# Patient Record
Sex: Female | Born: 1940 | Race: White | Hispanic: No | Smoking: Never smoker
Health system: Southern US, Community
[De-identification: ages and names within clinical notes are randomized; demographics above are authoritative.]

## PROBLEM LIST (undated history)

## (undated) DIAGNOSIS — G20A1 Parkinson's disease without dyskinesia, without mention of fluctuations: Secondary | ICD-10-CM

## (undated) DIAGNOSIS — K219 Gastro-esophageal reflux disease without esophagitis: Secondary | ICD-10-CM

## (undated) DIAGNOSIS — I10 Essential (primary) hypertension: Secondary | ICD-10-CM

## (undated) DIAGNOSIS — F0280 Dementia in other diseases classified elsewhere without behavioral disturbance: Secondary | ICD-10-CM

## (undated) DIAGNOSIS — E785 Hyperlipidemia, unspecified: Secondary | ICD-10-CM

## (undated) DIAGNOSIS — G20 Parkinson's disease: Secondary | ICD-10-CM

## (undated) DIAGNOSIS — F419 Anxiety disorder, unspecified: Secondary | ICD-10-CM

## (undated) DIAGNOSIS — I2510 Atherosclerotic heart disease of native coronary artery without angina pectoris: Secondary | ICD-10-CM

## (undated) HISTORY — DX: Parkinson's disease without dyskinesia, without mention of fluctuations: G20.A1

## (undated) HISTORY — PX: APPENDECTOMY: SHX54

## (undated) HISTORY — DX: Anxiety disorder, unspecified: F41.9

## (undated) HISTORY — DX: Parkinson's disease: G20

## (undated) HISTORY — DX: Gastro-esophageal reflux disease without esophagitis: K21.9

---

## 1961-12-11 HISTORY — PX: CHOLECYSTECTOMY: SHX55

## 1987-12-12 HISTORY — PX: OTHER SURGICAL HISTORY: SHX169

## 1998-11-19 ENCOUNTER — Ambulatory Visit (HOSPITAL_COMMUNITY): Admission: RE | Admit: 1998-11-19 | Discharge: 1998-11-19 | Payer: Self-pay | Admitting: Obstetrics & Gynecology

## 2000-06-05 ENCOUNTER — Encounter: Payer: Self-pay | Admitting: *Deleted

## 2000-06-05 ENCOUNTER — Encounter: Admission: RE | Admit: 2000-06-05 | Discharge: 2000-06-05 | Payer: Self-pay | Admitting: *Deleted

## 2000-06-29 ENCOUNTER — Encounter: Payer: Self-pay | Admitting: Emergency Medicine

## 2000-06-29 ENCOUNTER — Inpatient Hospital Stay (HOSPITAL_COMMUNITY): Admission: EM | Admit: 2000-06-29 | Discharge: 2000-07-02 | Payer: Self-pay | Admitting: Emergency Medicine

## 2000-07-02 HISTORY — PX: CARDIAC CATHETERIZATION: SHX172

## 2000-07-09 ENCOUNTER — Emergency Department (HOSPITAL_COMMUNITY): Admission: EM | Admit: 2000-07-09 | Discharge: 2000-07-09 | Payer: Self-pay | Admitting: Emergency Medicine

## 2000-07-29 ENCOUNTER — Emergency Department (HOSPITAL_COMMUNITY): Admission: EM | Admit: 2000-07-29 | Discharge: 2000-07-29 | Payer: Self-pay | Admitting: Emergency Medicine

## 2000-10-21 ENCOUNTER — Inpatient Hospital Stay (HOSPITAL_COMMUNITY): Admission: EM | Admit: 2000-10-21 | Discharge: 2000-10-24 | Payer: Self-pay | Admitting: Emergency Medicine

## 2000-10-21 ENCOUNTER — Encounter: Payer: Self-pay | Admitting: Emergency Medicine

## 2000-10-31 HISTORY — PX: CORONARY ANGIOPLASTY WITH STENT PLACEMENT: SHX49

## 2001-02-19 ENCOUNTER — Encounter: Payer: Self-pay | Admitting: Cardiovascular Disease

## 2001-02-19 ENCOUNTER — Inpatient Hospital Stay (HOSPITAL_COMMUNITY): Admission: EM | Admit: 2001-02-19 | Discharge: 2001-02-21 | Payer: Self-pay | Admitting: Emergency Medicine

## 2001-02-20 HISTORY — PX: CARDIAC CATHETERIZATION: SHX172

## 2006-06-15 ENCOUNTER — Observation Stay (HOSPITAL_COMMUNITY): Admission: EM | Admit: 2006-06-15 | Discharge: 2006-06-18 | Payer: Self-pay | Admitting: *Deleted

## 2006-06-18 HISTORY — PX: CARDIAC CATHETERIZATION: SHX172

## 2006-07-04 ENCOUNTER — Ambulatory Visit: Payer: Self-pay | Admitting: Internal Medicine

## 2006-07-05 ENCOUNTER — Ambulatory Visit: Payer: Self-pay | Admitting: Internal Medicine

## 2007-02-01 ENCOUNTER — Encounter: Admission: RE | Admit: 2007-02-01 | Discharge: 2007-02-01 | Payer: Self-pay | Admitting: Family Medicine

## 2008-03-19 ENCOUNTER — Encounter: Admission: RE | Admit: 2008-03-19 | Discharge: 2008-03-19 | Payer: Self-pay | Admitting: Family Medicine

## 2009-06-23 ENCOUNTER — Encounter: Admission: RE | Admit: 2009-06-23 | Discharge: 2009-06-23 | Payer: Self-pay | Admitting: Internal Medicine

## 2010-08-10 ENCOUNTER — Inpatient Hospital Stay (HOSPITAL_COMMUNITY): Admission: EM | Admit: 2010-08-10 | Discharge: 2010-08-10 | Payer: Self-pay | Admitting: Emergency Medicine

## 2010-08-10 HISTORY — PX: CARDIAC CATHETERIZATION: SHX172

## 2010-08-25 ENCOUNTER — Ambulatory Visit (HOSPITAL_COMMUNITY): Admission: RE | Admit: 2010-08-25 | Discharge: 2010-08-25 | Payer: Self-pay | Admitting: Internal Medicine

## 2010-12-11 HISTORY — PX: NM MYOCAR PERF WALL MOTION: HXRAD629

## 2010-12-11 HISTORY — PX: TRANSTHORACIC ECHOCARDIOGRAM: SHX275

## 2011-01-02 ENCOUNTER — Encounter
Admission: RE | Admit: 2011-01-02 | Discharge: 2011-01-02 | Payer: Self-pay | Source: Home / Self Care | Attending: Internal Medicine | Admitting: Internal Medicine

## 2011-02-23 LAB — CK TOTAL AND CKMB (NOT AT ARMC)
CK, MB: 2.9 ng/mL (ref 0.3–4.0)
Relative Index: INVALID (ref 0.0–2.5)
Total CK: 44 U/L (ref 7–177)

## 2011-02-23 LAB — HEPATIC FUNCTION PANEL
Albumin: 3.7 g/dL (ref 3.5–5.2)
Alkaline Phosphatase: 91 U/L (ref 39–117)
Total Bilirubin: 0.5 mg/dL (ref 0.3–1.2)
Total Protein: 7.3 g/dL (ref 6.0–8.3)

## 2011-02-23 LAB — TROPONIN I: Troponin I: 0.01 ng/mL (ref 0.00–0.06)

## 2011-02-23 LAB — URINE CULTURE: Culture  Setup Time: 201108310100

## 2011-02-23 LAB — URINALYSIS, ROUTINE W REFLEX MICROSCOPIC
Bilirubin Urine: NEGATIVE
Ketones, ur: NEGATIVE mg/dL
Nitrite: NEGATIVE
Specific Gravity, Urine: 1.009 (ref 1.005–1.030)
Urobilinogen, UA: 0.2 mg/dL (ref 0.0–1.0)
pH: 5.5 (ref 5.0–8.0)

## 2011-02-23 LAB — APTT: aPTT: 29 seconds (ref 24–37)

## 2011-02-23 LAB — URINE MICROSCOPIC-ADD ON

## 2011-02-23 LAB — MRSA PCR SCREENING: MRSA by PCR: NEGATIVE

## 2011-02-23 LAB — POCT CARDIAC MARKERS: CKMB, poc: 1.3 ng/mL (ref 1.0–8.0)

## 2011-02-23 LAB — BASIC METABOLIC PANEL
BUN: 13 mg/dL (ref 6–23)
CO2: 24 mEq/L (ref 19–32)
Chloride: 107 mEq/L (ref 96–112)
Creatinine, Ser: 0.83 mg/dL (ref 0.4–1.2)
GFR calc Af Amer: >60 mL/min (ref >60)
Glucose, Bld: 247 mg/dL — ABNORMAL HIGH (ref 70–99)
Potassium: 3.9 mEq/L (ref 3.5–5.1)
Sodium: 138 mEq/L (ref 135–145)

## 2011-02-23 LAB — PROTIME-INR
INR: 1 (ref 0.00–1.49)
Prothrombin Time: 13.4 seconds (ref 11.6–15.2)

## 2011-02-23 LAB — CARDIAC PANEL(CRET KIN+CKTOT+MB+TROPI)
CK, MB: 2.5 ng/mL (ref 0.3–4.0)
Relative Index: INVALID (ref 0.0–2.5)
Total CK: 50 U/L (ref 7–177)
Total CK: 54 U/L (ref 7–177)

## 2011-02-23 LAB — GLUCOSE, CAPILLARY: Glucose-Capillary: 151 mg/dL — ABNORMAL HIGH (ref 70–99)

## 2011-02-23 LAB — CBC
HCT: 35.7 % — ABNORMAL LOW (ref 36.0–46.0)
MCH: 28 pg (ref 26.0–34.0)
MCV: 86 fL (ref 78.0–100.0)
WBC: 10.2 10*3/uL (ref 4.0–10.5)

## 2011-02-23 LAB — LIPID PANEL
LDL Cholesterol: 63 mg/dL (ref 0–99)
VLDL: 18 mg/dL (ref 0–40)

## 2011-02-23 LAB — HEMOGLOBIN A1C
Hgb A1c MFr Bld: 7.1 % — ABNORMAL HIGH (ref <5.7)
Mean Plasma Glucose: 157 mg/dL — ABNORMAL HIGH (ref <117)

## 2011-04-25 ENCOUNTER — Ambulatory Visit (HOSPITAL_COMMUNITY): Payer: Medicare Other

## 2011-04-25 ENCOUNTER — Observation Stay (HOSPITAL_COMMUNITY)
Admission: AD | Admit: 2011-04-25 | Discharge: 2011-04-26 | Disposition: A | Discharge status: Home / Self Care | Payer: Medicare Other | Source: Ambulatory Visit | Attending: Orthopaedic Surgery | Admitting: Orthopaedic Surgery

## 2011-04-25 DIAGNOSIS — S61409A Unspecified open wound of unspecified hand, initial encounter: Secondary | ICD-10-CM | POA: Insufficient documentation

## 2011-04-25 DIAGNOSIS — K219 Gastro-esophageal reflux disease without esophagitis: Secondary | ICD-10-CM | POA: Insufficient documentation

## 2011-04-25 DIAGNOSIS — Y92009 Unspecified place in unspecified non-institutional (private) residence as the place of occurrence of the external cause: Secondary | ICD-10-CM | POA: Insufficient documentation

## 2011-04-25 DIAGNOSIS — L02519 Cutaneous abscess of unspecified hand: Principal | ICD-10-CM | POA: Insufficient documentation

## 2011-04-25 DIAGNOSIS — IMO0001 Reserved for inherently not codable concepts without codable children: Secondary | ICD-10-CM | POA: Insufficient documentation

## 2011-04-25 DIAGNOSIS — I10 Essential (primary) hypertension: Secondary | ICD-10-CM | POA: Insufficient documentation

## 2011-04-25 DIAGNOSIS — E669 Obesity, unspecified: Secondary | ICD-10-CM | POA: Insufficient documentation

## 2011-04-25 DIAGNOSIS — Z23 Encounter for immunization: Secondary | ICD-10-CM | POA: Insufficient documentation

## 2011-04-25 DIAGNOSIS — E119 Type 2 diabetes mellitus without complications: Secondary | ICD-10-CM | POA: Insufficient documentation

## 2011-04-25 LAB — GRAM STAIN

## 2011-04-26 LAB — GLUCOSE, CAPILLARY: Glucose-Capillary: 159 mg/dL — ABNORMAL HIGH (ref 70–99)

## 2011-04-26 LAB — BASIC METABOLIC PANEL
BUN: 10 mg/dL (ref 6–23)
Chloride: 103 mEq/L (ref 96–112)
Glucose, Bld: 155 mg/dL — ABNORMAL HIGH (ref 70–99)
Potassium: 3.7 mEq/L (ref 3.5–5.1)

## 2011-04-26 LAB — CBC
HCT: 35.3 % — ABNORMAL LOW (ref 36.0–46.0)
MCV: 85.3 fL (ref 78.0–100.0)
RDW: 14.3 % (ref 11.5–15.5)
WBC: 8.4 10*3/uL (ref 4.0–10.5)

## 2011-04-28 LAB — WOUND CULTURE: Culture: NO GROWTH

## 2011-04-28 NOTE — Cardiovascular Report (Signed)
[name], [name]                  ACCOUNT NO.:  [id number]   MEDICAL RECORD NO.:  [id number]          PATIENT TYPE:  INP   LOCATION:  3706                         FACILITY:  [facility]   PHYSICIAN:  Madaline Savage, M.D.DATE OF BIRTH:  [birth year]   DATE OF PROCEDURE:  06/18/2006  DATE OF DISCHARGE:                              CARDIAC CATHETERIZATION   PROCEDURES PERFORMED:  1.  Selective coronary angiography by Judkins technique (inpatient).  2.  Retrograde left heart catheterization.  3.  Left ventricular angiography.  4.  Successful right percutaneous femoral artery Angio-Seal closure.   ENTRY SITE:  Right femoral.   DYE USED:  Omnipaque.   CATHETERS USED:  6-French catheters.  There were three catheters used on the  right coronary artery.  The first used was a 6-French diagnostic catheter  which caused damping of pressure, then a 4-French right Judkins diagnostic  catheter caused the same event.  Finally, a 6-French guide with side holes  was able to successfully intubate the ostium without causing damping of  pressure.   PATIENT PROFILE:  [name] is a pleasant 70 year old white female patient of  Dr. Dara Hoyer and a cardiology patient of Dr. Nicki Guadalajara who is  diabetic with a history of gastroesophageal reflux disorder, mild obesity  and also has had a history of LAD stenting in 2001.  At that time a 3.0 x 15  mm NIR stent was placed in her mid-LAD just beyond septal perforator branch  #1.   Prior to this admission on June 15, 2006, the patient had had chest pain  symptomatology very suggestive of angina that prompted her to present to the  emergency room where she was admitted.  She had had a stress test in May  2005 showing an ejection fraction greater than 65% without any evidence of  ischemia, and there was trivial aortic regurgitation.  The patient does have  a history of anxiety and depression.   RESULTS:  Left ventricular pressure was 145/7, end-diastolic  pressure 15.  Central aortic pressure 145/47, mean of 95.  No aortic valve gradient by  pullback technique.   ANGIOGRAPHIC RESULTS:  The patient's coronary arterial tree showed no  evidence of coronary valvular or pericardial calcification.   Anatomically, the left main coronary artery was a long, medium-in-size blood  vessel with a smooth lumen throughout.  I would estimate the length of this  vessel to be between 12 mm and 15 mm.  The left circumflex coronary artery  is the dominant vessel of the circulation and it gives rise to a posterior  descending branch.   LAD contains a radio-opaque stent just beyond the takeoff of a septal  perforator branch.  There is no encroachment onto the septal perforator  branch by the patent stent.  The patent stent looked pristine.  There is  TIMI III flow.  There is zero evidence of any stenosis before the stent or  after the stent.  Were it not for the radiopacity of the stent, one would  not be able to tell it was sitting in the mid-LAD.  There is brisk flow into  the distal LAD and into a bifurcating diagonal branch which arises half the  way down the LAD.   Circumflex gives rise to a very large first obtuse marginal branch arising  in the proximal one-third of the circumflex, then a tiny second obtuse  marginal branch arises just before a tiny atrial circumflex branch.  A third  obtuse marginal branch arises just after an atrial circumflex branch, and  then the distal circumflex bifurcates into a posterior descending and  posterolateral branch and no lesions are seen throughout the circumflex  system.   The right coronary artery was a very small, nondominant vessel in the  circulation.  As was previously described, two diagnostic catheters (6-  [place] and 4-French) were unable to cannulate this vessel without causing  damping of pressure.  I was then able to use a right coronary artery guide  catheter, 6-French with side holes, and was  fortunately able to locate a  position that would show separate ostia for a sinus node branch and an RCA.  This RCA was small and nondominant.  There were no lesions throughout this  nondominant vessel.   Left ventricular angiography showed very mild anterolateral wall hypokinesis  with remaining segments looking normal.  I would estimate ejection fraction  at 50%.  No LV thrombus is noted.  No mitral regurgitation done.   I did a right femoral artery angiogram and it showed stable positioning of a  right femoral artery 6-French catheter for which an Angio-Seal closure was  successfully performed.   FINAL DIAGNOSES:  1.  Recent episodes of chest pain very consistent with angina.  2.  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  3.  Widely patent left anterior descending coronary artery stent with no      other new lesions seen and no evidence of a 40% inflow narrowing in this      stent.  4.  Dominant circumflex coronary artery.  5.  Small nondominant right coronary artery without lesions.  6.  Low-normal left ventricular systolic function, ejection fraction 50%.   PLAN:  Continued medical therapy and reassurance the patient.           ______________________________  Madaline Savage, M.D.     WHG/MEDQ  D:  06/18/2006  T:  06/18/2006  Job:  [id number]   cc:   Teena Irani. Arlyce Dice, M.D.  Fax: [phone]   Nicki Guadalajara, M.D.  Fax: [phone]   [facility]

## 2011-04-28 NOTE — Assessment & Plan Note (Signed)
[facility]                               PULMONARY OFFICE NOTE   NAME:[name], [name]                         MRN:          [id number]  DATE:07/04/2006                            DOB:          [birth year]    HISTORY:  A 70 year old white female with gradual onset variable dyspnea  that occurs more with stress than actually with walking, but states that it  is reproducible walking less than a block now.  Previously, she had had  dyspnea and chest discomfort that seemed to resolve after angioplasty in  2001, but with this most recent exacerbation underwent a left heart  catheterization which showed no evidence of any culprit lesions, normal LV  function, and an end-diastolic pressure of only 15 at cardiac  catheterization dated June 18, 2006.  Now is seen at Dr. Landry Dyke request for  evaluation of unexplained dyspnea.   The patient admits that she has gained weight since she used to walk with  her husband.  (Apparently, he became sick and could not walk anymore about  eight months ago.)  She stopped walking for a while, gained about 10-15  pounds, and now when she tries to do the same thing she did before she  gives out easily. She also complains of fatigue, but no orthopnea, PND, or  leg swelling, overt cough, fevers, chills, pleuritic or exertional chest  pain, or overt sinus or reflux symptoms.   PAST MEDICAL HISTORY:  1.  Diabetes.  2.  Ischemic heart disease.  3.  Hyperlipidemia.   ALLERGIES:  ASPIRIN.   MEDICATIONS:  Recorded in detail in the worksheet, dated July 04, 2006,  significant for the absence of pulmonary medicines.   SOCIAL HISTORY:  She has never smoked.  She is self-employed, with no  unusual travel, pet, or hobby exposure.   FAMILY HISTORY:  Recorded in detail, significant for the absence of  respiratory disease.   REVIEW OF SYSTEMS:  Taken in detail, and is significant only for the  dyspnea.  Note, she denies any chest pain  on this occasion.   PHYSICAL EXAMINATION:  GENERAL:  She is a slightly anxious, pleasant  ambulatory white female in no acute distress.  VITAL SIGNS:  Afebrile.  Normal vital signs.  Weight is 211 pounds.  HEENT:  Unremarkable. Oropharynx clear.  NECK:  Supple, without cervical adenopathy or tenderness.  Trachea is  midline.  Otherwise negative.  LUNG FIELDS:  Perfectly clear bilaterally to auscultation and percussion.  HEART:  Regular rate and rhythm, without murmur, gallop, rub.  ABDOMEN:  Soft benign.  EXTREMITIES:  Warm, without calf tenderness, cyanosis, clubbing, or edema.   IMPRESSION:  Dyspnea that has occurred with weight gain, with exertion.  It  is probably all related to deconditioning.  I have seen a number of patients  with just 10-15 pounds of weight gain who however have disproportionate  dyspnea related to reflux that occurs with weight gain.  I therefore gave  her free samples of Nexium to take for 6 weeks to see if this helps her  dyspnea  that occurs at rest with stress.  This brings up the issue of  whether anxiety may also be playing a role in her symptoms, but of course  would not be reproducible with exercise, where sometimes reflux-induced  dyspnea is indeed reproducible with exercise.   I did review with her also an exercise strategy that will have her walking  at a level where she is short of breath but not out of breath for up to 30  minutes daily.   I did recommend a CT scan of the chest to rule out occult pulmonary emboli,  though I think this is very unlikely based on the gradual onset and overall  pattern of her symptoms.                                   Charlaine Dalton. Sherene Sires, MD, [facility]   MBW/MedQ  DD:  07/05/2006  DT:  07/05/2006  Job #:  [id number]   cc:   Nicki Guadalajara, MD

## 2011-04-28 NOTE — Cardiovascular Report (Signed)
[place]  Patient:    [name], [name]                         MRN: [id number] Proc. Date: 07/02/00 Adm. Date:  [id number] Attending:  Berry, Jonathan [place] CC:         Lennette Bihari, M.D.             Orville Govern, At her office.             Cardiac Catheterization Laboratory                        Cardiac Catheterization  INDICATIONS:  [name] is a 70 year old, very pleasant white female, who was admitted to [facility] on Friday evening with 2-hour episode of substernal chest pressure.  She developed her chest pressure after walking on a treadmill.  For the two months previously, she has noticed a decline in exercise tolerance with exertional dyspnea.  She was admitted with a diagnosis of possible unstable angina.  She was treated with nitroglycerin, Plavix, heparin, as well as, beta-blocker therapy.  She does have an ALLERGY TO ASPIRIN.  She is now referred for definitive diagnostic catheterization.  HEMODYNAMIC DATA:  Central aortic pressure 127/56, left ventricular pressure 127/14.  ANGIOGRAPHIC DATA:  Left main coronary artery was a long, normal vessel that bifurcated into a LAD and left circumflex system.  The LAD gave rise to a very proximal first diagonal vessel.  Between the first and second diagonal vessels, there was initial narrowing of approximately 40%. Nitroglycerin was administered.  The vessel dilated significantly, but following nitroglycerin, the stenosis appeared to be approximately 50%.  There was TIMI 3 flow distally.  There were two additional diagonal vessels that were normal.  The circumflex vessel was a dominant vessel that gave rise to a small proximal marginal vessel, two major obtuse marginal vessels and ended in the posterolateral coronary artery.  The right coronary artery was a small vessel that gave rise to a small PDA system.  Biplane cineangiography revealed preserved global LV  function without focal segmental wall motion abnormality.  DISTAL AORTOGRAPHY:  Did not demonstrate any significant aortoiliac disease. There was no evidence for renal artery stenosis.  Following the catheterization, the right groin was closed successfully with the new updated Perclose device without difficulty and without complications. Excellent hemostasis was obtained.  IMPRESSION: 1. Normal LV function. 2. A 40-50% proximal, smooth LAD stenosis between the first and second    diagonal vessels. 3. Normal circumflex and right coronary artery.  RECOMMENDATION:  Medical therapy.  The patient may be a candidate for the PROVE-IT trial. DD:  07/02/00 TD:  07/02/00 Job: [id number]

## 2011-04-28 NOTE — Discharge Summary (Signed)
[place]  Patient:    [name]                          MRN: [id number] Adm. Date:  [id number] Disc. Date: [id number] Attending:  Lorre Nick Dictator:   [facility] [place], [place].A.                           Discharge Summary  ADMISSION DIAGNOSES: 1. Chest pain, unstable angina. 2. History of remote esophagitis approximately 12-15 years ago. 3. Arthritis. 4. History of hysterectomy. 5. History of cholecystectomy. 6. History of appendectomy.  DISCHARGE DIAGNOSES: 1. Chest pain, unstable angina. 2. History of remote esophagitis approximately 12-15 years ago. 3. Arthritis. 4. History of hysterectomy. 5. History of cholecystectomy. 6. History of appendectomy. 7. Status post cardiac catheterization on July 02, 2000, by Lennette Bihari,    M.D., revealing 40-50% stenosis of the mid left anterior descending,    otherwise normal coronary arteries.  HISTORY OF PRESENT ILLNESS:  [name] is a 70 year old white female with no prior cardiac history, no hypertension, no hyperlipidemia, no diabetes mellitus, who presented to the [facility] ER on June 29, 2000, with complaints of chest pain.  That morning, she had exercised for 20 minutes on her treadmill and stationary bike.  She then developed pressure in the middle of her chest and also had shortness of breath.  As well, she had increased fatigue.  She had "tingling all over."  There was no diaphoresis, no vomiting, and no vomiting.  She took no medications.  She did have some increased belching.  She had eaten a meal at approximately 7 a.m. and then began to exercise at 8:30 a.m.  She continued to have shortness of breath and chest pain until after arriving in the ER.  She stated that for the past three to four months, she had had exertional chest tightness and dyspnea on exertion.  She denied having any cardiac testing in the past.  She does have a history of esophagitis  approximately 12-15 years ago.  She states that those symptoms were very different than what she was experiencing at this time.  She states that she exercises pretty regularly and has had progressively worsening symptoms with exercise.  As well, she had had some lower extremity edema for approximately one year, but this did increase over the past two to three months.  On exam at that time, she was hemodynamically stable with a heart rate of 69, a blood pressure of 115/59.  Exam was essentially benign.  EKG revealed normal sinus rhythm, 86 beats per minute, with no ST-T wave changes.  Her initial set of enzymes at that time was negative.  At that time, [name] was planned for admission to telemetry to check serial enzymes to rule out MI.  Though she had no real cardiac risk factors and her EKG was without change and enzymes negative, it was felt that she should probably undergo cardiac catheterization given her symptomology that was consistent with crescendo angina.  Also of note, in the ER, she did have a negative spiral CT to rule out PE.  HOSPITAL COURSE:  On June 30, 2000, [name] had no further chest pain.  She was planned for cardiac catheterization on Monday.  She was continued on IV heparin, IV nitroglycerin, Plavix, and Lopressor.  Of note, she did have an allergy  to aspirin.  She states that aspirin causes her to "swell."  Again on July 01, 2000, [name] was stable with no further chest pain and was awaiting cardiac catheterization the following morning.  On July 02, 2000, [name] underwent cardiac catheterization by Lennette Bihari, M.D.  She was found to have a 40-50% proximal to mid LAD stenosis. Otherwise she had normal coronary arteries.  There was normal LV function and no PVD upon catheterization.  Later in the day on July 02, 2000, [name] was stable for discharge.  Her groin was stable.  Lennette Bihari, M.D., planned to start Zocor 20 mg q.h.s. given her lipid  profile.  As well, he planned for Toprol XL 50 mg a day.  We will give her sublingual nitroglycerin.  We plan a follow-up office visit in several weeks and add Protonix for GI prophylaxis.  CONSULTS:  None.  PROCEDURES:  Cardiac catheterization on July 02, 2000, by Lennette Bihari, M.D., revealing a 40-50% proximal to mid LAD stenosis, otherwise normal coronaries.  LABORATORY DATA:  TSH normal at 4.120.  The lipid profile revealed a total cholesterol of 209, triglycerides 126, HDL 44, and LDL 140.  Cardiac enzymes negative with CKs of 60, 35, and 40, CK-MBs 1.7, 1.5, and 1.6, and troponins less than 0.03 x 3.  The metabolic profile was normal with sodium 140, potassium 3.8, glucose 161, BUN 17, and creatinine 0.7.  Liver function tests were all normal.  The CBC was normal with WBC 8.2, hemoglobin 13.4, hematocrit 37.7, and platelets 339.  The EKG revealed a normal sinus rhythm at 70 beats per minute with no ST-T changes.  DISCHARGE MEDICATIONS: 1. Zocor 20 mg one p.o. q.h.s. 2. Toprol XL 50 mg one p.o. q.d. 3. Protonix 40 mg one p.o. q.d. 4. Nitroglycerin 0.4 mg sublingual as directed. 5. Estraderm patch.  ACTIVITY:  No strenuous activity, lifting greater than 5 pounds, driving, or sexual activity for three days.  DIET:  A low-salt, low-fat diet.  WOUND CARE:  May gently wash with the groin with warm water and soap.  Do not sit in a tub of water or get in a pool for five to seven days.  Call the office at [phone] if any bleeding or increased size or pain in the groin.  FOLLOW-UP:  Follow up in the [place], [place], office by Lennette Bihari, M.D., on September 11, 2000, at 11:30 a.m. DD:  07/16/00 TD:  07/17/00 Job: [id number]

## 2011-04-28 NOTE — Discharge Summary (Signed)
[place]. [facility]  Patient:    Andrea, ANCHONDO                         MRN: [id number] Adm. Date:  10/21/00 Disc. Date: 10/24/00 Attending:  Deneen Harts Dictator:   Halford Decamp. Delanna Ahmadi, R.N., N.P. CC:         Lennette Bihari, M.D.  Dr. Selena Lesser, M.D.   Discharge Summary  HISTORY OF PRESENT ILLNESS:   [name] is a 70 year old married female patient of Dr. Tresa Endo who came in to the ER by EMS secondary to shortness of breath symptoms.  She has had trouble for three hours, then she had burning discomfort in her chest which she never had before.  Her heart felt like it was skipping a beat, then both legs started to burn.  She denied any chest pain, chest tightness, and burning.  She stated that she had a catheterization in July with 40 to 50% blockages.  However, since that time, she has not been able to climb steps or shop at the store because of her shortness of breath symptoms.  She denies any anxiety.  Her symptoms started occurring today after vacuuming.  HOSPITAL COURSE:  She was admitted for further evaluation, possible cardiac catheterization, Protonix.  Plavix was added.  Norvasc was also added, and she was put on Lovenox.  She underwent cardiac catheterization on October 22, 2000, which showed a 65% LAD.  She only had single-vessel disease.  All other vessels were normal.  She underwent cutting balloon procedure with stent placement.  She received IV Aggrastat and heparin.  She had no complications during her procedure.  Postprocedure, she had some nausea with her sheath pull, and she was given Phenergan.  Her blood pressure also dropped.  She was asymptomatic.  Her CK-MBs elevated slightly at 43 and 3.3; troponin I was 0.15.  She got up and walked to cardiac rehabilitation, and she apparently had shortness of breath symptoms again.  Her family, specifically one of her daughters, became very aggravated and frustrated  with her mothers symptoms and demanded a further workup be done.  GI consult was called.  She was seen by Dr. Kinnie Scales.  EGD was performed which was normal.  She tolerated it without any difficulty.  He felt that she probably had symptomatic GE acid reflux without any endoscopic findings.  She does complain about a cough and some reflux symptoms.  He recommended Protonix 40 mg every day, office visit with him in a month, and that she should have antireflux methods.  Dr. Elsie Lincoln saw her on October 24, 2000.  He felt that she should be tried on Imdur every day.  She does not use nitroglycerin sublingual secondary to the fact that it bottoms out her blood pressure.  She was considered stable to be discharged home.  LABORATORY DATA:  On November 11, her WBC was 10.5, hemoglobin 14, hematocrit 41.  On November 13, her hemoglobin was 12.1, hematocrit 33, and WBC 6.9. Sodium 138, potassium 3.6, glucose 112, BUN 12, creatinine 0.7.  CK-MBs were negative x 2.  Postprocedure, she had a CK of 43, MB 3.3, and troponin 0.15, not thought to be significant.  She only had a total cholesterol drawn which was 183.  She had H. pylori done which was 1.42; it was a serum test which showed that it was positive suggestive of previous exposure or active infection.  DISCHARGE MEDICATIONS: 1. Atenolol 12.5 mg once per day. 2. Norvasc 2.5 mg once per day. 3. Protonix 40 mg once per day. 4. Plavix 75 mg once per day. 5. Effexor XR 37.5 mg once per day. 6. Vitamin E 400 international units every day. 7. Vitamin C 500 mg every day. 8. Isosorbide mononitrate 30 mg every day.  ACTIVITY:  She is to do no strenuous activity, no driving or sexual activity x 4 days.  DIET:  Low fat.  DISCHARGE INSTRUCTIONS:  If she has any problems with her groin, she will call.  FOLLOWUP:  She has an appointment on November 23 at 8:30.  She will follow up with Dr. Tresa Endo on November 26 at 3:40.  DISCHARGE DIAGNOSES: 1.  Unstable angina. 2. Atherosclerotic cardiovascular disease status post cutting balloon and    stent placement to a mid left anterior descending artery lesion.  She has    only single-vessel disease. 3. Gastroesophageal reflux disease symptoms. 4. Anxiety. 5. Hyperlipidemia. DD:  11/11/00 TD:  11/12/00 Job: [id number]

## 2011-04-28 NOTE — Cardiovascular Report (Signed)
[place]. [facility]  Patient:    [name], [name]                         MRN: [id number] Adm. Date:  [id number] Attending:  Virgina Evener CC:         Orville Govern, Office  Teena Irani. Arlyce Dice, M.D.   Cardiac Catheterization  INDICATIONS:  Ms. Betsabe Iglesia is a 70 year old white female who, on November 1, underwent PCI with stenting of her LAD.  At that time, the patient had presented with recurrent chest pain.  Prior catheterization had shown 50-60% LAD stenoses.  She developed recurrent chest pain in November leading to repeat catheterization and was found to have about a 60-70% LAD lesion.  Her symptoms improved following stenting which was done by Dr. Elsie Lincoln.  The patient had done well until recently.  For the past month, she has noticed chest burning.  Her symptoms have been somewhat atypical.  Yesterday, she was seen in the office as a walk-in add-on after experiencing tremors in the middle of the night with chest burning.  She had had recurrent episodes of chest burning throughout the day and was hospitalized last night with presumptive diagnosis of somewhat atypical but possible unstable angina.  ECG was nondiagnostic.  She is now referred for catheterization.  HEMODYNAMIC DATA:  Central aortic pressure was 105/57.  Left ventricular pressure was 105/17.  ANGIOGRAPHIC DATA: 1. The left main coronary artery was a normal vessel that bifurcated into an    LAD and dominant left circumflex system. 2. The LAD had a stent present proximally and seemed to be juxtaposed at the    takeoff of the septal perforating artery and proximal to the first diagonal    vessel.  There was a narrowing of approximately 40-50% just proximal to    this stents proximal segment.  There also was 80% narrowing in a small    septal perforating artery.  There was no significant in-stent restenosis in    the stent.  The remainder of the LAD and diagonal was angiographically  normal. 3. The circumflex was a dominant vessel, was angiographically normal and ended    in a PDA and PLA system. 4. The right coronary artery was a nondominant and normal vessel.  Bi-plane left cineangiography revealed normal LV function.  At this point, because of the patients recurrent chest pain yesterday and during the evening, it was decided to perform intravascular ultrasound to further delineate the LAD lesion and to make certain there was no significant in-stent restenosis.  Heparin, 2000 units, was administered after the patients ACT was documented to be 142.  Initially, a 6-French Q-curve was used, but this wiring the LAD was unsuccessful with this guide, and an FL3.5 left 6-French guide was used.  A short Patriot wire was advanced down the LAD An Atlantis Plus intravascular ultrasound catheter was then inserted.  An IVUS run was performed.  This revealed no significant in-stent restenosis in the 3.0- x 15-mm NIR Elite stent.  There was mild luminal intimal hyperplasia in the proximal portion of the stent, not felt to be significant.  Just proximal to the stent, there was somewhat eccentric plaque.  When this was measured in diameter, the lumen proved to be 2.8- x 2.8-mm in size.  The inner diameter of the vessel was proved to be 4.7.  The percent area of stenosis was 66%.  The percent diameter stenosis was 43%.  With  this still somewhat large residual lumen size, it was felt that this should not be flow-limiting.  The decision was made not to perform intervention but to treat the patient medically.  The arterial sheath was removed.  The patient tolerated the procedure well and returned to her room in satisfactory condition.  IMPRESSION: 1. Normal LV function. 2. Approximately 50% stenosis just proximal to the proximal 3.5- x 15-mm stent    without significant in-stent restenosis, and evidence for 80% stenosis in a    diminutive first septal perforating artery. 3. Normal  left circumflex coronary artery. 4. Normal nondominant right coronary artery.  RECOMMENDATIONS:  Medical therapy.  DD:  02/20/01 TD:  02/20/01 Job: [id number]

## 2011-04-28 NOTE — Discharge Summary (Signed)
[place]. [facility]  Patient:    [name], [name]                         MRN: [id number] Adm. Date:  [id number] Disc. Date: [id number] Attending:  Virgina Evener Dictator:   Halford Decamp Delanna Ahmadi, R.N., N.P. CC:         Teena Irani. Arlyce Dice, M.D.   Discharge Summary  Mrs. [name] is a 70 year old married white female patient of Dr.  Nicki Guadalajara with known single vessel coronary artery disease.  She is status post LAD, PCI in November 2001 by Dr. Elsie Lincoln.  She has had recurrent chest pain since then.  She had a Cardiolite on December 10, 2000 which was negative for ischemia.  Her EF was 71%.  Apparently she has been complaining about nocturnal chest tightness and burning waking her up at 2 a.m. lasting for about 30 minutes.  It is usually relieved by Xanax and half an Imdur and belching. Occasional right arm discomfort.  It is not associated with increased activity.  No presyncope.  No nausea, vomiting and no shortness of breath.  Her hands get sweaty.  These are similar symptoms prior to her angioplasty on November 2001.  She was admitted to rule out an MI.  To note, she has had a complete GI workup recently by Dr. Kinnie Scales without any abnormalities found.  She was seen by Dr. Tresa Endo in the emergency room, admitted, started on IV heparin, IV nitroglycerin and her enzymes were checked.  They apparently came back negative.  She underwent cardiac catheterization on January 23, 2001 by Dr. Nicki Guadalajara.  She had IV ultrasound performed.  She had no significant instant restenosis.  She did have some plaque just proximal to her stent anywhere from 40 to 60% stenosis. Medical treatment was recommended by Dr. Tresa Endo.  She was seen by Dr. Tresa Endo on February 21, 2001 and considered stable to be discharged home.  She is not to be on any aspirin for the known aspirin allergy.  DISCHARGE MEDICATIONS:  Norvasc 5 mg once per day.  Imdur 30 mg once per day. Prevacid 30 mg twice per  day.  Effexor 75 mg once per day.  Atenolol 12.5 mg per day.  Plavix 75 mg once per day.  She is to do no strenuous activity.  No lifting.  No driving for four days. She is to be on a low fat diet.  She will follow up with Dr. Tresa Endo on March 19, 2001.  DISCHARGE LABORATORY DATA:  Hemoglobin 11.7, hematocrit 34.2, platelets 272, WBC 5.7.  Sodium 138, potassium 3.6, glucose 115, BUN 10, creatinine 0.8.  CK MB #1 48/1.7, #2 34/1.3, troponin 0.2, #3 33/1.0 with troponin of 0.01.  TSH was 1.65.  DISCHARGE DIAGNOSES: 1. Chest pain ischemic related. 2. Arteriosclerotic cardiovascular disease status post left anterior    descending, PCI November 2001. 3. Recurrent chest pain with follow up Cardiolite December 10, 2001 with    no ischemia this admission, status post cardiac catheterization with    no significant instant restenosis with ultrasound.  She did have    40 to 60% stenosis prior to the stent in her left anterior descending.    Her other vessels have no coronary artery disease. 4. Normal ejection fraction. 5. Anxiety. 6. Depression. 7. Gastroesophageal reflux disease. 8. Normal left ventricular function. 9. ASPIRIN allergy.DD:  03/14/01 TD:  03/15/01 Job: [id number]

## 2011-04-28 NOTE — Cardiovascular Report (Signed)
[place]. [facility]  Patient:    [name], [name]                         MRN: [id number] Proc. Date: 10/22/00 Adm. Date:  [id number] Attending:  Virgina Evener CC:         Lennette Bihari, M.D.  Cath Lab   Cardiac Catheterization  PROCEDURE: 1. Intracoronary artery stent deployment into the mid left anterior descending    coronary artery. 2. Predilatation of lesion in mid-LAD with cutting balloon angioplasty. 3. Intravascular ultrasound of the left anterior descending coronary artery    before and after percutaneous intervention. 4. Selective coronary angiography by Judkins technique. 5. Retrograde left heart catheterization. 6. Left ventricular angiography.  COMPLICATIONS:  None.  ENTRY SITE:  Right femoral dye used, Omnipaque for diagnostic cath, and Hexabrix for interventional procedure.  RESULTS:  PRESSURES:  The left ventricular pressure 125/17, central aortic pressure 125/60, mean of 90, no aortic valve gradient by pullback technique.  DESCRIPTION OF PROCEDURE:  This procedure was performed by a right percutaneous femoral approach.  Cardiac catheterization was performed with a 6 French introducer sheath and the cutting balloon angioplasty portion of the procedure was also done with a 6 French sheath in place with a 6 French left Judkins guide.  The stent portion of the procedure was performed with a 7 [place] introducer sheath, a 6 French left Q curved catheter and a Patriot wire.  The procedure consisted of first performing selective coronary angiography and finding an eccentric type of plaque just beyond the septal perforator branch seen best in a 26 degree RAO projection with 13 degrees of cranial angulation.  After completion of diagnostic cardiac catheterization, intravascular ultrasound was performed.  The distal LAD appeared to be a 3.0 vessel.  The stenotic area in the mid-LAD was 2.2 to 2.6 and contained eccentric plaque at 8  oclock which was fairly dense.  The proximal LAD near the left main was a 4.0 vessel.  The IVUS tape was reviewed with Dr. Jenne Campus and he agreed that although this lesion was fairly borderline in terms of an area stenosis of 66% that based on the patients presentation and the lack of any known stress test information that we should go ahead and proceed with the intervention which was done.  The patient was given Aggrastat and weight-adjusted heparin and an ACT was confirmed at approximately 266. Although, one would predict that the LAD would be easily to steer a wire into, it turned out to be a very much a right angle turn off of the left main that required putting an exaggerated J-configuration in the tip of the guide wire. I completed a cutting balloon angioplasty with a 3.0 x 15 mm device and got a fairly good result, but when I tried to exchange that device for a stent, the entire system came dislodged.  I decided to start over with a 7 [place] system which provided much better backup support and it proved to be better at advancing the stent across the lesion.  I deployed the stent twice to 12 atmospheres corresponding to an estimated transluminal diameter of the stent of 3.3.  When I measured it with IVUS it turned out to be 3.1.  The stent was well deployed by IVUS.  The result was excellent.  There was no residual stenosis by angiography and the [place] class flow was 3 distally.  The intervention was considered a success.  Angiographically, the patient had this 65% stenosis in the mid-LAD as her only lesion.  The circumflex was dominant and no lesions were seen in the circumflex, obtuse marginal branches, or right coronary artery.  LV function was normal.  FINAL DIAGNOSES: 1. Single vessel coronary artery disease of mild degree in the mid-LAD,    60% stenotic by both angiography and by ultrasound. 2. Successful predilatation of eccentric plaque in the mid-LAD with cutting    balloon  angioplasty. 3. Successful stenting of the mid-LAD with a 3.0 x 15 mm Nir Elite stent. 4. Postprocedure IVUS showed complete and circumferential deployment of the    stent with compressed plaque at 8 oclock. DD:  10/22/00 TD:  10/22/00 Job: [id number]

## 2011-04-28 NOTE — Discharge Summary (Signed)
[name], [name] NO.:  [id number]   MEDICAL RECORD NO.:  [id number]          PATIENT TYPE:  OBV   LOCATION:  3706                         FACILITY:  [facility]   PHYSICIAN:  Nicki Guadalajara, M.D.     DATE OF BIRTH:  [birth year]   DATE OF ADMISSION:  06/15/2006  DATE OF DISCHARGE:  06/18/2006                                 DISCHARGE SUMMARY   DISCHARGE DIAGNOSES:  1.  Chest pain, consistent with angina, no restenosis of previously placed      left anterior descending stent by catheterization this admission.  2.  Non-insulin-dependent diabetes.  3.  Treated dyslipidemia.  4.  Treated hypertension.  5.  History of anxiety and depression.   HOSPITAL COURSE:  The patient is a 70 year old female presented to the  emergency room with upper chest, back and shoulder pain and chest tightness  and shortness of breath and dyspnea on exertion.  She says her symptoms were  similar to her symptoms prior to catheterization and LAD angioplasty in  2001.  She was admitted to the emergency room via EMS.  She was seen by Dr.  Jenne Campus on admission.  She was started on IV nitrates and heparin and ruled  out for an MI.  She was set up for diagnostic catheterization which was done  June 18, 2006 by Dr. Elsie Lincoln.  This revealed a normal left main patent LAD  stent that was placed in 2001, patent diagonal, patent circumflex, patent OM-  1, OM-2 and OM-3 and normal RCA nondominant with an EF of 50%.  Dr. Elsie Lincoln  did use a vascular closure device.  We feel she can be discharged later on  the 9th.  We have added Plavix and will continue this.  She is not taking  aspirin because she has a history of swelling from aspirin in the past.   DISCHARGE MEDICATIONS:  Discharge medications are atenolol 25 mg a day,  Zocor 40 mg a day, Cardizem CD 120 daily, Effexor 75 mg a day, Imdur 30 mg a  day, metformin 500 mg twice a day which she will start on July 12 in the  a.m. and fish oil before bed.   LABORATORIES:  Sodium 135, potassium 4.1, BUN 18, creatinine 0.8, white  count 10.7, hemoglobin 13.2, hematocrit 39.0, platelets 311.  D-dimer is  less than 0.22.  Troponins are negative.  TSH is 1.26.  EKG shows sinus  rhythm without acute changes.  Chest x-ray results shows a low volume with  atelectasis but no active disease.   DISPOSITION:  The patient will be discharged late on the 9th.  She will  follow up with Dr. Tresa Endo in a couple weeks as an outpatient.      Abelino Derrick, P.A.    ______________________________  Nicki Guadalajara, M.D.    Lenard Lance  D:  06/18/2006  T:  06/18/2006  Job:  [id number]

## 2011-04-29 NOTE — Op Note (Signed)
  NAMEJOYE, [name]                  ACCOUNT NO.:  [id number]  MEDICAL RECORD NO.:  [id number]           PATIENT TYPE:  O  LOCATION:  5021                         FACILITY:  [facility]  PHYSICIAN:  Vanita Panda. Magnus Ivan, M.D.DATE OF BIRTH:  10-31-1941  DATE OF PROCEDURE:  04/25/2011 DATE OF DISCHARGE:                              OPERATIVE REPORT   PREOPERATIVE DIAGNOSIS:  Left hand cellulitis and infection with abscess, status post cat bite.  POSTOPERATIVE DIAGNOSIS:  Left hand cellulitis and infection with abscess, status post cat bite.  PROCEDURE:  Irrigation and debridement of left hand superficial and deep tissues.  SURGEON:  Vanita Panda. Magnus Ivan, MD  ANESTHESIA:  General.  BLOOD LOSS:  Minimal.  ANTIBIOTICS:  600 mg IV clindamycin after cultures were obtained.  FINDINGS:  Significant edema and left hand web space.  COMPLICATIONS:  None.  INDICATIONS:  [name] is a 70 year old female who was bitten by her cat yesterday.  Over the last 4 hours, she has developed worsening redness and pain with swelling in her hand.  She had significant severe pain. Recommended she undergo irrigation and debridement of the left hand, given nature of the infection and worsening of this.  The risks and benefits of the surgery explained to her in detail and she understood the reason behind the procedure and surgery.  PROCEDURE DESCRIPTION:  After informed consent was obtained, the appropriate left hand was marked.  She was brought to the operating room and placed supine on the operating table with the left hand on an arm table.  General anesthesia was then obtained.  Her left hand was prepped and draped with DuraPrep and sterile drapes.  A time-out was called and she was identified as the correct patient and correct left hand.  I then made an incision along the thumb crease in the palm and encountered abundant edema and the serial cultures were obtained.  I am going to give her 600 mg  of IV clindamycin.  I then made an incision only dorsum of the hand as well and found abundant edema near the thumb.  Both incisions are irrigated with copious amounts of bacitracin solution followed by normal saline solution as well.  I did not open the flexor tendon sheath of the thumb, due to the fact that she had good flexion and extension.  After significant amount of fluid irrigating to all wounds superficial and deep loosely approximated the wounds within up to 3-0 nylon suture.  Xeroform followed by well-padded sterile dressing was applied.  The patient was awakened and extubated, and taken to the recovery room in stable condition.  Postoperatively, she should be admitted for IV antibiotics after the cultures were obtained as well as elevation.     Vanita Panda. Magnus Ivan, M.D.     CYB/MEDQ  D:  04/25/2011  T:  04/26/2011  Job:  [id number]  Electronically Signed by Doneen Poisson M.D. on 04/29/2011 07:18:38 PM

## 2011-04-29 NOTE — H&P (Signed)
  [name], [name]                  ACCOUNT NO.:  [id number]  MEDICAL RECORD NO.:  [id number]           PATIENT TYPE:  O  LOCATION:  5021                         FACILITY:  [facility]  PHYSICIAN:  Vanita Panda. Magnus Ivan, M.D.DATE OF BIRTH:  1941-06-09  DATE OF ADMISSION:  04/25/2011 DATE OF DISCHARGE:                             HISTORY & PHYSICAL   CHIEF COMPLAINT:  Severe left hand pain with known cat bite.  HISTORY OF PRESENT ILLNESS:  Ms. [name] is a 70 year old female who was bitten by her cat on her left hand yesterday.  This is in the palm near the thumb.  Over the last 24 hours, she has gotten worsening cellulitis and obvious infection.  We need to proceed urgently to the operating room for irrigation and debridement.  She understands the risks and benefits of this and does wish to proceed with surgery.  PAST MEDICAL HISTORY: 1. Status post stent placement in 2000. 2. Diabetes.  MEDICATIONS:  See medical reconciliation orders.  ALLERGIES:  ASPIRIN.  SOCIAL HISTORY:  She lives with her husband who is elderly and with significant medical problems.  She does not smoke, and does not drink, and her daughter is at the bedside.  REVIEW OF SYSTEMS:  Negative for chest pain, shortness of breath, fevers, chills, nausea, and vomiting.  Positive for severe right hand pain.  PHYSICAL EXAMINATION:  VITAL SIGNS:  She is afebrile.  Stable vital signs. GENERAL:  She is alert and oriented x3, in no acute distress, but obvious discomfort. HEENT:  Normocephalic and atraumatic.  Pupils are equal, round, and reactive to light.  Extraocular muscles are intact. NECK:  Supple. LUNGS:  Clear to auscultation bilaterally. HEART:  Regular rate and rhythm. ABDOMEN:  Benign. EXTREMITIES:  Left hand shows severe swelling and redness in the thumb with redness tracking into the wrist.  It obviously worsened over the last few hours since I saw her in our office.  She has severe pain at the base of  her thumb and the palm and obvious likely abscess.  ASSESSMENT:  This is a 70 year old female status post cat bite with worsening left hand infection.  PLAN:  We are going to take urgently to the operating room for irrigation and debridement, and then admitted for starting IV antibiotics.  She understands the risks and benefits of this and does understand the necessity with proceeding with surgery.     Vanita Panda. Magnus Ivan, M.D.     CYB/MEDQ  D:  04/25/2011  T:  04/26/2011  Job:  [id number]  Electronically Signed by Doneen Poisson M.D. on 04/29/2011 07:18:34 PM

## 2011-04-30 LAB — ANAEROBIC CULTURE

## 2011-05-06 NOTE — Discharge Summary (Signed)
  NAMEAIRANNA, PARTIN                  ACCOUNT NO.:  [id number]  MEDICAL RECORD NO.:  [id number]           PATIENT TYPE:  O  LOCATION:  5021                         FACILITY:  [facility]  PHYSICIAN:  Vanita Panda. Magnus Ivan, M.D.DATE OF BIRTH:  Apr 14, 1941  DATE OF ADMISSION:  04/25/2011 DATE OF DISCHARGE:  04/26/2011                              DISCHARGE SUMMARY   ADMITTING DIAGNOSES:  Cat bite to left hand with infection, cellulitis, and abscess.  DISCHARGE DIAGNOSES:  Cat bite to left hand with infection, cellulitis, and abscess.  PROCEDURES:  Irrigation and debridement of left hand cat bite.  HOSPITAL COURSE:  Ms. Tuff was taken to the operating room on the day of admission where she underwent irrigation and debridement of a left-hand infection secondary to cat bite.  For detailed description of the operation, please refer to the dictated operative notes and the patient's medical record.  She was then admitted for 24 hours of IV antibiotics.  By the next day, her symptoms was improved greatly.  Her swelling was decreased, she was tolerating old diet and it was felt we could transition her to oral antibiotics.  DISPOSITION:  Discharged to home.  DISPOSITION:  Doxycycline 100 mg p.o. b.i.d. for the next 10 days.  DISCHARGE INSTRUCTIONS:  While she is at home, she can get her incision wet.  She will work on range of motion of her fingers.  We will see her back in the office in a week.     Vanita Panda. Magnus Ivan, M.D.     CYB/MEDQ  D:  04/29/2011  T:  04/30/2011  Job:  [id number]  Electronically Signed by Doneen Poisson M.D. on 05/06/2011 11:10:16 AM

## 2011-11-01 ENCOUNTER — Emergency Department (HOSPITAL_COMMUNITY): Payer: Medicare Other

## 2011-11-01 ENCOUNTER — Other Ambulatory Visit: Payer: Self-pay

## 2011-11-01 ENCOUNTER — Observation Stay (HOSPITAL_COMMUNITY)
Admission: EM | Admit: 2011-11-01 | Discharge: 2011-11-01 | Disposition: A | Discharge status: Home / Self Care | Payer: Medicare Other | Attending: Emergency Medicine | Admitting: Emergency Medicine

## 2011-11-01 ENCOUNTER — Encounter: Payer: Self-pay | Admitting: Emergency Medicine

## 2011-11-01 DIAGNOSIS — R4701 Aphasia: Secondary | ICD-10-CM | POA: Insufficient documentation

## 2011-11-01 DIAGNOSIS — G459 Transient cerebral ischemic attack, unspecified: Secondary | ICD-10-CM

## 2011-11-01 DIAGNOSIS — Z9889 Other specified postprocedural states: Secondary | ICD-10-CM | POA: Insufficient documentation

## 2011-11-01 DIAGNOSIS — E119 Type 2 diabetes mellitus without complications: Secondary | ICD-10-CM | POA: Insufficient documentation

## 2011-11-01 DIAGNOSIS — R079 Chest pain, unspecified: Secondary | ICD-10-CM | POA: Insufficient documentation

## 2011-11-01 DIAGNOSIS — I2510 Atherosclerotic heart disease of native coronary artery without angina pectoris: Secondary | ICD-10-CM | POA: Insufficient documentation

## 2011-11-01 DIAGNOSIS — R4789 Other speech disturbances: Secondary | ICD-10-CM | POA: Insufficient documentation

## 2011-11-01 DIAGNOSIS — R259 Unspecified abnormal involuntary movements: Secondary | ICD-10-CM | POA: Insufficient documentation

## 2011-11-01 DIAGNOSIS — R0602 Shortness of breath: Secondary | ICD-10-CM | POA: Insufficient documentation

## 2011-11-01 DIAGNOSIS — Z79899 Other long term (current) drug therapy: Secondary | ICD-10-CM | POA: Insufficient documentation

## 2011-11-01 HISTORY — DX: Atherosclerotic heart disease of native coronary artery without angina pectoris: I25.10

## 2011-11-01 HISTORY — DX: Essential (primary) hypertension: I10

## 2011-11-01 HISTORY — DX: Hyperlipidemia, unspecified: E78.5

## 2011-11-01 LAB — DIFFERENTIAL
Basophils Absolute: 0 10*3/uL (ref 0.0–0.1)
Basophils Relative: 0 % (ref 0–1)
Eosinophils Absolute: 0.1 10*3/uL (ref 0.0–0.7)
Eosinophils Relative: 1 % (ref 0–5)
Monocytes Absolute: 0.9 10*3/uL (ref 0.1–1.0)
Monocytes Relative: 10 % (ref 3–12)
Neutro Abs: 6 10*3/uL (ref 1.7–7.7)

## 2011-11-01 LAB — CBC
HCT: 38.3 % (ref 36.0–46.0)
MCH: 29.4 pg (ref 26.0–34.0)
MCHC: 33.7 g/dL (ref 30.0–36.0)
RDW: 13.2 % (ref 11.5–15.5)

## 2011-11-01 LAB — LIPID PANEL
Cholesterol: 208 mg/dL — ABNORMAL HIGH (ref 0–200)
HDL: 54 mg/dL
LDL Cholesterol: 122 mg/dL — ABNORMAL HIGH (ref 0–99)
Total CHOL/HDL Ratio: 3.9 ratio
Triglycerides: 158 mg/dL — ABNORMAL HIGH
VLDL: 32 mg/dL (ref 0–40)

## 2011-11-01 LAB — POCT I-STAT TROPONIN I: Troponin i, poc: 0 ng/mL (ref 0.00–0.08)

## 2011-11-01 LAB — BASIC METABOLIC PANEL
BUN: 16 mg/dL (ref 6–23)
Calcium: 9.7 mg/dL (ref 8.4–10.5)
Chloride: 102 mEq/L (ref 96–112)
Creatinine, Ser: 0.89 mg/dL (ref 0.50–1.10)
GFR calc Af Amer: 73 mL/min — ABNORMAL LOW (ref >90)
GFR calc non Af Amer: 63 mL/min — ABNORMAL LOW (ref >90)

## 2011-11-01 LAB — GLUCOSE, CAPILLARY: Glucose-Capillary: 129 mg/dL — ABNORMAL HIGH (ref 70–99)

## 2011-11-01 LAB — URINALYSIS, ROUTINE W REFLEX MICROSCOPIC
Bilirubin Urine: NEGATIVE
Glucose, UA: NEGATIVE mg/dL
Hgb urine dipstick: NEGATIVE
Ketones, ur: NEGATIVE mg/dL
Nitrite: NEGATIVE
Protein, ur: NEGATIVE mg/dL
Specific Gravity, Urine: 1.01 (ref 1.005–1.030)
Urobilinogen, UA: 0.2 mg/dL (ref 0.0–1.0)
pH: 5.5 (ref 5.0–8.0)

## 2011-11-01 LAB — URINE MICROSCOPIC-ADD ON

## 2011-11-01 LAB — TROPONIN I: Troponin I: <0.3 ng/mL (ref <0.30)

## 2011-11-01 LAB — CARDIAC PANEL(CRET KIN+CKTOT+MB+TROPI)
CK, MB: 3.3 ng/mL (ref 0.3–4.0)
Relative Index: INVALID (ref 0.0–2.5)
Total CK: 51 U/L (ref 7–177)
Troponin I: <0.3 ng/mL

## 2011-11-01 LAB — HEMOGLOBIN A1C
Hgb A1c MFr Bld: 6.8 % — ABNORMAL HIGH
Mean Plasma Glucose: 148 mg/dL — ABNORMAL HIGH

## 2011-11-01 LAB — PROTIME-INR: Prothrombin Time: 13.5 seconds (ref 11.6–15.2)

## 2011-11-01 MED ORDER — ENOXAPARIN SODIUM 30 MG/0.3ML ~~LOC~~ SOLN: 30.0000 mg | Freq: Two times a day (BID) | SUBCUTANEOUS | Status: DC

## 2011-11-01 MED ORDER — LORAZEPAM 2 MG/ML IJ SOLN
INTRAMUSCULAR | Status: AC
  Administered 2011-11-01: 1 mg via INTRAVENOUS
  Filled 2011-11-01: qty 1

## 2011-11-01 MED ORDER — SODIUM CHLORIDE 0.9 % IV SOLN
INTRAVENOUS | Status: DC
  Administered 2011-11-01: 05:00:00 via INTRAVENOUS

## 2011-11-01 MED ORDER — STROKE: EARLY STAGES OF RECOVERY BOOK
Freq: Once | Status: AC
  Administered 2011-11-01: 10:00:00
  Filled 2011-11-01: qty 1

## 2011-11-01 MED ORDER — LORAZEPAM 2 MG/ML IJ SOLN
1.0000 mg | Freq: Once | INTRAMUSCULAR | Status: AC
  Administered 2011-11-01: 1 mg via INTRAVENOUS

## 2011-11-01 MED ORDER — DIPHENHYDRAMINE HCL 25 MG PO CAPS
25.0000 mg | ORAL_CAPSULE | Freq: Once | ORAL | Status: AC
  Administered 2011-11-01: 25 mg via ORAL
  Filled 2011-11-01: qty 1

## 2011-11-01 MED ORDER — ACETAMINOPHEN 325 MG PO TABS: 650.0000 mg | ORAL_TABLET | ORAL | Status: DC | PRN

## 2011-11-01 NOTE — ED Notes (Signed)
Meal ordered for pt 

## 2011-11-01 NOTE — Progress Notes (Signed)
  Echocardiogram 2D Echocardiogram has been performed.  Mercy Moore 11/01/2011, 9:14 AM

## 2011-11-01 NOTE — ED Provider Notes (Signed)
Pt signed out to me at the change of shift. Pt ws on TIA protocol, had an episode of chest pain. Pt is currently symptom free. Work up for TIA negative. Pt awaiting cardiology consult. Call has been made.  Cardiology seen and cleared pt to go home with appointment given to her for follow up. Pt is discharged.  Lottie Mussel, [place] 11/01/11 [id number]

## 2011-11-01 NOTE — ED Notes (Signed)
Pt returned from MRI °

## 2011-11-01 NOTE — ED Notes (Signed)
Pt and family updated on plan of care. Currently we are awaiting cardiology to call back

## 2011-11-01 NOTE — ED Notes (Signed)
Pt to ED for eval of burning in chest; pt reports that in the past week, she has been having tremors badly on L side; Dr Clelia Croft prescribed meds to help settle tremors, pt reports that also she has been "out of it" for a week, has not been able to think straight; pt reports yeseterday morning she started slurring words, and was unable to make words that she was trying to say; pt reports that when she went to sleep, she would wake up short of breath; pt denies n/v/diaphoresis, pt describes pain as burning in chest, midsternal

## 2011-11-01 NOTE — ED Provider Notes (Signed)
The patient in the CDU on TIA protocol after a period of expressive aphasia yesterday morning lasting several hours to resolve spontaneously and has not recurred since that time. Her MRI of the brain is negative for any acute findings. Her carotid Doppler study and 2-D echocardiogram are unremarkable for any findings that could preclude a TIA.  I also reviewed the note written by the cardiology nurse practitioner and will re-consult them to discuss study findings and ensure appropriate followup. The patient will be started on a statin for LDL >100. She will also be started on 325mg  ASA daily. Regarding her diabetes and Hgb A1C of 6.8, I have recommended primary physician follow-up.    3:07 PM- I have consulted [facility] and the cardiologist will see the patient in the emergency department.   Elwyn Reach [place], [place] 11/01/11 1509  As an addendum to the prior note- the patient is allergic to aspirin and has a reaction of swelling. She'll not be started on aspirin. In addition the patient reports to me that she has just started taking Zetia, 10mg . she was initially on this dyslipidemia medication but stopped taking it because her medications were too expensive. She has picked up her refill as of yesterday and has assured me she will take it as directed.  Elwyn Reach [place], [place] 11/01/11 1521

## 2011-11-01 NOTE — ED Notes (Signed)
Initiated pt education via brain attack booklet and pt's risk factors; notified pt that she will received more education as more tests are done

## 2011-11-01 NOTE — ED Notes (Signed)
pts daughter going home Albertina [phone]

## 2011-11-01 NOTE — ED Notes (Signed)
PT. REPORTS SLURRED SPEECH YESTERDAY MORNING ,  ALERT AND ORIENTED , SPEECH CLEAR ,  NO FACIAL SYMMETRY ,  EQUAL STRONG GRIPS , AMBULATORY,  ALSO REPORTS CHEST "BURNING" ONSET LAST WEEK ,  NO SOB , NO NAUSEA , NO CHEST PAIN . NO MEDS PRIOR TO ARRIVAL.

## 2011-11-01 NOTE — ED Notes (Signed)
Cards at bedside

## 2011-11-01 NOTE — ED Notes (Signed)
Pt transported for Echo. Pt alert and oriented. NAD Vitals stable. Pt ambulated from bed to wheelchair without any difficulty.

## 2011-11-01 NOTE — ED Provider Notes (Addendum)
History     CSN: [id number] Arrival date & time: 11/01/2011 12:44 AM   First MD Initiated Contact with Patient 11/01/11 0101      Chief Complaint  Patient presents with  . Aphasia    (Consider location/radiation/quality/duration/timing/severity/associated sxs/prior treatment) HPI Comments: She presents with her daughter with a complaint of shortness of breath, arm tremor bilaterally and slurred speech with expressive aphasia that occurred yesterday morning and is completely resolved. Patient states that over the last 3-4 weeks she has had bilateral arm tremor for which her family doctor has prescribed primidone. She states that this helps intermittently. Yesterday morning she developed an expressive aphasia stating that she could think in her mind what she wanted to say but the words would not come out right. Her daughter and endorses that she did have slurred speech. This resolved over the course of 3 or 4 hours and has not recurred. She denies focal weakness, numbness, focal visual changes, ataxia or headaches.   She does take Xanax for anxiety and notes that she has some improvement in her shortness of breath and chest pain with the Xanax. Her chest pain is a burning sensation that goes across her chest and occurs mostly at night. Her shortness of breath also occurs at night and wakes her frequently from sleep. She has been diagnosed with coronary disease and has a stent in one of her coronary arteries. She is treated by [facility] heart and vascular by Dr. Tresa Endo. She denies chest pain or shortness of breath at this time  The history is provided by the patient and a relative.    Past Medical History  Diagnosis Date  . Coronary artery disease   . Diabetes mellitus     Past Surgical History  Procedure Date  . Cholecystectomy   . Appendectomy   . Hysterectomy   . Coronary stent placement     No family history on file.  History  Substance Use Topics  . Smoking status: Never  Smoker   . Smokeless tobacco: Not on file  . Alcohol Use: No    OB History    Grav Para Term Preterm Abortions TAB SAB Ect Mult Living                  Review of Systems  All other systems reviewed and are negative.    Allergies  Aspirin  Home Medications   Current Outpatient Rx  Name Route Sig Dispense Refill  . METFORMIN HCL 1000 MG PO TABS Oral Take 1,000 mg by mouth 2 (two) times daily with a meal.      . PANTOPRAZOLE SODIUM 20 MG PO TBEC Oral Take 20 mg by mouth daily.      Marland Kitchen PRIMIDONE 50 MG PO TABS Oral Take 50 mg by mouth at bedtime.      . VENLAFAXINE HCL 75 MG PO CP24 Oral Take 75 mg by mouth daily.        BP 159/63  Pulse 88  Temp(Src) 98.1 F (36.7 C) (Oral)  SpO2 100%  Physical Exam  Nursing note and vitals reviewed. Constitutional: She appears well-developed and well-nourished. No distress.  HENT:  Head: Normocephalic and atraumatic.  Mouth/Throat: Oropharynx is clear and moist. No oropharyngeal exudate.  Eyes: Conjunctivae and EOM are normal. Pupils are equal, round, and reactive to light. Right eye exhibits no discharge. Left eye exhibits no discharge. No scleral icterus.  Neck: Normal range of motion. Neck supple. No JVD present. No thyromegaly present.  Cardiovascular: Normal  rate, regular rhythm, normal heart sounds and intact distal pulses.  Exam reveals no gallop and no friction rub.   No murmur heard. Pulmonary/Chest: Effort normal and breath sounds normal. No respiratory distress. She has no wheezes. She has no rales.  Abdominal: Soft. Bowel sounds are normal. She exhibits no distension and no mass. There is no tenderness.  Musculoskeletal: Normal range of motion. She exhibits no edema and no tenderness.  Lymphadenopathy:    She has no cervical adenopathy.  Neurological: She is alert. Coordination normal.       Speech clear, cranial nerves III through XII intact, no peripheral tremor, no focal weakness or numbness of the extremities. Patient is  alert and oriented x3  Skin: Skin is warm and dry. No rash noted. No erythema.  Psychiatric: She has a normal mood and affect. Her behavior is normal.    ED Course  Procedures (including critical care time)   Labs Reviewed  I-STAT TROPONIN I  CBC  DIFFERENTIAL  BASIC METABOLIC PANEL  PROTIME-INR  APTT  CK TOTAL AND CKMB  TROPONIN I   No results found.   No diagnosis found.    MDM  Cardiac exam, pulmonary exam normal, neurologic exam without focal deficits, EKG without ischemia.  History consistent with transient ischemic attack which has resolved. We'll sent for CT scan, labs, EKG.  ED ECG REPORT   Date: 11/01/2011   Rate: 88  Rhythm: normal sinus rhythm  QRS Axis: normal  Intervals: normal  ST/T Wave abnormalities: normal  Conduction Disutrbances:none  Narrative Interpretation:   Old EKG Reviewed: unchanged and Compared with EKG from 08/10/2010   With a blood pressure of 132/42, isolated aphasia, symptoms cleared after several hours she is ABCD2 #5.  She qualifies for CDU protocol TIA. I have discussed her care with the nurse practitioner for [facility] heart and vascular, who will evaluate from a chest pain standpoint though review of the medical records suggest that she had a normal cardiac catheterization in August of last year. This includes a patent stent to the left anterior descending.     Vida Roller, MD 11/01/11 0410  Change of shift - care signed out to Dr. Manus Gunning and [facility]  Vida Roller, MD 11/01/11 [phone]

## 2011-11-01 NOTE — ED Notes (Signed)
Pt has ambulated to the restroom to void

## 2011-11-01 NOTE — ED Notes (Signed)
Pt is back from MRI and placed on monitor in NSR

## 2011-11-01 NOTE — ED Notes (Signed)
Pt returned from echo

## 2011-11-01 NOTE — ED Notes (Signed)
CBG 129 

## 2011-11-01 NOTE — ED Notes (Signed)
Received phone call from MRI, that pt is anxious and requesting something to calm her nerves and claustrophobia; notified EDP and meds ordered

## 2011-11-01 NOTE — Progress Notes (Signed)
*  PRELIMINARY RESULTS*  Carotid Dopplers has been performed.  No significant ICA stenosis bilaterally. Vertebral arteries are patent with antegrade flow.  Mila Homer 11/01/2011, 9:44 AM

## 2011-11-01 NOTE — Consult Note (Signed)
Reason for Consult:CP SOB at night Referring Physician: Eber Hong, MD  [name] is an 70 y.o. female.  HPI: [name] is a pleasant 70 year old white female with history of CAD s/p stent to LAD in 2001 with DM type II, dyslipidemia and htn who presented to the ED with c/o aphasia which occurred yesterday morning and lasted for several hours before slowly going away. She has also experienced episodes of chest discomfort which usually occur at night when she lays down. She describes this as a burning sensation across her chest. Xanax does help with this discomfort. She also states that at times she awakens with SOB and the Xanax helps with this as well. Currently she is CP free and denies SOB. EKG reveals NSR without ischemic changes and initial troponin is negative.    Past Medical History  Diagnosis Date  . Coronary artery disease     Mid LAD Stent 2001 Patent By cath on 08/10/2010  . Diabetes mellitus     Type II  . Dyslipidemia   . HTN (hypertension)     Past Surgical History  Procedure Date  . Cholecystectomy   . Appendectomy   . Hysterectomy   . Coronary stent placement     No family history on file.  Social History:  reports that she has never smoked. She does not have any smokeless tobacco history on file. She reports that she does not drink alcohol or use illicit drugs.  Allergies:  Allergies  Allergen Reactions  . Aspirin     swelling    Medications: I have reviewed the patient's current medications.  Results for orders placed during the hospital encounter of 11/01/11 (from the past 48 hour(s))  CBC     Status: Normal   Collection Time   11/01/11 12:55 AM      Component Value Range Comment   WBC 9.2  4.0 - 10.5 (K/uL)    RBC 4.39  3.87 - 5.11 (MIL/uL)    Hemoglobin 12.9  12.0 - 15.0 (g/dL)    HCT 16.1  09.6 - 04.5 (%)    MCV 87.2  78.0 - 100.0 (fL)    MCH 29.4  26.0 - 34.0 (pg)    MCHC 33.7  30.0 - 36.0 (g/dL)    RDW 40.9  81.1 - 91.4 (%)    Platelets 312   150 - 400 (K/uL)   DIFFERENTIAL     Status: Normal   Collection Time   11/01/11 12:55 AM      Component Value Range Comment   Neutrophils Relative 66  43 - 77 (%)    Neutro Abs 6.0  1.7 - 7.7 (K/uL)    Lymphocytes Relative 23  12 - 46 (%)    Lymphs Abs 2.1  0.7 - 4.0 (K/uL)    Monocytes Relative 10  3 - 12 (%)    Monocytes Absolute 0.9  0.1 - 1.0 (K/uL)    Eosinophils Relative 1  0 - 5 (%)    Eosinophils Absolute 0.1  0.0 - 0.7 (K/uL)    Basophils Relative 0  0 - 1 (%)    Basophils Absolute 0.0  0.0 - 0.1 (K/uL)   BASIC METABOLIC PANEL     Status: Abnormal   Collection Time   11/01/11 12:55 AM      Component Value Range Comment   Sodium 138  135 - 145 (mEq/L)    Potassium 4.1  3.5 - 5.1 (mEq/L)    Chloride 102  96 - 112 (mEq/L)    CO2 25  19 - 32 (mEq/L)    Glucose, Bld 218 (*) 70 - 99 (mg/dL)    BUN 16  6 - 23 (mg/dL)    Creatinine, Ser 1.61  0.50 - 1.10 (mg/dL)    Calcium 9.7  8.4 - 10.5 (mg/dL)    GFR calc non Af Amer 63 (*) >90 (mL/min)    GFR calc Af Amer 73 (*) >90 (mL/min)   PROTIME-INR     Status: Normal   Collection Time   11/01/11 12:55 AM      Component Value Range Comment   Prothrombin Time 13.5  11.6 - 15.2 (seconds)    INR 1.01  0.00 - 1.49    APTT     Status: Normal   Collection Time   11/01/11 12:55 AM      Component Value Range Comment   aPTT 30  24 - 37 (seconds)   CK TOTAL AND CKMB     Status: Normal   Collection Time   11/01/11 12:55 AM      Component Value Range Comment   Total CK 63  7 - 177 (U/L)    CK, MB 3.4  0.3 - 4.0 (ng/mL)    Relative Index RELATIVE INDEX IS INVALID  0.0 - 2.5    TROPONIN I     Status: Normal   Collection Time   11/01/11 12:55 AM      Component Value Range Comment   Troponin I <0.30  <0.30 (ng/mL)   POCT I-STAT TROPONIN I     Status: Normal   Collection Time   11/01/11  1:11 AM      Component Value Range Comment   Troponin i, poc 0.00  0.00 - 0.08 (ng/mL)    Comment 3            URINALYSIS, ROUTINE W REFLEX  MICROSCOPIC     Status: Abnormal   Collection Time   11/01/11  3:58 AM      Component Value Range Comment   Color, Urine YELLOW  YELLOW     Appearance CLEAR  CLEAR     Specific Gravity, Urine 1.010  1.005 - 1.030     pH 5.5  5.0 - 8.0     Glucose, UA NEGATIVE  NEGATIVE (mg/dL)    Hgb urine dipstick NEGATIVE  NEGATIVE     Bilirubin Urine NEGATIVE  NEGATIVE     Ketones, ur NEGATIVE  NEGATIVE (mg/dL)    Protein, ur NEGATIVE  NEGATIVE (mg/dL)    Urobilinogen, UA 0.2  0.0 - 1.0 (mg/dL)    Nitrite NEGATIVE  NEGATIVE     Leukocytes, UA MODERATE (*) NEGATIVE    URINE MICROSCOPIC-ADD ON     Status: Abnormal   Collection Time   11/01/11  3:58 AM      Component Value Range Comment   Squamous Epithelial / LPF FEW (*) RARE     WBC, UA 3-6  <3 (WBC/hpf)    RBC / HPF 0-2  <3 (RBC/hpf)    Bacteria, UA RARE  RARE    LIPID PANEL     Status: Abnormal   Collection Time   11/01/11  4:13 AM      Component Value Range Comment   Cholesterol 208 (*) 0 - 200 (mg/dL)    Triglycerides 096 (*) <150 (mg/dL)    HDL 54  >04 (mg/dL)    Total CHOL/HDL Ratio 3.9      VLDL 32  0 - 40 (mg/dL)    LDL Cholesterol 161 (*) 0 - 99 (mg/dL)   CARDIAC PANEL(CRET KIN+CKTOT+MB+TROPI)     Status: Normal   Collection Time   11/01/11  4:13 AM      Component Value Range Comment   Total CK 51  7 - 177 (U/L)    CK, MB 3.3  0.3 - 4.0 (ng/mL)    Troponin I <0.30  <0.30 (ng/mL)    Relative Index RELATIVE INDEX IS INVALID  0.0 - 2.5      Dg Chest 2 View  11/01/2011  *RADIOLOGY REPORT*  Clinical Data: Chest pain.  CHEST - 2 VIEW 11/01/2011:  Comparison: Two-view chest x-ray 04/25/2011, CTA chest 08/25/2010, and portable chest x-rays 08/10/2010 and 08/09/2010 [facility].  Findings: Cardiac silhouette normal in size.  Thoracic aorta mildly atherosclerotic, unchanged.  Hilar and mediastinal contours otherwise.  Lungs clear.  Bronchovascular markings normal.  No pleural effusions.  Degenerative changes involving the  thoracic spine.  No significant interval change.  IMPRESSION: No acute cardiopulmonary disease.  Stable examination.  Original Report Authenticated By: Arnell Sieving, M.D.   Ct Head Wo Contrast  11/01/2011  *RADIOLOGY REPORT*  Clinical Data: Expressive a fascia.  CT HEAD WITHOUT CONTRAST  Technique:  Contiguous axial images were obtained from the base of the skull through the vertex without contrast.  Comparison: None.  Findings: Periventricular and subcortical white matter hypodensities are most in keeping with chronic microangiopathic change. There is no evidence for acute hemorrhage, hydrocephalus, mass lesion, or abnormal extra-axial fluid collection.  No definite CT evidence for acute infarction.  The visualized paranasal sinuses and mastoid air cells are predominately clear.  IMPRESSION: Mild white matter hypodensities, a nonspecific finding often secondary to chronic microangiopathic change.  No acute intracranial abnormality identified by CT.  Original Report Authenticated By: Waneta Martins, M.D.    Review of Systems  Constitutional: Positive for malaise/fatigue. Negative for fever and chills.  HENT: Negative for hearing loss, ear pain, nosebleeds, congestion, tinnitus and ear discharge.   Eyes: Negative for blurred vision and double vision.  Respiratory: Positive for shortness of breath. Negative for cough, hemoptysis and wheezing.   Cardiovascular: Positive for chest pain and palpitations.  Gastrointestinal: Negative for heartburn, nausea, vomiting, abdominal pain and diarrhea.  Genitourinary: Negative for dysuria, hematuria and flank pain.  Musculoskeletal: Positive for myalgias.  Skin: Negative for itching and rash.  Neurological: Positive for tremors and headaches.  Psychiatric/Behavioral: Positive for memory loss. Negative for depression, suicidal ideas, hallucinations and substance abuse. The patient is nervous/anxious.    Blood pressure 126/47, pulse 73, temperature 97.8  F (36.6 C), temperature source Oral, resp. rate 17, height 5\' 6"  (1.676 m), weight 88.905 kg (196 lb), SpO2 98.00%. Physical Exam  Constitutional: She is oriented to person, place, and time. She appears well-developed and well-nourished.  HENT:  Head: Normocephalic and atraumatic.  Eyes: Conjunctivae and EOM are normal. Pupils are equal, round, and reactive to light.  Neck: Normal range of motion. Neck supple.  Cardiovascular: Normal rate, regular rhythm and normal heart sounds.  Exam reveals no friction rub.   No murmur heard. Respiratory: Effort normal and breath sounds normal. No respiratory distress. She has no wheezes. She has no rales. She exhibits no tenderness.  GI: Soft. Bowel sounds are normal. She exhibits no distension and no mass. There is no tenderness. There is no rebound and no guarding.  Musculoskeletal: Normal range of motion. She exhibits no edema and no tenderness.  Neurological: She is alert and oriented to person, place, and time. No cranial nerve deficit.  Skin: Skin is warm and dry. No rash noted.  Psychiatric: She has a normal mood and affect.    Assessment/Plan: 1)? TIA with expressive aphasia, now resolved 2)CP 3)DM II 4)HTN 5)Dyslipidemia 6)Known CAD with patent LAD stent by cath 07/2010 7)Anxiety  Continue to cycle cardiac enzymes, review echo for wm abn. Follow up MRI, TIA workup. Further recs pending outcomes this am.  STONE,KATHERINE L 11/01/2011, 6:17 AM   Examined by me. Agree with note. Interim developments include: absence of chest "burning" recurrence, normal cardiac enzymes repeatedly, normal echo (except mild left atrial dilation), unremarkable CT and MRI. Taking low risk coronary angio last August 2011, likelihood of serious adverse CV event is low. Further w/u as outpt. (vasodilator myocardial perfusion scan scheduled for 11/09/11 and f/u with Dr. Tresa Endo on December 3). Aailyah Dunbar  4:48 PM

## 2011-11-01 NOTE — ED Provider Notes (Signed)
Medical screening examination/treatment/procedure(s) were performed by non-physician practitioner and as supervising physician I was immediately available for consultation/collaboration.  Zael Shuman K Sandrea Boer-Rasch, MD 11/01/11 2339 

## 2011-11-02 NOTE — ED Provider Notes (Signed)
Medical screening examination/treatment/procedure(s) were performed by non-physician practitioner and as supervising physician I was immediately available for consultation/collaboration.   Vida Roller, MD 11/02/11 [phone]

## 2012-01-10 DIAGNOSIS — R0609 Other forms of dyspnea: Secondary | ICD-10-CM | POA: Diagnosis not present

## 2012-01-10 DIAGNOSIS — G20 Parkinson's disease: Secondary | ICD-10-CM | POA: Diagnosis not present

## 2012-01-10 DIAGNOSIS — R0989 Other specified symptoms and signs involving the circulatory and respiratory systems: Secondary | ICD-10-CM | POA: Diagnosis not present

## 2012-01-19 DIAGNOSIS — G4733 Obstructive sleep apnea (adult) (pediatric): Secondary | ICD-10-CM | POA: Diagnosis not present

## 2012-01-26 DIAGNOSIS — I10 Essential (primary) hypertension: Secondary | ICD-10-CM | POA: Diagnosis not present

## 2012-01-26 DIAGNOSIS — E785 Hyperlipidemia, unspecified: Secondary | ICD-10-CM | POA: Diagnosis not present

## 2012-01-26 DIAGNOSIS — E1149 Type 2 diabetes mellitus with other diabetic neurological complication: Secondary | ICD-10-CM | POA: Diagnosis not present

## 2012-01-26 DIAGNOSIS — G20 Parkinson's disease: Secondary | ICD-10-CM | POA: Diagnosis not present

## 2012-02-09 DIAGNOSIS — G4733 Obstructive sleep apnea (adult) (pediatric): Secondary | ICD-10-CM | POA: Diagnosis not present

## 2012-02-11 DIAGNOSIS — IMO0002 Reserved for concepts with insufficient information to code with codable children: Secondary | ICD-10-CM | POA: Diagnosis not present

## 2012-02-23 DIAGNOSIS — E1139 Type 2 diabetes mellitus with other diabetic ophthalmic complication: Secondary | ICD-10-CM | POA: Diagnosis not present

## 2012-04-05 DIAGNOSIS — G20 Parkinson's disease: Secondary | ICD-10-CM | POA: Diagnosis not present

## 2012-04-05 DIAGNOSIS — G4714 Hypersomnia due to medical condition: Secondary | ICD-10-CM | POA: Diagnosis not present

## 2012-04-05 DIAGNOSIS — G4733 Obstructive sleep apnea (adult) (pediatric): Secondary | ICD-10-CM | POA: Diagnosis not present

## 2012-05-02 DIAGNOSIS — E785 Hyperlipidemia, unspecified: Secondary | ICD-10-CM | POA: Diagnosis not present

## 2012-05-02 DIAGNOSIS — I10 Essential (primary) hypertension: Secondary | ICD-10-CM | POA: Diagnosis not present

## 2012-05-02 DIAGNOSIS — E1149 Type 2 diabetes mellitus with other diabetic neurological complication: Secondary | ICD-10-CM | POA: Diagnosis not present

## 2012-05-09 DIAGNOSIS — E559 Vitamin D deficiency, unspecified: Secondary | ICD-10-CM | POA: Diagnosis not present

## 2012-05-09 DIAGNOSIS — E1149 Type 2 diabetes mellitus with other diabetic neurological complication: Secondary | ICD-10-CM | POA: Diagnosis not present

## 2012-05-09 DIAGNOSIS — Z0000 Encounter for general adult medical examination without abnormal findings: Secondary | ICD-10-CM | POA: Diagnosis not present

## 2012-05-09 DIAGNOSIS — I10 Essential (primary) hypertension: Secondary | ICD-10-CM | POA: Diagnosis not present

## 2012-05-09 DIAGNOSIS — R82998 Other abnormal findings in urine: Secondary | ICD-10-CM | POA: Diagnosis not present

## 2012-05-09 DIAGNOSIS — E785 Hyperlipidemia, unspecified: Secondary | ICD-10-CM | POA: Diagnosis not present

## 2012-05-13 DIAGNOSIS — G20 Parkinson's disease: Secondary | ICD-10-CM | POA: Diagnosis not present

## 2012-05-13 DIAGNOSIS — R413 Other amnesia: Secondary | ICD-10-CM | POA: Diagnosis not present

## 2012-05-13 DIAGNOSIS — Z1212 Encounter for screening for malignant neoplasm of rectum: Secondary | ICD-10-CM | POA: Diagnosis not present

## 2012-06-24 ENCOUNTER — Ambulatory Visit
Admission: RE | Admit: 2012-06-24 | Discharge: 2012-06-24 | Disposition: A | Discharge status: Home / Self Care | Payer: Medicare Other | Source: Ambulatory Visit | Attending: Diagnostic Neuroimaging | Admitting: Diagnostic Neuroimaging

## 2012-06-24 ENCOUNTER — Other Ambulatory Visit: Payer: Self-pay | Admitting: Diagnostic Neuroimaging

## 2012-06-24 DIAGNOSIS — R609 Edema, unspecified: Secondary | ICD-10-CM | POA: Diagnosis not present

## 2012-06-24 DIAGNOSIS — G20 Parkinson's disease: Secondary | ICD-10-CM | POA: Diagnosis not present

## 2012-06-24 DIAGNOSIS — M79609 Pain in unspecified limb: Secondary | ICD-10-CM | POA: Diagnosis not present

## 2012-07-24 DIAGNOSIS — M25569 Pain in unspecified knee: Secondary | ICD-10-CM | POA: Diagnosis not present

## 2012-08-01 DIAGNOSIS — M25569 Pain in unspecified knee: Secondary | ICD-10-CM | POA: Diagnosis not present

## 2012-08-05 DIAGNOSIS — E119 Type 2 diabetes mellitus without complications: Secondary | ICD-10-CM | POA: Diagnosis not present

## 2012-08-05 DIAGNOSIS — I119 Hypertensive heart disease without heart failure: Secondary | ICD-10-CM | POA: Diagnosis not present

## 2012-08-05 DIAGNOSIS — I2510 Atherosclerotic heart disease of native coronary artery without angina pectoris: Secondary | ICD-10-CM | POA: Diagnosis not present

## 2012-08-05 DIAGNOSIS — E782 Mixed hyperlipidemia: Secondary | ICD-10-CM | POA: Diagnosis not present

## 2012-09-16 DIAGNOSIS — I259 Chronic ischemic heart disease, unspecified: Secondary | ICD-10-CM | POA: Diagnosis not present

## 2012-09-16 DIAGNOSIS — E785 Hyperlipidemia, unspecified: Secondary | ICD-10-CM | POA: Diagnosis not present

## 2012-09-16 DIAGNOSIS — F0280 Dementia in other diseases classified elsewhere without behavioral disturbance: Secondary | ICD-10-CM | POA: Diagnosis not present

## 2012-09-16 DIAGNOSIS — E1149 Type 2 diabetes mellitus with other diabetic neurological complication: Secondary | ICD-10-CM | POA: Diagnosis not present

## 2012-09-16 DIAGNOSIS — Z23 Encounter for immunization: Secondary | ICD-10-CM | POA: Diagnosis not present

## 2012-09-16 DIAGNOSIS — I10 Essential (primary) hypertension: Secondary | ICD-10-CM | POA: Diagnosis not present

## 2012-09-16 DIAGNOSIS — G3183 Dementia with Lewy bodies: Secondary | ICD-10-CM | POA: Diagnosis not present

## 2012-10-07 DIAGNOSIS — R82998 Other abnormal findings in urine: Secondary | ICD-10-CM | POA: Diagnosis not present

## 2012-10-07 DIAGNOSIS — I10 Essential (primary) hypertension: Secondary | ICD-10-CM | POA: Diagnosis not present

## 2012-10-07 DIAGNOSIS — N390 Urinary tract infection, site not specified: Secondary | ICD-10-CM | POA: Diagnosis not present

## 2012-10-07 DIAGNOSIS — R05 Cough: Secondary | ICD-10-CM | POA: Diagnosis not present

## 2013-01-17 DIAGNOSIS — I259 Chronic ischemic heart disease, unspecified: Secondary | ICD-10-CM | POA: Diagnosis not present

## 2013-01-17 DIAGNOSIS — I10 Essential (primary) hypertension: Secondary | ICD-10-CM | POA: Diagnosis not present

## 2013-01-17 DIAGNOSIS — E785 Hyperlipidemia, unspecified: Secondary | ICD-10-CM | POA: Diagnosis not present

## 2013-01-17 DIAGNOSIS — E1149 Type 2 diabetes mellitus with other diabetic neurological complication: Secondary | ICD-10-CM | POA: Diagnosis not present

## 2013-03-25 ENCOUNTER — Encounter: Payer: Self-pay | Admitting: Diagnostic Neuroimaging

## 2013-03-25 ENCOUNTER — Ambulatory Visit (INDEPENDENT_AMBULATORY_CARE_PROVIDER_SITE_OTHER): Payer: BLUE CROSS/BLUE SHIELD | Admitting: Diagnostic Neuroimaging

## 2013-03-25 VITALS — BP 110/63 | HR 72 | Temp 98.0°F | Ht 66.0 in | Wt 198.0 lb

## 2013-03-25 DIAGNOSIS — I2510 Atherosclerotic heart disease of native coronary artery without angina pectoris: Secondary | ICD-10-CM | POA: Insufficient documentation

## 2013-03-25 DIAGNOSIS — F411 Generalized anxiety disorder: Secondary | ICD-10-CM | POA: Diagnosis not present

## 2013-03-25 DIAGNOSIS — E119 Type 2 diabetes mellitus without complications: Secondary | ICD-10-CM | POA: Insufficient documentation

## 2013-03-25 DIAGNOSIS — G20 Parkinson's disease: Secondary | ICD-10-CM | POA: Diagnosis not present

## 2013-03-25 DIAGNOSIS — G4733 Obstructive sleep apnea (adult) (pediatric): Secondary | ICD-10-CM | POA: Insufficient documentation

## 2013-03-25 MED ORDER — CARBIDOPA-LEVODOPA 25-100 MG PO TABS: 1.0000 | ORAL_TABLET | Freq: Every day | ORAL | Status: DC

## 2013-03-25 NOTE — Patient Instructions (Addendum)
Increase carb/levo up to 6 tabs per day (1.5, 1.5, 1, 1, 1)  Take stool softener (colace)  I will refer to psychiatry and physical therapy.  Use a walker.

## 2013-03-25 NOTE — Progress Notes (Signed)
[facility]  PATIENT: [name] DOB: 1941/07/20  REFERRING CLINICIAN:  HISTORY FROM: patient, daughter, husband REASON FOR VISIT: new consult   HISTORICAL  CHIEF COMPLAINT:  Chief Complaint  Patient presents with  . Tremors    follow up    HISTORY OF PRESENT ILLNESS:   Update 03/25/13: since last visit patient's tremor, balance and gait have worsened. Her memory is slightly worse. She's also becoming very obsessive compulsive. She has difficulty with going to the bathroom and may sit on the toilet for 1 or 2 hours at a time. She's become obsessed with the timing and scheduling going to the bathroom.  UPDATE 06/24/12: Now complaining of right leg pain (behind knee) severe pain since Sunday. Also intermittent since past 4-6 weeks. Started before starting exelon patch. Some cramps in her thighs. No back pain. Increased carb/levo is helping. Not tried clonazepam yet. Exelon patch seems to help her cognitive symptoms.   UPDATE 05/13/12: Cognitive problems, navigation abilities, perseveration and short term memory loss now more problematic.Seems to be progressing at rapid pace per family. Poor sleep at night. More anxiety at night. Carb/levo working, but tends to wear off before next dose. Now takes 4 tabs per day (with meals + 1 qhs). Yesterday had visual hallucination (saw a small boy outside near her husband).   UPDATE 01/10/12: Doing much better on carb/levo; tremor, gait, and mental abilities better.  No wearing off during day.  Sometimes wakes up in middle of night (2-3am) and has shaking, tremor, cannot fall asleep again.  Has snoring and daytime sleepiness.    PRIOR HPI: 72 year old right-handed female with history of diabetes, coronary artery disease, anxiety, care for hospital followup after possible transient ischemic attack.  10/31/11 - patient woke up, feeling confused, unable to organize and express her thoughts. Her daughter came to see her and was able to  understand her, but the patient seemed frustrated and confused and unable to express what she was trying to say. At some point the patient developed some slurred speech and increased tremor. She was taken to the emergency room on 11/01/11 at 12:40am, and enrolled in the TIA protocol.  CT, MRI, carotid u/s and TTE done.  Labs done.  Patient discharged.  For past 10 years, patient has had progressive postural tremor. Also has had intermittent episodes of internal sensation of restlessness and anxiety.  She has been treated with primidone since August 2012, without relief of symptoms. She is also on Effexor and Xanax for anxiety and depression.  Patient's daughter has noted progressive memory loss, confusional episodes over the last several years.  Balance and walking deteriorating.   REVIEW OF SYSTEMS: Full 14 system review of systems performed and notable only for fatigue swelling hearing loss ringing smears trouble swallowing, snoring incontinence urination problems joint pain joint swelling flushing feeling had memory loss confusion weakness difficulty swallowing insomnia and sleepiness anxiety.  ALLERGIES: Allergies  Allergen Reactions  . Aspirin     swelling    HOME MEDICATIONS: Outpatient Prescriptions Prior to Visit  Medication Sig Dispense Refill  . ALPRAZolam (XANAX) 0.5 MG tablet Take 0.5 mg by mouth at bedtime as needed.        . ezetimibe (ZETIA) 10 MG tablet Take 10 mg by mouth daily.        . metFORMIN (GLUCOPHAGE) 1000 MG tablet Take 1,000 mg by mouth 2 (two) times daily with a meal.        . pantoprazole (PROTONIX) 20 MG tablet  Take 20 mg by mouth daily.        Marland Kitchen venlafaxine (EFFEXOR-XR) 75 MG 24 hr capsule Take 75 mg by mouth daily.        . primidone (MYSOLINE) 50 MG tablet Take 50 mg by mouth at bedtime.         No facility-administered medications prior to visit.    PAST MEDICAL HISTORY: Past Medical History  Diagnosis Date  . Coronary artery disease     Mid LAD Stent  2001 Patent By cath on 08/10/2010  . Diabetes mellitus     Type II  . Dyslipidemia   . HTN (hypertension)   . Parkinson disease   . Anxiety     PAST SURGICAL HISTORY: Past Surgical History  Procedure Laterality Date  . Cholecystectomy    . Appendectomy    . Hysterectomy    . Coronary stent placement      FAMILY HISTORY: Family History  Problem Relation Age of Onset  . Heart failure Father     SOCIAL HISTORY:  History   Social History  . Marital Status: Married    Spouse Name: N/A    Number of Children: 3  . Years of Education: HS   Occupational History  . Retired    Social History Main Topics  . Smoking status: Never Smoker   . Smokeless tobacco: Not on file  . Alcohol Use: No  . Drug Use: No  . Sexually Active: Not on file   Other Topics Concern  . Not on file   Social History Narrative   PT lives at home with her spouse.   Caffeine Use- 3 to 4 cups daily     PHYSICAL EXAM  Filed Vitals:   03/25/13 1406  BP: 110/63  Pulse: 72  Temp: 98 F (36.7 C)  TempSrc: Oral  Height: 5\' 6"  (1.676 m)  Weight: 198 lb (89.812 kg)   Body mass index is 31.97 kg/(m^2).  EXAM: General: Patient is awake, alert and in no acute distress.  Well developed and groomed.   NEGATIVE MYERSON'S.  NEGATIVE SNOUT.  POSITIVE PALMOMENTAL ON RIGHT SIDE.   Cardiovascular: No carotid artery bruits.  Heart is regular rate and rhythm with no murmurs.   Trunk: Obese.   Neurologic Exam  Mental Status: Awake, alert. Language is fluent and comprehension intact. MMSE 29/30. MOCA 26/30. Cranial Nerves: Masked facial expression.   Pupils are equal and reactive to light.  Visual fields are full to confrontation.  Conjugate eye movements are full, saccadic .  Ptosis on the left.  Facial sensation and strength are symmetric.  Hearing is intact to finger rub.  Palate elevated symmetrically and uvula is midline.  Shoulder shrug is symmetric.  Tongue is midline. Tremor at the soft pallate.    Motor: INTERMITTENT LUE > RUE REST TREMOR. MINIMAL POSTURAL TREMOR OF BUE.  MILD HEAD TREMOR.  MILD COGWHEELING WITH REINFORCEMENT.  MOD BRADYKINESIA IN LUE > RUE; RLE > LLE. Normal bulk. Full strength in the upper and lower extremities.  No pronator drift. Sensory: Intact and symmetric to light touch. Coordination: No ataxia or dysmetria on finger-nose or rapid alternating movement testing. Gait and Station: DIFF STANDING UP FROM CHAIR. SLOW, SHORT STEPS. TREMOR WITH GAIT. INTERMITTENT FREEZING. ALMOST FALLS BACKWARD  IN HALLWAY. EN BLOC TURNING.    DIAGNOSTIC DATA (LABS, IMAGING, TESTING) - I reviewed patient records, labs, notes, testing and imaging myself where available.  Lab Results  Component Value Date   WBC 9.2 11/01/2011  HGB 12.9 11/01/2011   HCT 38.3 11/01/2011   MCV 87.2 11/01/2011   PLT 312 11/01/2011      Component Value Date/Time   NA 138 11/01/2011 0055   K 4.1 11/01/2011 0055   CL 102 11/01/2011 0055   CO2 25 11/01/2011 0055   GLUCOSE 218* 11/01/2011 0055   BUN 16 11/01/2011 0055   CREATININE 0.89 11/01/2011 0055   CALCIUM 9.7 11/01/2011 0055   PROT 7.3 08/10/2010 0149   ALBUMIN 3.7 08/10/2010 0149   AST 23 08/10/2010 0149   ALT 14 08/10/2010 0149   ALKPHOS 91 08/10/2010 0149   BILITOT 0.5 08/10/2010 0149   GFRNONAA 63* 11/01/2011 0055   GFRAA 73* 11/01/2011 0055   Lab Results  Component Value Date   CHOL 208* 11/01/2011   HDL 54 11/01/2011   LDLCALC 122* 11/01/2011   TRIG 158* 11/01/2011   CHOLHDL 3.9 11/01/2011   Lab Results  Component Value Date   HGBA1C 6.8* 11/01/2011   No results found for this basename: [id number]   Lab Results  Component Value Date   TSH 3.446 08/10/2010    ASSESSMENT AND PLAN  72 y.o. year old female  has a past medical history of Coronary artery disease; Diabetes mellitus; Dyslipidemia; HTN (hypertension); Parkinson disease; and Anxiety. here with Parkinson's disease. Patient's gait is worsening. I will set up physical  therapy. Her mood and OCD symptoms are worsening and I would like her to see psychiatry. Will increase carbidopa levodopa up to 6 tablets per day.   Orders Placed This Encounter  Procedures  . Ambulatory referral to Physical Therapy  . Ambulatory referral to Psychiatry      Suanne Marker, MD 03/25/2013, 2:46 PM Certified in Neurology, Neurophysiology and Neuroimaging  [facility] [address] [place], [place] [address] [phone]

## 2013-03-26 ENCOUNTER — Encounter: Payer: Self-pay | Admitting: Diagnostic Neuroimaging

## 2013-03-31 ENCOUNTER — Ambulatory Visit: Payer: Self-pay | Admitting: Diagnostic Neuroimaging

## 2013-04-08 ENCOUNTER — Ambulatory Visit: Payer: Medicare Other | Attending: Diagnostic Neuroimaging | Admitting: Physical Therapy

## 2013-04-08 DIAGNOSIS — R269 Unspecified abnormalities of gait and mobility: Secondary | ICD-10-CM | POA: Insufficient documentation

## 2013-04-08 DIAGNOSIS — IMO0001 Reserved for inherently not codable concepts without codable children: Secondary | ICD-10-CM | POA: Diagnosis not present

## 2013-04-09 ENCOUNTER — Ambulatory Visit: Payer: Medicare Other | Admitting: Physical Therapy

## 2013-04-09 DIAGNOSIS — IMO0001 Reserved for inherently not codable concepts without codable children: Secondary | ICD-10-CM | POA: Diagnosis not present

## 2013-04-09 DIAGNOSIS — R269 Unspecified abnormalities of gait and mobility: Secondary | ICD-10-CM | POA: Diagnosis not present

## 2013-04-10 DIAGNOSIS — I10 Essential (primary) hypertension: Secondary | ICD-10-CM | POA: Diagnosis not present

## 2013-04-10 DIAGNOSIS — F0280 Dementia in other diseases classified elsewhere without behavioral disturbance: Secondary | ICD-10-CM | POA: Diagnosis not present

## 2013-04-10 DIAGNOSIS — F411 Generalized anxiety disorder: Secondary | ICD-10-CM | POA: Diagnosis not present

## 2013-04-10 DIAGNOSIS — E785 Hyperlipidemia, unspecified: Secondary | ICD-10-CM | POA: Diagnosis not present

## 2013-04-10 DIAGNOSIS — G3183 Dementia with Lewy bodies: Secondary | ICD-10-CM | POA: Diagnosis not present

## 2013-04-16 ENCOUNTER — Ambulatory Visit: Payer: Medicare Other | Attending: Diagnostic Neuroimaging | Admitting: Physical Therapy

## 2013-04-16 DIAGNOSIS — IMO0001 Reserved for inherently not codable concepts without codable children: Secondary | ICD-10-CM | POA: Diagnosis not present

## 2013-04-16 DIAGNOSIS — R269 Unspecified abnormalities of gait and mobility: Secondary | ICD-10-CM | POA: Insufficient documentation

## 2013-04-17 ENCOUNTER — Ambulatory Visit: Payer: Medicare Other | Admitting: Physical Therapy

## 2013-04-23 ENCOUNTER — Ambulatory Visit: Payer: Medicare Other | Admitting: Physical Therapy

## 2013-04-25 ENCOUNTER — Ambulatory Visit: Payer: Medicare Other | Admitting: Physical Therapy

## 2013-04-30 ENCOUNTER — Ambulatory Visit: Payer: Medicare Other | Admitting: Physical Therapy

## 2013-05-01 ENCOUNTER — Ambulatory Visit: Payer: Medicare Other | Admitting: Physical Therapy

## 2013-05-02 ENCOUNTER — Ambulatory Visit: Payer: Medicare Other | Admitting: Physical Therapy

## 2013-05-07 ENCOUNTER — Ambulatory Visit: Payer: Medicare Other | Admitting: Physical Therapy

## 2013-05-08 ENCOUNTER — Ambulatory Visit: Payer: Medicare Other | Admitting: Physical Therapy

## 2013-05-09 ENCOUNTER — Encounter (HOSPITAL_COMMUNITY): Payer: Self-pay | Admitting: Psychiatry

## 2013-05-09 ENCOUNTER — Ambulatory Visit (INDEPENDENT_AMBULATORY_CARE_PROVIDER_SITE_OTHER): Payer: Medicare Other | Admitting: Psychiatry

## 2013-05-09 VITALS — BP 140/72 | HR 68 | Resp 16 | Ht 66.0 in | Wt 192.0 lb

## 2013-05-09 DIAGNOSIS — F4322 Adjustment disorder with anxiety: Secondary | ICD-10-CM

## 2013-05-09 DIAGNOSIS — F4323 Adjustment disorder with mixed anxiety and depressed mood: Secondary | ICD-10-CM

## 2013-05-09 MED ORDER — VENLAFAXINE HCL ER 75 MG PO CP24: 150.0000 mg | ORAL_CAPSULE | Freq: Every morning | ORAL | Status: DC

## 2013-05-09 MED ORDER — LORAZEPAM 0.5 MG PO TABS: 0.5000 mg | ORAL_TABLET | Freq: Three times a day (TID) | ORAL | Status: DC

## 2013-05-09 NOTE — Progress Notes (Signed)
Psychiatric Assessment Adult  Patient Identification:  [name] Date of Evaluation:  05/09/2013 Chief Complaint: my neurologist sent me History of Chief Complaint:  No chief complaint on file. this patient is a 72 year old white married mother whose been diagnosed over the last year or 2 with Parkinson's. The patient remains very active. She says she's here because she is trying to cope with some problems.her problems are that she has to urinate and gets very very anxious. Being out of the community without having a bathroom around scares her and makes her more anxious. According to her and her family she is chronically been anxious all her life. This is having Parkinson's she describes internal sensation of something moving all the time and what she thinks of as worsening anxiety. Importantly consistent with Parkinson's she's been having a problem sleeping for the last year. He does describe his interrupted sleep which he takes naps. The patient denies daily depression. She is eating well. She is good energy and a good sense of worth. Her concentration is good. She denies a feeling of being worthless. She's never been suicidal now or ever. The patient denies the use of alcohol. She denies any psychotic symptomatology. The patient denies ever having an episode of major depression nor of mania. The patient denies having any pain or any recent falls. She denies generalized anxiety disorder, panic disorder or OCD.  The patient denies any other significant stresses. She's happily married for 55 years has 3 children 6 grandchildren all of whom are doing well. Initially she is doing well. She denies any deaths. 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 She's also physical therapy for balance. The patient is making plans to attend a Parkinson's support group in the next few weeks. The patient denies the use of cigarettes. She denies any focal neurological symptomatology.  HPI Review of Systems Physical  Exam  Depressive Symptoms: anxiety,  (Hypo) Manic Symptoms:   Elevated Mood:  No Irritable Mood:  No Grandiosity:  No Distractibility:  No Labiality of Mood:  No Delusions:  No Hallucinations:  No Impulsivity:  No Sexually Inappropriate Behavior:  No Financial Extravagance:  No Flight of Ideas:  No  Anxiety Symptoms: Excessive Worry:  Yes Panic Symptoms:  No Agoraphobia:  No Obsessive Compulsive: No  Symptoms: None, Specific Phobias:  No Social Anxiety:  No  Psychotic Symptoms:  Hallucinations: No None Delusions:  No Paranoia:  No   Ideas of Reference:  No  PTSD Symptoms: Ever had a traumatic exposure:  No Had a traumatic exposure in the last month:  No Re-experiencing: No None Hypervigilance:  No Hyperarousal: No None Avoidance: No None  Traumatic Brain Injury: No   Past Psychiatric History:  Past Medical History:   Past Medical History  Diagnosis Date  . Coronary artery disease     Mid LAD Stent 2001 Patent By cath on 08/10/2010  . Diabetes mellitus     Type II  . Dyslipidemia   . HTN (hypertension)   . Parkinson disease   . Anxiety    History of Loss of Consciousness:  No Seizure History:  No Cardiac History:  No Allergies:   Allergies  Allergen Reactions  . Aspirin     swelling   Current Medications:  Current Outpatient Prescriptions  Medication Sig Dispense Refill  . ALPRAZolam (XANAX) 0.5 MG tablet Take 0.5 mg by mouth at bedtime as needed.        . carbidopa-levodopa (SINEMET IR) 25-100 MG per tablet Take 1 tablet  by mouth 6 (six) times daily.  180 tablet  12  . ezetimibe (ZETIA) 10 MG tablet Take 10 mg by mouth daily.        . fenofibrate 160 MG tablet Take 160 mg by mouth daily.      . furosemide (LASIX) 40 MG tablet Take 40 mg by mouth daily.      Marland Kitchen LORazepam (ATIVAN) 0.5 MG tablet Take 1 tablet (0.5 mg total) by mouth every 8 (eight) hours. 1 bid for 1 week then 1 qam  2  qpm  90 tablet  3  . metFORMIN (GLUCOPHAGE) 1000 MG tablet  Take 1,000 mg by mouth 2 (two) times daily with a meal.        . nebivolol (BYSTOLIC) 5 MG tablet Take 5 mg by mouth daily.      . pantoprazole (PROTONIX) 20 MG tablet Take 20 mg by mouth daily.        . rivastigmine (EXELON) 4.6 mg/24hr Place 1 patch onto the skin daily.      Marland Kitchen venlafaxine XR (EFFEXOR-XR) 75 MG 24 hr capsule Take 2 capsules (150 mg total) by mouth every morning.  30 capsule  5   No current facility-administered medications for this visit.    Previous Psychotropic Medications:  Medication Dose   Effexor 75 mg XR                   Medical Consequences of Substance Abuse:   Legal Consequences of Substance Abuse:   Family Consequences of Substance Abuse:   Blackouts:   DT's:   Withdrawal Symptoms:   None  Social History: Current Place of Residence: [place] of Birth:  Family Members:  Marital Status:  Married Children:   Sons:   Daughters:  Relationships:  Education:  Corporate treasurer Problems/Performance:  Religious Beliefs/Practices:  History of Abuse:  Teacher, music History:   Legal History:  Hobbies/Interests:   Family History:   Family History  Problem Relation Age of Onset  . Heart failure Father     Mental Status Examination/Evaluation: Objective:  Appearance: Casual  Eye Contact::  Good  Speech:  Clear and Coherent  Volume:  Normal  Mood:  anxiety  Affect:  Congruent  Thought Process:  Coherent  Orientation:  Full (Time, Place, and Person)  Thought Content:  WDL  Suicidal Thoughts:  No  Homicidal Thoughts:  No  Judgement:  Good  Insight:  Good  Psychomotor Activity:  Normal  Akathisia:  No  Handed:  Right  AIMS (if indicated):    Assets:  Desire for Improvement    Laboratory/X-Ray Psychological Evaluation(s)        Assessment:  Axis I: Adjustment Disorder with Anxiety  AXIS I Adjustment Disorder with Anxiety  AXIS II Deferred  AXIS III Past Medical History  Diagnosis Date  .  Coronary artery disease     Mid LAD Stent 2001 Patent By cath on 08/10/2010  . Diabetes mellitus     Type II  . Dyslipidemia   . HTN (hypertension)   . Parkinson disease   . Anxiety      AXIS IV problems with access to health care services  AXIS V 61-70 mild symptoms   Treatment Plan/Recommendations:  Plan of Care: at this time we shall increase her Effexor to 150 mg XR in the morning and will begin her on Ativan 0.5 mg one twice a day for one week and then one in the morning and 2 at night thereafter. We  strongly encouraged her to go to a support group for Parkinson's and to continue remaining as active as she is.  Laboratory:    Psychotherapy:   Medications:   Routine PRN Medications:    Consultations:   Safety Concerns:    Other:      Lucas Mallow, MD 5/30/20149:20 AM

## 2013-05-14 ENCOUNTER — Ambulatory Visit: Payer: Medicare Other | Attending: Diagnostic Neuroimaging | Admitting: Physical Therapy

## 2013-05-14 DIAGNOSIS — IMO0001 Reserved for inherently not codable concepts without codable children: Secondary | ICD-10-CM | POA: Diagnosis not present

## 2013-05-14 DIAGNOSIS — R269 Unspecified abnormalities of gait and mobility: Secondary | ICD-10-CM | POA: Insufficient documentation

## 2013-05-15 ENCOUNTER — Ambulatory Visit: Payer: Medicare Other | Admitting: Physical Therapy

## 2013-05-15 DIAGNOSIS — E559 Vitamin D deficiency, unspecified: Secondary | ICD-10-CM | POA: Diagnosis not present

## 2013-05-15 DIAGNOSIS — E1149 Type 2 diabetes mellitus with other diabetic neurological complication: Secondary | ICD-10-CM | POA: Diagnosis not present

## 2013-05-15 DIAGNOSIS — R82998 Other abnormal findings in urine: Secondary | ICD-10-CM | POA: Diagnosis not present

## 2013-05-15 DIAGNOSIS — I10 Essential (primary) hypertension: Secondary | ICD-10-CM | POA: Diagnosis not present

## 2013-05-15 DIAGNOSIS — E785 Hyperlipidemia, unspecified: Secondary | ICD-10-CM | POA: Diagnosis not present

## 2013-05-16 ENCOUNTER — Ambulatory Visit: Payer: Medicare Other | Admitting: Physical Therapy

## 2013-05-21 ENCOUNTER — Ambulatory Visit: Payer: Medicare Other | Admitting: Physical Therapy

## 2013-05-22 ENCOUNTER — Ambulatory Visit: Payer: Medicare Other | Admitting: Physical Therapy

## 2013-05-28 ENCOUNTER — Ambulatory Visit: Payer: Medicare Other | Admitting: Physical Therapy

## 2013-05-29 ENCOUNTER — Other Ambulatory Visit: Payer: Self-pay

## 2013-05-29 ENCOUNTER — Ambulatory Visit: Payer: Medicare Other | Admitting: Physical Therapy

## 2013-05-29 DIAGNOSIS — G20 Parkinson's disease: Secondary | ICD-10-CM | POA: Diagnosis not present

## 2013-05-29 DIAGNOSIS — F0280 Dementia in other diseases classified elsewhere without behavioral disturbance: Secondary | ICD-10-CM | POA: Diagnosis not present

## 2013-05-29 DIAGNOSIS — E1149 Type 2 diabetes mellitus with other diabetic neurological complication: Secondary | ICD-10-CM | POA: Diagnosis not present

## 2013-05-29 DIAGNOSIS — Z1212 Encounter for screening for malignant neoplasm of rectum: Secondary | ICD-10-CM | POA: Diagnosis not present

## 2013-05-29 DIAGNOSIS — Z0000 Encounter for general adult medical examination without abnormal findings: Secondary | ICD-10-CM | POA: Diagnosis not present

## 2013-05-29 DIAGNOSIS — I10 Essential (primary) hypertension: Secondary | ICD-10-CM | POA: Diagnosis not present

## 2013-05-29 DIAGNOSIS — G3183 Dementia with Lewy bodies: Secondary | ICD-10-CM | POA: Diagnosis not present

## 2013-05-29 DIAGNOSIS — I259 Chronic ischemic heart disease, unspecified: Secondary | ICD-10-CM | POA: Diagnosis not present

## 2013-05-29 DIAGNOSIS — E559 Vitamin D deficiency, unspecified: Secondary | ICD-10-CM | POA: Diagnosis not present

## 2013-05-29 DIAGNOSIS — K5909 Other constipation: Secondary | ICD-10-CM | POA: Diagnosis not present

## 2013-05-29 MED ORDER — CARBIDOPA-LEVODOPA 25-100 MG PO TABS: 1.0000 | ORAL_TABLET | Freq: Every day | ORAL | Status: DC

## 2013-05-30 ENCOUNTER — Ambulatory Visit: Payer: Medicare Other | Admitting: Physical Therapy

## 2013-06-04 ENCOUNTER — Ambulatory Visit: Payer: Medicare Other | Admitting: Physical Therapy

## 2013-06-05 ENCOUNTER — Ambulatory Visit: Payer: Medicare Other | Admitting: Physical Therapy

## 2013-06-16 ENCOUNTER — Other Ambulatory Visit: Payer: Self-pay | Admitting: Diagnostic Neuroimaging

## 2013-07-09 ENCOUNTER — Ambulatory Visit (HOSPITAL_COMMUNITY): Payer: Self-pay | Admitting: Psychiatry

## 2013-07-21 ENCOUNTER — Ambulatory Visit: Payer: Self-pay | Admitting: Cardiovascular Disease

## 2013-07-28 ENCOUNTER — Telehealth: Payer: Self-pay | Admitting: Diagnostic Neuroimaging

## 2013-07-28 MED ORDER — RIVASTIGMINE 4.6 MG/24HR TD PT24: 1.0000 | MEDICATED_PATCH | Freq: Every day | TRANSDERMAL | Status: DC

## 2013-07-28 NOTE — Telephone Encounter (Signed)
Rx has been sent to [facility]

## 2013-08-14 ENCOUNTER — Ambulatory Visit (INDEPENDENT_AMBULATORY_CARE_PROVIDER_SITE_OTHER): Payer: Medicare Other | Admitting: Cardiovascular Disease

## 2013-08-14 ENCOUNTER — Encounter: Payer: Self-pay | Admitting: Cardiovascular Disease

## 2013-08-14 VITALS — BP 130/66 | HR 60 | Ht 66.0 in | Wt 197.0 lb

## 2013-08-14 DIAGNOSIS — I2510 Atherosclerotic heart disease of native coronary artery without angina pectoris: Secondary | ICD-10-CM

## 2013-08-14 NOTE — Patient Instructions (Addendum)
Your physician recommends that you schedule a follow-up appointment in: 1 YEAR. No changes were made today. 

## 2013-08-15 ENCOUNTER — Encounter: Payer: Self-pay | Admitting: Cardiovascular Disease

## 2013-08-15 NOTE — Progress Notes (Signed)
Patient ID: SKI POLICH, female   DOB: 15-May-1941, 72 y.o.   MRN: [id number]     HPI: [name], is a 72 y.o. female presents to the office for a one-year cardiology evaluation.  Ms. Mells has established coronary artery disease and in November 2001 underwent initial stenting of her proximal LAD. Socially she is undergoing additional catheterization in 2002 which showed a patent LAD stent with 40% narrowing proximal to the stent. Her last catheterization was in 2011 which showed a patent stent without significant other coronary artery disease. She had normal LV function. Her last echo Doppler study was in 2012 which showed an ejection fraction of 60-65%. She had mild LA dilatation. There is mild aortic insufficiency. She did have a nuclear perfusion study in November 2012 which showed normal perfusion.  Additional problems include reservoir this is disease. She also has a history of hyperlipidemia. Since I last saw her, apparently she no longer has been taking her Zetia. She has had recent increased anxiety with her husband dying on 07/20/2013. She denies recent episodes of chest pain. She denies shortness of breath. She does have some mild residual tremor despite her Parkinson medication.  Past Medical History  Diagnosis Date  . Coronary artery disease     Mid LAD Stent 2001 Patent By cath on 08/10/2010  . Diabetes mellitus     Type II  . Dyslipidemia   . HTN (hypertension)   . Parkinson disease   . Anxiety     Past Surgical History  Procedure Laterality Date  . Cholecystectomy    . Appendectomy    . Hysterectomy    . Coronary stent placement      Allergies  Allergen Reactions  . Aspirin     swelling    Current Outpatient Prescriptions  Medication Sig Dispense Refill  . ALPRAZolam (XANAX) 0.5 MG tablet Take 0.5 mg by mouth at bedtime as needed.        . carbidopa-levodopa (SINEMET IR) 25-100 MG per tablet Take 1 tablet by mouth 5 (five) times daily.      . furosemide (LASIX)  40 MG tablet Take 40 mg by mouth daily. Takes PRN      . LORazepam (ATIVAN) 0.5 MG tablet Take 0.5 mg by mouth every 8 (eight) hours. 1 bid      . metFORMIN (GLUCOPHAGE) 1000 MG tablet Take 1,000 mg by mouth 2 (two) times daily with a meal.        . nebivolol (BYSTOLIC) 5 MG tablet Take 5 mg by mouth daily.      . pantoprazole (PROTONIX) 20 MG tablet Take 20 mg by mouth daily. Takes PRN      . rivastigmine (EXELON) 4.6 mg/24hr Place 1 patch (4.6 mg total) onto the skin daily.  90 patch  1  . venlafaxine XR (EFFEXOR-XR) 75 MG 24 hr capsule Take 2 capsules (150 mg total) by mouth every morning.  30 capsule  5   No current facility-administered medications for this visit.    History   Social History  . Marital Status: Married    Spouse Name: N/A    Number of Children: 3  . Years of Education: HS   Occupational History  . Retired    Social History Main Topics  . Smoking status: Never Smoker   . Smokeless tobacco: Never Used  . Alcohol Use: No  . Drug Use: No  . Sexual Activity: Not on file   Other Topics Concern  . Not  on file   Social History Narrative   PT lives at home with her spouse.   Caffeine Use- 3 to 4 cups daily    Family History  Problem Relation Age of Onset  . Heart failure Father    Socially she lives alone. However her daughter is very close and checks on her daily. Her husband passed away with within the past month. She has 3 children 8 grandchildren.  ROS is negative for fevers, chills or night sweats. She does have some mild memory issues. She sees Dr. Kirt Boys for her Parkinson's disease. She does admit to still a residual tremor. She denies palpitations. She denies chest pain. She denies angina. She denies change of bowel or bowel or bladder habits. She denies edema. There is no claudication.   Other system review is negative.  PE BP 130/66  Pulse 60  Ht 5\' 6"  (1.676 m)  Wt 197 lb (89.359 kg)  BMI 31.81 kg/m2  General: Alert, oriented, no distress.    Skin: normal turgor, no rashes HEENT: Normocephalic, atraumatic. Pupils round and reactive; sclera anicteric;no lid lag.  Nose without nasal septal hypertrophy Mouth/Parynx benign; Mallinpatti scale 3 Neck: No JVD, no carotid briuts Lungs: clear to ausculatation and percussion; no wheezing or rales Heart: RRR, s1 s2 normal 1/6 systolic murmur at the Abdomen: Old cholecystomy scar. Suggestion of mild incisional hernia; soft, nontender; no hepatosplenomehaly, BS+; abdominal aorta nontender and not dilated by palpation. Pulses 2+ Extremities: no clubbing cyanosis or edema, Homan's sign negative  Neurologic: grossly nonfocal  ECG: Sinus rhythm at 60 beats per minute. No ectopy  LABS:  BMET    Component Value Date/Time   NA 138 11/01/2011 0055   K 4.1 11/01/2011 0055   CL 102 11/01/2011 0055   CO2 25 11/01/2011 0055   GLUCOSE 218* 11/01/2011 0055   BUN 16 11/01/2011 0055   CREATININE 0.89 11/01/2011 0055   CALCIUM 9.7 11/01/2011 0055   GFRNONAA 63* 11/01/2011 0055   GFRAA 73* 11/01/2011 0055     Hepatic Function Panel     Component Value Date/Time   PROT 7.3 08/10/2010 0149   ALBUMIN 3.7 08/10/2010 0149   AST 23 08/10/2010 0149   ALT 14 08/10/2010 0149   ALKPHOS 91 08/10/2010 0149   BILITOT 0.5 08/10/2010 0149   BILIDIR 0.2 08/10/2010 0149   IBILI 0.3 08/10/2010 0149     CBC    Component Value Date/Time   WBC 9.2 11/01/2011 0055   RBC 4.39 11/01/2011 0055   HGB 12.9 11/01/2011 0055   HCT 38.3 11/01/2011 0055   PLT 312 11/01/2011 0055   MCV 87.2 11/01/2011 0055   MCH 29.4 11/01/2011 0055   MCHC 33.7 11/01/2011 0055   RDW 13.2 11/01/2011 0055   LYMPHSABS 2.1 11/01/2011 0055   MONOABS 0.9 11/01/2011 0055   EOSABS 0.1 11/01/2011 0055   BASOSABS 0.0 11/01/2011 0055     BNP    Component Value Date/Time   PROBNP 50.0 08/09/2010 2359    Lipid Panel     Component Value Date/Time   CHOL 208* 11/01/2011 0413   TRIG 158* 11/01/2011 0413   HDL 54 11/01/2011 0413    CHOLHDL 3.9 11/01/2011 0413   VLDL 32 11/01/2011 0413   LDLCALC 122* 11/01/2011 0413     RADIOLOGY: No results found.    ASSESSMENT AND PLAN: From a cardiac standpoint, Ms. Istre continues to do fairly well. She is status post stenting of her LAD in November 2001 and this has  remained patent. She has not had significant development of recurrent coronary obstructive disease. Her last nuclear perfusion study in November 2012 showed entirely normal perfusion. She does have issues now predominantly with her Parkinson's. Her blood pressure is well-controlled the the past she had been on lipid lowering therapy but apparently no longer is taking Zetia. I will try to obtain results of her most recent laboratory.  As long as she remains cardiovascularly  stable, I will see her in one year for followup evaluation.     Lennette Bihari, MD, [facility]  08/15/2013 8:20 AM

## 2013-09-11 DIAGNOSIS — G20 Parkinson's disease: Secondary | ICD-10-CM | POA: Diagnosis not present

## 2013-09-11 DIAGNOSIS — E785 Hyperlipidemia, unspecified: Secondary | ICD-10-CM | POA: Diagnosis not present

## 2013-09-11 DIAGNOSIS — E1149 Type 2 diabetes mellitus with other diabetic neurological complication: Secondary | ICD-10-CM | POA: Diagnosis not present

## 2013-09-11 DIAGNOSIS — Z1331 Encounter for screening for depression: Secondary | ICD-10-CM | POA: Diagnosis not present

## 2013-09-11 DIAGNOSIS — I10 Essential (primary) hypertension: Secondary | ICD-10-CM | POA: Diagnosis not present

## 2013-09-11 DIAGNOSIS — F0280 Dementia in other diseases classified elsewhere without behavioral disturbance: Secondary | ICD-10-CM | POA: Diagnosis not present

## 2013-09-11 DIAGNOSIS — Z6832 Body mass index (BMI) 32.0-32.9, adult: Secondary | ICD-10-CM | POA: Diagnosis not present

## 2013-09-17 DIAGNOSIS — H0019 Chalazion unspecified eye, unspecified eyelid: Secondary | ICD-10-CM | POA: Diagnosis not present

## 2013-09-23 ENCOUNTER — Telehealth: Payer: Self-pay | Admitting: Diagnostic Neuroimaging

## 2013-09-24 DIAGNOSIS — H01119 Allergic dermatitis of unspecified eye, unspecified eyelid: Secondary | ICD-10-CM | POA: Diagnosis not present

## 2013-09-25 NOTE — Telephone Encounter (Signed)
Daughter is concerned patient is getting worse. Requesting to be seen before 10/08/13 for med management. Advised patient has been placed on wait list. Advised would consult w/ Dr. Marjory Lies when he returns on 09/29/13. Daughter agreed.

## 2013-10-02 DIAGNOSIS — H0019 Chalazion unspecified eye, unspecified eyelid: Secondary | ICD-10-CM | POA: Diagnosis not present

## 2013-10-08 ENCOUNTER — Encounter (INDEPENDENT_AMBULATORY_CARE_PROVIDER_SITE_OTHER): Payer: Self-pay

## 2013-10-08 ENCOUNTER — Ambulatory Visit (INDEPENDENT_AMBULATORY_CARE_PROVIDER_SITE_OTHER): Payer: Medicare Other | Admitting: Diagnostic Neuroimaging

## 2013-10-08 ENCOUNTER — Encounter: Payer: Self-pay | Admitting: Diagnostic Neuroimaging

## 2013-10-08 VITALS — BP 137/71 | HR 67 | Temp 98.0°F | Ht 66.0 in | Wt 194.0 lb

## 2013-10-08 DIAGNOSIS — G20 Parkinson's disease: Secondary | ICD-10-CM | POA: Diagnosis not present

## 2013-10-08 MED ORDER — AMANTADINE HCL 100 MG PO CAPS
100.0000 mg | ORAL_CAPSULE | Freq: Two times a day (BID) | ORAL | Status: DC
Start: 2013-10-08 — End: 2014-01-20

## 2013-10-08 NOTE — Patient Instructions (Signed)
Try amantadine 100mg  daily x 2-4 weeks, then increase to twice a day.

## 2013-10-08 NOTE — Progress Notes (Signed)
[facility]  PATIENT: [name] DOB: 05-27-1941  REFERRING CLINICIAN:  HISTORY FROM: patient, daughter REASON FOR VISIT: follow up   HISTORICAL  CHIEF COMPLAINT:  Chief Complaint  Patient presents with  . Follow-up    PD    HISTORY OF PRESENT ILLNESS:   UPDATE 10/08/13: Since last visit, patient's husband passed away, and patient struggled with significant grieving and stress. She's finally come to terms and is doing better with mood and cognitive ability. Patient continues on carbidopa/levodopa, one and half tablets in the morning, one half tablets at noon, half tablet at 3 PM and 2 tablets in the evening. Patient is very attuned to her on off fluctuations and wearing off and is able to adjust her medications on daily basis to manage this. Her mood and anxiety have improved. She's taking Effexor 75 mg at bedtime and lorazepam 0.5 mg tabs, 2 tablets at bedtime. This controls her symptoms quite well. Patient's daughter has noted that tremor is slightly increasing over time. Patient also feels spasms in her abdominal region.  UPDATE 03/25/13: since last visit patient's tremor, balance and gait have worsened. Her memory is slightly worse. She's also becoming very obsessive compulsive. She has difficulty with going to the bathroom and may sit on the toilet for 1 or 2 hours at a time. She's become obsessed with the timing and scheduling going to the bathroom.  UPDATE 06/24/12: Now complaining of right leg pain (behind knee) severe pain since Sunday. Also intermittent since past 4-6 weeks. Started before starting exelon patch. Some cramps in her thighs. No back pain. Increased carb/levo is helping. Not tried clonazepam yet. Exelon patch seems to help her cognitive symptoms.   UPDATE 05/13/12: Cognitive problems, navigation abilities, perseveration and short term memory loss now more problematic.Seems to be progressing at rapid pace per family. Poor sleep at night. More  anxiety at night. Carb/levo working, but tends to wear off before next dose. Now takes 4 tabs per day (with meals + 1 qhs). Yesterday had visual hallucination (saw a small boy outside near her husband).   UPDATE 01/10/12: Doing much better on carb/levo; tremor, gait, and mental abilities better.  No wearing off during day.  Sometimes wakes up in middle of night (2-3am) and has shaking, tremor, cannot fall asleep again.  Has snoring and daytime sleepiness.   PRIOR HPI: 72 year old right-handed female with history of diabetes, coronary artery disease, anxiety, care for hospital followup after possible transient ischemic attack.  10/31/11 - patient woke up, feeling confused, unable to organize and express her thoughts. Her daughter came to see her and was able to understand her, but the patient seemed frustrated and confused and unable to express what she was trying to say. At some point the patient developed some slurred speech and increased tremor. She was taken to the emergency room on 11/01/11 at 12:40am, and enrolled in the TIA protocol.  CT, MRI, carotid u/s and TTE done.  Labs done.  Patient discharged.  For past 10 years, patient has had progressive postural tremor. Also has had intermittent episodes of internal sensation of restlessness and anxiety.  She has been treated with primidone since August 2012, without relief of symptoms. She is also on Effexor and Xanax for anxiety and depression.  Patient's daughter has noted progressive memory loss, confusional episodes over the last several years.  Balance and walking deteriorating.   REVIEW OF SYSTEMS: Full 14 system review of systems performed and notable only for fatigue swelling hearing  loss ringing smears trouble swallowing, snoring incontinence urination problems joint pain joint swelling flushing feeling had memory loss confusion weakness difficulty swallowing insomnia and sleepiness anxiety.  ALLERGIES: Allergies  Allergen Reactions  .  Aspirin     swelling    HOME MEDICATIONS: Outpatient Prescriptions Prior to Visit  Medication Sig Dispense Refill  . carbidopa-levodopa (SINEMET IR) 25-100 MG per tablet Take 1 tablet by mouth 5 (five) times daily.      Marland Kitchen LORazepam (ATIVAN) 0.5 MG tablet Take 0.5 mg by mouth every 8 (eight) hours. 1 bid      . metFORMIN (GLUCOPHAGE) 1000 MG tablet Take 1,000 mg by mouth 2 (two) times daily with a meal.        . nebivolol (BYSTOLIC) 5 MG tablet Take 5 mg by mouth daily.      . rivastigmine (EXELON) 4.6 mg/24hr Place 1 patch (4.6 mg total) onto the skin daily.  90 patch  1  . venlafaxine XR (EFFEXOR-XR) 75 MG 24 hr capsule Take 2 capsules (150 mg total) by mouth every morning.  30 capsule  5  . ALPRAZolam (XANAX) 0.5 MG tablet Take 0.5 mg by mouth at bedtime as needed.        . furosemide (LASIX) 40 MG tablet Take 40 mg by mouth daily. Takes PRN      . pantoprazole (PROTONIX) 20 MG tablet Take 20 mg by mouth daily. Takes PRN       No facility-administered medications prior to visit.    PAST MEDICAL HISTORY: Past Medical History  Diagnosis Date  . Coronary artery disease     Mid LAD Stent 2001 Patent By cath on 08/10/2010  . Diabetes mellitus     Type II  . Dyslipidemia   . HTN (hypertension)   . Parkinson disease   . Anxiety     PAST SURGICAL HISTORY: Past Surgical History  Procedure Laterality Date  . Cholecystectomy    . Appendectomy    . Hysterectomy    . Coronary stent placement      FAMILY HISTORY: Family History  Problem Relation Age of Onset  . Heart failure Father     SOCIAL HISTORY:  History   Social History  . Marital Status: Married    Spouse Name: N/A    Number of Children: 3  . Years of Education: HS   Occupational History  . Retired    Social History Main Topics  . Smoking status: Never Smoker   . Smokeless tobacco: Never Used  . Alcohol Use: No  . Drug Use: No  . Sexual Activity: Not on file   Other Topics Concern  . Not on file    Social History Narrative   PT lives at home with her spouse.   Caffeine Use- 3 to 4 cups daily     PHYSICAL EXAM  Filed Vitals:   10/08/13 1124  BP: 137/71  Pulse: 67  Temp: 98 F (36.7 C)  TempSrc: Oral  Height: 5\' 6"  (1.676 m)  Weight: 194 lb (87.998 kg)   Body mass index is 31.33 kg/(m^2).  EXAM: General: Patient is awake, alert and in no acute distress.  Well developed and groomed. Cardiovascular: No carotid artery bruits.  Heart is regular rate and rhythm with no murmurs.  Neurologic Exam  Mental Status: Awake, alert. Language is fluent and comprehension intact. MOCA 27/30. Cranial Nerves: Masked facial expression. Pupils are equal and reactive to light.  Visual fields are full to confrontation.  Conjugate eye movements  are full, saccadic .  Ptosis on the left.  Facial sensation and strength are symmetric.  Hearing is intact to finger rub.  Palate elevated symmetrically and uvula is midline.  Shoulder shrug is symmetric.  Tongue is midline. Tremor at the soft pallate.  Motor: INTERMITTENT LUE > RUE REST TREMOR. MINIMAL POSTURAL TREMOR OF BUE.  MILD HEAD TREMOR.  MILD COGWHEELING WITH REINFORCEMENT.  MOD BRADYKINESIA IN LUE > RUE; RLE > LLE. Normal bulk. Full strength in the upper and lower extremities.  No pronator drift. Sensory: Intact and symmetric to light touch. Coordination: No ataxia or dysmetria on finger-nose or rapid alternating movement testing. Gait and Station: DIFF STANDING UP FROM CHAIR. SLOW, SHORT STEPS. TREMOR WITH GAIT. TIMED UP AND GO TEST (10FT) = 19 SECONDS.   DIAGNOSTIC DATA (LABS, IMAGING, TESTING) - I reviewed patient records, labs, notes, testing and imaging myself where available.  Lab Results  Component Value Date   WBC 9.2 11/01/2011   HGB 12.9 11/01/2011   HCT 38.3 11/01/2011   MCV 87.2 11/01/2011   PLT 312 11/01/2011      Component Value Date/Time   NA 138 11/01/2011 0055   K 4.1 11/01/2011 0055   CL 102 11/01/2011 0055   CO2 25  11/01/2011 0055   GLUCOSE 218* 11/01/2011 0055   BUN 16 11/01/2011 0055   CREATININE 0.89 11/01/2011 0055   CALCIUM 9.7 11/01/2011 0055   PROT 7.3 08/10/2010 0149   ALBUMIN 3.7 08/10/2010 0149   AST 23 08/10/2010 0149   ALT 14 08/10/2010 0149   ALKPHOS 91 08/10/2010 0149   BILITOT 0.5 08/10/2010 0149   GFRNONAA 63* 11/01/2011 0055   GFRAA 73* 11/01/2011 0055   Lab Results  Component Value Date   CHOL 208* 11/01/2011   HDL 54 11/01/2011   LDLCALC 122* 11/01/2011   TRIG 158* 11/01/2011   CHOLHDL 3.9 11/01/2011   Lab Results  Component Value Date   HGBA1C 6.8* 11/01/2011   No results found for this basename: [id number]   Lab Results  Component Value Date   TSH 3.446 08/10/2010    ASSESSMENT AND PLAN  72 y.o. year old female  has a past medical history of Coronary artery disease; Diabetes mellitus; Dyslipidemia; HTN (hypertension); Parkinson disease; and Anxiety. here with Parkinson's disease.   PLAN: - continue carbidopa/levodopa up to 6 tablets per day - trial of amantadine - in future, may consider azilect, but would have to transition effexor to another SSRI (e.g. Sertraline, citalopram, paroxetine).  Return in about 3 months (around 01/08/2014).    Suanne Marker, MD 10/08/2013, 12:22 PM Certified in Neurology, Neurophysiology and Neuroimaging  [facility] [address] [place], [place] [address] [phone]

## 2013-10-20 ENCOUNTER — Ambulatory Visit: Payer: BLUE CROSS/BLUE SHIELD | Admitting: Diagnostic Neuroimaging

## 2013-10-31 DIAGNOSIS — H0019 Chalazion unspecified eye, unspecified eyelid: Secondary | ICD-10-CM | POA: Diagnosis not present

## 2013-11-04 DIAGNOSIS — K5900 Constipation, unspecified: Secondary | ICD-10-CM | POA: Diagnosis not present

## 2013-11-13 ENCOUNTER — Ambulatory Visit: Payer: Medicare Other | Attending: Diagnostic Neuroimaging | Admitting: Physical Therapy

## 2013-11-13 DIAGNOSIS — H0019 Chalazion unspecified eye, unspecified eyelid: Secondary | ICD-10-CM | POA: Diagnosis not present

## 2013-11-13 DIAGNOSIS — R269 Unspecified abnormalities of gait and mobility: Secondary | ICD-10-CM | POA: Insufficient documentation

## 2013-11-13 DIAGNOSIS — IMO0001 Reserved for inherently not codable concepts without codable children: Secondary | ICD-10-CM | POA: Insufficient documentation

## 2013-11-24 ENCOUNTER — Ambulatory Visit: Payer: Medicare Other | Admitting: Physical Therapy

## 2013-12-15 DIAGNOSIS — K5730 Diverticulosis of large intestine without perforation or abscess without bleeding: Secondary | ICD-10-CM | POA: Diagnosis not present

## 2013-12-15 DIAGNOSIS — D126 Benign neoplasm of colon, unspecified: Secondary | ICD-10-CM | POA: Diagnosis not present

## 2013-12-15 DIAGNOSIS — Z86010 Personal history of colonic polyps: Secondary | ICD-10-CM | POA: Diagnosis not present

## 2013-12-15 DIAGNOSIS — K648 Other hemorrhoids: Secondary | ICD-10-CM | POA: Diagnosis not present

## 2013-12-19 ENCOUNTER — Other Ambulatory Visit: Payer: Self-pay

## 2013-12-19 DIAGNOSIS — Z1231 Encounter for screening mammogram for malignant neoplasm of breast: Secondary | ICD-10-CM

## 2014-01-02 ENCOUNTER — Ambulatory Visit: Payer: Medicare Other | Attending: Diagnostic Neuroimaging | Admitting: Physical Therapy

## 2014-01-02 DIAGNOSIS — IMO0001 Reserved for inherently not codable concepts without codable children: Secondary | ICD-10-CM | POA: Diagnosis not present

## 2014-01-02 DIAGNOSIS — R269 Unspecified abnormalities of gait and mobility: Secondary | ICD-10-CM | POA: Insufficient documentation

## 2014-01-07 ENCOUNTER — Ambulatory Visit: Payer: Medicare Other | Admitting: Physical Therapy

## 2014-01-07 DIAGNOSIS — IMO0001 Reserved for inherently not codable concepts without codable children: Secondary | ICD-10-CM | POA: Diagnosis not present

## 2014-01-07 DIAGNOSIS — R269 Unspecified abnormalities of gait and mobility: Secondary | ICD-10-CM | POA: Diagnosis not present

## 2014-01-09 ENCOUNTER — Ambulatory Visit: Payer: Medicare Other | Admitting: Physical Therapy

## 2014-01-09 DIAGNOSIS — R269 Unspecified abnormalities of gait and mobility: Secondary | ICD-10-CM | POA: Diagnosis not present

## 2014-01-09 DIAGNOSIS — IMO0001 Reserved for inherently not codable concepts without codable children: Secondary | ICD-10-CM | POA: Diagnosis not present

## 2014-01-13 DIAGNOSIS — I259 Chronic ischemic heart disease, unspecified: Secondary | ICD-10-CM | POA: Diagnosis not present

## 2014-01-13 DIAGNOSIS — Z23 Encounter for immunization: Secondary | ICD-10-CM | POA: Diagnosis not present

## 2014-01-13 DIAGNOSIS — F0280 Dementia in other diseases classified elsewhere without behavioral disturbance: Secondary | ICD-10-CM | POA: Diagnosis not present

## 2014-01-13 DIAGNOSIS — Z6832 Body mass index (BMI) 32.0-32.9, adult: Secondary | ICD-10-CM | POA: Diagnosis not present

## 2014-01-13 DIAGNOSIS — G20 Parkinson's disease: Secondary | ICD-10-CM | POA: Diagnosis not present

## 2014-01-13 DIAGNOSIS — E785 Hyperlipidemia, unspecified: Secondary | ICD-10-CM | POA: Diagnosis not present

## 2014-01-13 DIAGNOSIS — I10 Essential (primary) hypertension: Secondary | ICD-10-CM | POA: Diagnosis not present

## 2014-01-13 DIAGNOSIS — E1149 Type 2 diabetes mellitus with other diabetic neurological complication: Secondary | ICD-10-CM | POA: Diagnosis not present

## 2014-01-14 ENCOUNTER — Ambulatory Visit: Payer: Medicare Other | Attending: Diagnostic Neuroimaging | Admitting: Physical Therapy

## 2014-01-14 DIAGNOSIS — R269 Unspecified abnormalities of gait and mobility: Secondary | ICD-10-CM | POA: Diagnosis not present

## 2014-01-14 DIAGNOSIS — IMO0001 Reserved for inherently not codable concepts without codable children: Secondary | ICD-10-CM | POA: Insufficient documentation

## 2014-01-15 ENCOUNTER — Ambulatory Visit: Payer: Self-pay

## 2014-01-16 ENCOUNTER — Ambulatory Visit: Payer: Medicare Other | Admitting: Physical Therapy

## 2014-01-20 ENCOUNTER — Ambulatory Visit (INDEPENDENT_AMBULATORY_CARE_PROVIDER_SITE_OTHER): Payer: Medicare Other | Admitting: Diagnostic Neuroimaging

## 2014-01-20 ENCOUNTER — Encounter: Payer: Self-pay | Admitting: Diagnostic Neuroimaging

## 2014-01-20 VITALS — BP 125/62 | HR 66 | Temp 97.5°F | Ht 66.0 in | Wt 194.0 lb

## 2014-01-20 DIAGNOSIS — R441 Visual hallucinations: Secondary | ICD-10-CM

## 2014-01-20 DIAGNOSIS — G20A1 Parkinson's disease without dyskinesia, without mention of fluctuations: Secondary | ICD-10-CM

## 2014-01-20 DIAGNOSIS — H5316 Psychophysical visual disturbances: Secondary | ICD-10-CM | POA: Diagnosis not present

## 2014-01-20 DIAGNOSIS — G20 Parkinson's disease: Secondary | ICD-10-CM | POA: Diagnosis not present

## 2014-01-20 MED ORDER — CARBIDOPA-LEVODOPA 25-100 MG PO TABS: 1.5000 | ORAL_TABLET | Freq: Four times a day (QID) | ORAL | Status: DC

## 2014-01-20 NOTE — Progress Notes (Signed)
[facility]  PATIENT: [name] DOB: Apr 10, 1941  REFERRING CLINICIAN:  HISTORY FROM: patient, daughter x 2 REASON FOR VISIT: follow up   HISTORICAL  CHIEF COMPLAINT:  Chief Complaint  Patient presents with  . Follow-up    PD    HISTORY OF PRESENT ILLNESS:   UPDATE 01/20/14: Since last visit, tried amantadine, but it gave her flu-like symptoms, confusion, bad side effects. She took x 1 week then stopped. Few months a ago, also began to have intermittent visual hallucinations ("amish people, dogs, animals, children"), mainly at night. These are non-threatening, and she usually goes to sleep after seeing them. She usually can tell that they are not real. Anxiety is stable. Depression is still present. More balance difficulties. More wearing off of carb/levo (usually after 4 hours). Still driving. Not using cane/walker. Is going to PT for exercises.  UPDATE 10/08/13: Since last visit, patient's husband passed away, and patient struggled with significant grieving and stress. She's finally come to terms and is doing better with mood and cognitive ability. Patient continues on carbidopa/levodopa, one and half tablets in the morning, one half tablets at noon, half tablet at 3 PM and 2 tablets in the evening. Patient is very attuned to her on off fluctuations and wearing off and is able to adjust her medications on daily basis to manage this. Her mood and anxiety have improved. She's taking Effexor 75 mg at bedtime and lorazepam 0.5 mg tabs, 2 tablets at bedtime. This controls her symptoms quite well. Patient's daughter has noted that tremor is slightly increasing over time. Patient also feels spasms in her abdominal region.  UPDATE 03/25/13: since last visit patient's tremor, balance and gait have worsened. Her memory is slightly worse. She's also becoming very obsessive compulsive. She has difficulty with going to the bathroom and may sit on the toilet for 1 or 2 hours at a  time. She's become obsessed with the timing and scheduling going to the bathroom.  UPDATE 06/24/12: Now complaining of right leg pain (behind knee) severe pain since Sunday. Also intermittent since past 4-6 weeks. Started before starting exelon patch. Some cramps in her thighs. No back pain. Increased carb/levo is helping. Not tried clonazepam yet. Exelon patch seems to help her cognitive symptoms.   UPDATE 05/13/12: Cognitive problems, navigation abilities, perseveration and short term memory loss now more problematic.Seems to be progressing at rapid pace per family. Poor sleep at night. More anxiety at night. Carb/levo working, but tends to wear off before next dose. Now takes 4 tabs per day (with meals + 1 qhs). Yesterday had visual hallucination (saw a small boy outside near her husband).   UPDATE 01/10/12: Doing much better on carb/levo; tremor, gait, and mental abilities better.  No wearing off during day.  Sometimes wakes up in middle of night (2-3am) and has shaking, tremor, cannot fall asleep again.  Has snoring and daytime sleepiness.   PRIOR HPI: 73 year old right-handed female with history of diabetes, coronary artery disease, anxiety, care for hospital followup after possible transient ischemic attack.  10/31/11 - patient woke up, feeling confused, unable to organize and express her thoughts. Her daughter came to see her and was able to understand her, but the patient seemed frustrated and confused and unable to express what she was trying to say. At some point the patient developed some slurred speech and increased tremor. She was taken to the emergency room on 11/01/11 at 12:40am, and enrolled in the TIA protocol.  CT, MRI,  carotid u/s and TTE done.  Labs done.  Patient discharged.  For past 10 years, patient has had progressive postural tremor. Also has had intermittent episodes of internal sensation of restlessness and anxiety.  She has been treated with primidone since August 2012, without  relief of symptoms. She is also on Effexor and Xanax for anxiety and depression.  Patient's daughter has noted progressive memory loss, confusional episodes over the last several years.  Balance and walking deteriorating.   REVIEW OF SYSTEMS: Full 14 system review of systems performed and notable only for hearing loss ringing in ears incontinence of bowels urgency incontinence or bladder memory loss weakness tremor depression anxiety hallucination walking difficulty joint pain coordination problem.   ALLERGIES: Allergies  Allergen Reactions  . Aspirin     swelling    HOME MEDICATIONS: Outpatient Prescriptions Prior to Visit  Medication Sig Dispense Refill  . LORazepam (ATIVAN) 0.5 MG tablet Take 0.5 mg by mouth every 8 (eight) hours. 1 bid      . metFORMIN (GLUCOPHAGE) 1000 MG tablet Take 1,000 mg by mouth 2 (two) times daily with a meal.        . nebivolol (BYSTOLIC) 5 MG tablet Take 5 mg by mouth daily.      . rivastigmine (EXELON) 4.6 mg/24hr Place 1 patch (4.6 mg total) onto the skin daily.  90 patch  1  . venlafaxine XR (EFFEXOR-XR) 75 MG 24 hr capsule Take 75 mg by mouth at bedtime.      . carbidopa-levodopa (SINEMET IR) 25-100 MG per tablet Take 1 tablet by mouth 5 (five) times daily.      Marland Kitchen amantadine (SYMMETREL) 100 MG capsule Take 1 capsule (100 mg total) by mouth 2 (two) times daily.  60 capsule  6   No facility-administered medications prior to visit.    PAST MEDICAL HISTORY: Past Medical History  Diagnosis Date  . Coronary artery disease     Mid LAD Stent 2001 Patent By cath on 08/10/2010  . Diabetes mellitus     Type II  . Dyslipidemia   . HTN (hypertension)   . Parkinson disease   . Anxiety     PAST SURGICAL HISTORY: Past Surgical History  Procedure Laterality Date  . Cholecystectomy    . Appendectomy    . Hysterectomy    . Coronary stent placement      FAMILY HISTORY: Family History  Problem Relation Age of Onset  . Heart failure Father     SOCIAL  HISTORY:  History   Social History  . Marital Status: Widowed    Spouse Name: N/A    Number of Children: 3  . Years of Education: HS   Occupational History  . Retired    Social History Main Topics  . Smoking status: Never Smoker   . Smokeless tobacco: Never Used  . Alcohol Use: No  . Drug Use: No  . Sexual Activity: Not on file   Other Topics Concern  . Not on file   Social History Narrative   PT lives at home with her spouse.   Caffeine Use- 3 to 4 cups daily     PHYSICAL EXAM  Filed Vitals:   01/20/14 1122  BP: 125/62  Pulse: 66  Temp: 97.5 F (36.4 C)  TempSrc: Oral  Height: 5\' 6"  (1.676 m)  Weight: 194 lb (87.998 kg)   Body mass index is 31.33 kg/(m^2).  EXAM: General: Patient is awake, alert and in no acute distress.  Well developed and  groomed. Cardiovascular: No carotid artery bruits.  Heart is regular rate and rhythm with no murmurs.  Neurologic Exam  Mental Status: Awake, alert. Language is fluent and comprehension intact. Cranial Nerves: Masked facial expression. Pupils are equal and reactive to light.  Visual fields are full to confrontation.  Conjugate eye movements are full, saccadic .  Ptosis on the left.  Facial sensation and strength are symmetric.  Hearing is intact to finger rub.  Palate elevated symmetrically and uvula is midline.  Shoulder shrug is symmetric.  Tongue is midline. Tremor at the soft pallate.  Motor: INTERMITTENT LUE > RUE REST TREMOR. MILD POSTURAL TREMOR OF BUE.  MILD HEAD TREMOR.  MILD COGWHEELING WITH REINFORCEMENT.  MOD BRADYKINESIA IN LUE > RUE; RLE > LLE. Normal bulk. Full strength in the upper and lower extremities.  No pronator drift. Sensory: Intact and symmetric to light touch. Coordination: No ataxia or dysmetria on finger-nose or rapid alternating movement testing. Gait and Station: DIFF STANDING UP FROM CHAIR. SLOW, SHORT STEPS. TREMOR WITH GAIT.   DIAGNOSTIC DATA (LABS, IMAGING, TESTING) - I reviewed patient  records, labs, notes, testing and imaging myself where available.  Lab Results  Component Value Date   WBC 9.2 11/01/2011   HGB 12.9 11/01/2011   HCT 38.3 11/01/2011   MCV 87.2 11/01/2011   PLT 312 11/01/2011      Component Value Date/Time   NA 138 11/01/2011 0055   K 4.1 11/01/2011 0055   CL 102 11/01/2011 0055   CO2 25 11/01/2011 0055   GLUCOSE 218* 11/01/2011 0055   BUN 16 11/01/2011 0055   CREATININE 0.89 11/01/2011 0055   CALCIUM 9.7 11/01/2011 0055   PROT 7.3 08/10/2010 0149   ALBUMIN 3.7 08/10/2010 0149   AST 23 08/10/2010 0149   ALT 14 08/10/2010 0149   ALKPHOS 91 08/10/2010 0149   BILITOT 0.5 08/10/2010 0149   GFRNONAA 63* 11/01/2011 0055   GFRAA 73* 11/01/2011 0055   Lab Results  Component Value Date   CHOL 208* 11/01/2011   HDL 54 11/01/2011   LDLCALC 122* 11/01/2011   TRIG 158* 11/01/2011   CHOLHDL 3.9 11/01/2011   Lab Results  Component Value Date   HGBA1C 6.8* 11/01/2011   No results found for this basename: [id number]   Lab Results  Component Value Date   TSH 3.446 08/10/2010    ASSESSMENT AND PLAN  73 y.o. year old female  has a past medical history of Coronary artery disease; Diabetes mellitus; Dyslipidemia; HTN (hypertension); Parkinson disease; and Anxiety. here with Parkinson's disease. Symptoms progressive. Also with visual hallucinations, may represent disease progression vs medication side effect (carb/levo).   PLAN: - adjust dosing of carbidopa/levodopa 1.5 tabs QID (7am, 11am, 3pm, 7pm) - consider reducing venlafaxine and changing to SSRI, which could then allow patient to try azilect in future - use rolling walker and get exercise - stop driving  Return in about 6 months (around 07/20/2014).    Penni Bombard, MD 05/17/3709, 62:69 PM Certified in Neurology, Neurophysiology and Neuroimaging  [facility] [address] [place], [place] [address] [phone]

## 2014-01-21 ENCOUNTER — Ambulatory Visit: Payer: Medicare Other | Admitting: Physical Therapy

## 2014-01-21 DIAGNOSIS — R269 Unspecified abnormalities of gait and mobility: Secondary | ICD-10-CM | POA: Diagnosis not present

## 2014-01-21 DIAGNOSIS — IMO0001 Reserved for inherently not codable concepts without codable children: Secondary | ICD-10-CM | POA: Diagnosis not present

## 2014-01-23 ENCOUNTER — Ambulatory Visit: Payer: Medicare Other | Admitting: Physical Therapy

## 2014-01-23 DIAGNOSIS — R269 Unspecified abnormalities of gait and mobility: Secondary | ICD-10-CM | POA: Diagnosis not present

## 2014-01-23 DIAGNOSIS — IMO0001 Reserved for inherently not codable concepts without codable children: Secondary | ICD-10-CM | POA: Diagnosis not present

## 2014-01-28 ENCOUNTER — Ambulatory Visit: Payer: Medicare Other | Admitting: Physical Therapy

## 2014-01-30 ENCOUNTER — Ambulatory Visit: Payer: Medicare Other | Admitting: Physical Therapy

## 2014-02-04 ENCOUNTER — Ambulatory Visit: Payer: Medicare Other | Admitting: Physical Therapy

## 2014-02-05 ENCOUNTER — Ambulatory Visit: Payer: Self-pay

## 2014-02-05 ENCOUNTER — Ambulatory Visit: Payer: Medicare Other | Admitting: Physical Therapy

## 2014-02-09 ENCOUNTER — Ambulatory Visit: Payer: Medicare Other | Attending: Diagnostic Neuroimaging | Admitting: Physical Therapy

## 2014-02-09 DIAGNOSIS — IMO0001 Reserved for inherently not codable concepts without codable children: Secondary | ICD-10-CM | POA: Diagnosis not present

## 2014-02-09 DIAGNOSIS — R269 Unspecified abnormalities of gait and mobility: Secondary | ICD-10-CM | POA: Diagnosis not present

## 2014-02-11 ENCOUNTER — Ambulatory Visit: Payer: Medicare Other | Admitting: Physical Therapy

## 2014-02-11 DIAGNOSIS — R269 Unspecified abnormalities of gait and mobility: Secondary | ICD-10-CM | POA: Diagnosis not present

## 2014-02-11 DIAGNOSIS — IMO0001 Reserved for inherently not codable concepts without codable children: Secondary | ICD-10-CM | POA: Diagnosis not present

## 2014-02-16 ENCOUNTER — Telehealth: Payer: Self-pay | Admitting: Diagnostic Neuroimaging

## 2014-02-16 NOTE — Telephone Encounter (Signed)
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  She said that if the order came from her doctor,medicare will cover.

## 2014-02-16 NOTE — Telephone Encounter (Signed)
They should get audiology referral from PCP. -VRP

## 2014-02-17 NOTE — Telephone Encounter (Signed)
Shared message below with daughter thru VM message

## 2014-02-18 ENCOUNTER — Ambulatory Visit: Payer: Medicare Other | Admitting: Physical Therapy

## 2014-02-18 DIAGNOSIS — R269 Unspecified abnormalities of gait and mobility: Secondary | ICD-10-CM | POA: Diagnosis not present

## 2014-02-18 DIAGNOSIS — IMO0001 Reserved for inherently not codable concepts without codable children: Secondary | ICD-10-CM | POA: Diagnosis not present

## 2014-02-19 ENCOUNTER — Ambulatory Visit: Payer: Medicare Other | Admitting: Physical Therapy

## 2014-02-19 DIAGNOSIS — R269 Unspecified abnormalities of gait and mobility: Secondary | ICD-10-CM | POA: Diagnosis not present

## 2014-02-19 DIAGNOSIS — IMO0001 Reserved for inherently not codable concepts without codable children: Secondary | ICD-10-CM | POA: Diagnosis not present

## 2014-02-24 DIAGNOSIS — H9319 Tinnitus, unspecified ear: Secondary | ICD-10-CM | POA: Diagnosis not present

## 2014-02-24 DIAGNOSIS — R42 Dizziness and giddiness: Secondary | ICD-10-CM | POA: Diagnosis not present

## 2014-02-24 DIAGNOSIS — H903 Sensorineural hearing loss, bilateral: Secondary | ICD-10-CM | POA: Diagnosis not present

## 2014-02-25 ENCOUNTER — Ambulatory Visit: Payer: Medicare Other | Admitting: Physical Therapy

## 2014-02-25 DIAGNOSIS — R269 Unspecified abnormalities of gait and mobility: Secondary | ICD-10-CM | POA: Diagnosis not present

## 2014-02-25 DIAGNOSIS — IMO0001 Reserved for inherently not codable concepts without codable children: Secondary | ICD-10-CM | POA: Diagnosis not present

## 2014-02-26 ENCOUNTER — Ambulatory Visit: Payer: Self-pay | Admitting: Physical Therapy

## 2014-02-26 ENCOUNTER — Ambulatory Visit
Admission: RE | Admit: 2014-02-26 | Discharge: 2014-02-26 | Disposition: A | Discharge status: Home / Self Care | Payer: Medicare Other | Source: Ambulatory Visit

## 2014-02-26 DIAGNOSIS — Z1231 Encounter for screening mammogram for malignant neoplasm of breast: Secondary | ICD-10-CM

## 2014-03-04 ENCOUNTER — Ambulatory Visit: Payer: Medicare Other | Admitting: Physical Therapy

## 2014-03-04 DIAGNOSIS — IMO0001 Reserved for inherently not codable concepts without codable children: Secondary | ICD-10-CM | POA: Diagnosis not present

## 2014-03-04 DIAGNOSIS — R269 Unspecified abnormalities of gait and mobility: Secondary | ICD-10-CM | POA: Diagnosis not present

## 2014-03-05 ENCOUNTER — Ambulatory Visit: Payer: Medicare Other | Admitting: Physical Therapy

## 2014-03-05 DIAGNOSIS — IMO0001 Reserved for inherently not codable concepts without codable children: Secondary | ICD-10-CM | POA: Diagnosis not present

## 2014-03-05 DIAGNOSIS — R269 Unspecified abnormalities of gait and mobility: Secondary | ICD-10-CM | POA: Diagnosis not present

## 2014-03-11 ENCOUNTER — Ambulatory Visit: Payer: Medicare Other | Admitting: Physical Therapy

## 2014-03-12 ENCOUNTER — Ambulatory Visit: Payer: Medicare Other | Attending: Diagnostic Neuroimaging | Admitting: Physical Therapy

## 2014-03-12 DIAGNOSIS — IMO0001 Reserved for inherently not codable concepts without codable children: Secondary | ICD-10-CM | POA: Insufficient documentation

## 2014-03-12 DIAGNOSIS — R269 Unspecified abnormalities of gait and mobility: Secondary | ICD-10-CM | POA: Diagnosis not present

## 2014-03-18 ENCOUNTER — Ambulatory Visit: Payer: Medicare Other | Admitting: Physical Therapy

## 2014-03-19 ENCOUNTER — Ambulatory Visit: Payer: Medicare Other | Admitting: Physical Therapy

## 2014-06-01 DIAGNOSIS — E559 Vitamin D deficiency, unspecified: Secondary | ICD-10-CM | POA: Diagnosis not present

## 2014-06-01 DIAGNOSIS — E785 Hyperlipidemia, unspecified: Secondary | ICD-10-CM | POA: Diagnosis not present

## 2014-06-01 DIAGNOSIS — I10 Essential (primary) hypertension: Secondary | ICD-10-CM | POA: Diagnosis not present

## 2014-06-01 DIAGNOSIS — E1149 Type 2 diabetes mellitus with other diabetic neurological complication: Secondary | ICD-10-CM | POA: Diagnosis not present

## 2014-06-05 DIAGNOSIS — Z0000 Encounter for general adult medical examination without abnormal findings: Secondary | ICD-10-CM | POA: Diagnosis not present

## 2014-06-05 DIAGNOSIS — G3183 Dementia with Lewy bodies: Secondary | ICD-10-CM | POA: Diagnosis not present

## 2014-06-05 DIAGNOSIS — E559 Vitamin D deficiency, unspecified: Secondary | ICD-10-CM | POA: Diagnosis not present

## 2014-06-05 DIAGNOSIS — F0280 Dementia in other diseases classified elsewhere without behavioral disturbance: Secondary | ICD-10-CM | POA: Diagnosis not present

## 2014-06-05 DIAGNOSIS — R82998 Other abnormal findings in urine: Secondary | ICD-10-CM | POA: Diagnosis not present

## 2014-06-05 DIAGNOSIS — E1149 Type 2 diabetes mellitus with other diabetic neurological complication: Secondary | ICD-10-CM | POA: Diagnosis not present

## 2014-06-05 DIAGNOSIS — R809 Proteinuria, unspecified: Secondary | ICD-10-CM | POA: Diagnosis not present

## 2014-06-05 DIAGNOSIS — G20 Parkinson's disease: Secondary | ICD-10-CM | POA: Diagnosis not present

## 2014-06-05 DIAGNOSIS — I10 Essential (primary) hypertension: Secondary | ICD-10-CM | POA: Diagnosis not present

## 2014-06-05 DIAGNOSIS — I259 Chronic ischemic heart disease, unspecified: Secondary | ICD-10-CM | POA: Diagnosis not present

## 2014-06-05 DIAGNOSIS — E785 Hyperlipidemia, unspecified: Secondary | ICD-10-CM | POA: Diagnosis not present

## 2014-06-09 DIAGNOSIS — Z1212 Encounter for screening for malignant neoplasm of rectum: Secondary | ICD-10-CM | POA: Diagnosis not present

## 2014-06-22 DIAGNOSIS — H2510 Age-related nuclear cataract, unspecified eye: Secondary | ICD-10-CM | POA: Diagnosis not present

## 2014-07-20 ENCOUNTER — Ambulatory Visit (INDEPENDENT_AMBULATORY_CARE_PROVIDER_SITE_OTHER): Payer: Medicare Other | Admitting: Diagnostic Neuroimaging

## 2014-07-20 ENCOUNTER — Encounter: Payer: Self-pay | Admitting: Diagnostic Neuroimaging

## 2014-07-20 VITALS — BP 120/67 | HR 61 | Temp 98.1°F | Ht 66.0 in | Wt 180.6 lb

## 2014-07-20 DIAGNOSIS — G20 Parkinson's disease: Secondary | ICD-10-CM | POA: Diagnosis not present

## 2014-07-20 MED ORDER — CARBIDOPA-LEVODOPA 25-100 MG PO TABS: 1.5000 | ORAL_TABLET | Freq: Four times a day (QID) | ORAL | Status: DC

## 2014-07-20 NOTE — Progress Notes (Signed)
[facility]  PATIENT: [name] DOB: 12/10/1941  REFERRING CLINICIAN:  HISTORY FROM: patient and daughter REASON FOR VISIT: follow up   HISTORICAL  CHIEF COMPLAINT:  No chief complaint on file.   HISTORY OF PRESENT ILLNESS:   UPDATE 07/20/14: Since last visit, doing much better. Now exercising daily (30 minutes) on a abike and with weights / bands. Also incr carb/levo is working better. Some occ wearing off, but pt can usually sense this and make small adjustments with timing / freq and this helps manage symptoms.   UPDATE 01/20/14: Since last visit, tried amantadine, but it gave her flu-like symptoms, confusion, bad side effects. She took x 1 week then stopped. Few months a ago, also began to have intermittent visual hallucinations ("amish people, dogs, animals, children"), mainly at night. These are non-threatening, and she usually goes to sleep after seeing them. She usually can tell that they are not real. Anxiety is stable. Depression is still present. More balance difficulties. More wearing off of carb/levo (usually after 4 hours). Still driving. Not using cane/walker. Is going to PT for exercises.  UPDATE 10/08/13: Since last visit, patient's husband passed away, and patient struggled with significant grieving and stress. She's finally come to terms and is doing better with mood and cognitive ability. Patient continues on carbidopa/levodopa, one and half tablets in the morning, one half tablets at noon, half tablet at 3 PM and 2 tablets in the evening. Patient is very attuned to her on off fluctuations and wearing off and is able to adjust her medications on daily basis to manage this. Her mood and anxiety have improved. She's taking Effexor 75 mg at bedtime and lorazepam 0.5 mg tabs, 2 tablets at bedtime. This controls her symptoms quite well. Patient's daughter has noted that tremor is slightly increasing over time. Patient also feels spasms in her abdominal  region.  UPDATE 03/25/13: since last visit patient's tremor, balance and gait have worsened. Her memory is slightly worse. She's also becoming very obsessive compulsive. She has difficulty with going to the bathroom and may sit on the toilet for 1 or 2 hours at a time. She's become obsessed with the timing and scheduling going to the bathroom.  UPDATE 06/24/12: Now complaining of right leg pain (behind knee) severe pain since Sunday. Also intermittent since past 4-6 weeks. Started before starting exelon patch. Some cramps in her thighs. No back pain. Increased carb/levo is helping. Not tried clonazepam yet. Exelon patch seems to help her cognitive symptoms.   UPDATE 05/13/12: Cognitive problems, navigation abilities, perseveration and short term memory loss now more problematic.Seems to be progressing at rapid pace per family. Poor sleep at night. More anxiety at night. Carb/levo working, but tends to wear off before next dose. Now takes 4 tabs per day (with meals + 1 qhs). Yesterday had visual hallucination (saw a small boy outside near her husband).   UPDATE 01/10/12: Doing much better on carb/levo; tremor, gait, and mental abilities better.  No wearing off during day.  Sometimes wakes up in middle of night (2-3am) and has shaking, tremor, cannot fall asleep again.  Has snoring and daytime sleepiness.   PRIOR HPI (11/23/11): 73 year old right-handed female with history of diabetes, coronary artery disease, anxiety, care for hospital followup after possible transient ischemic attack. 10/31/11 - patient woke up, feeling confused, unable to organize and express her thoughts. Her daughter came to see her and was able to understand her, but the patient seemed frustrated and confused  and unable to express what she was trying to say. At some point the patient developed some slurred speech and increased tremor. She was taken to the emergency room on 11/01/11 at 12:40am, and enrolled in the TIA protocol.  CT, MRI,  carotid u/s and TTE done.  Labs done.  Patient discharged. For past 10 years, patient has had progressive postural tremor. Also has had intermittent episodes of internal sensation of restlessness and anxiety.  She has been treated with primidone since August 2012, without relief of symptoms. She is also on Effexor and Xanax for anxiety and depression.  Patient's daughter has noted progressive memory loss, confusional episodes over the last several years.  Balance and walking deteriorating.   REVIEW OF SYSTEMS: Full 14 system review of systems performed and notable only for hearing loss ringing in ears incontinence of bowels incontinence of bladder memory loss weakness tremor depression anxiety hallucination walking difficulty coordination problem aching muscles.   ALLERGIES: Allergies  Allergen Reactions  . Aspirin     swelling    HOME MEDICATIONS: Outpatient Prescriptions Prior to Visit  Medication Sig Dispense Refill  . LORazepam (ATIVAN) 0.5 MG tablet Take 0.5 mg by mouth every 8 (eight) hours. 1 bid      . metFORMIN (GLUCOPHAGE) 1000 MG tablet Take 1,000 mg by mouth 2 (two) times daily with a meal.        . nebivolol (BYSTOLIC) 5 MG tablet Take 5 mg by mouth daily.      . rivastigmine (EXELON) 4.6 mg/24hr Place 1 patch (4.6 mg total) onto the skin daily.  90 patch  1  . venlafaxine XR (EFFEXOR-XR) 75 MG 24 hr capsule Take 75 mg by mouth at bedtime.      . carbidopa-levodopa (SINEMET IR) 25-100 MG per tablet Take 1.5 tablets by mouth 4 (four) times daily.  540 tablet  4   No facility-administered medications prior to visit.    PAST MEDICAL HISTORY: Past Medical History  Diagnosis Date  . Coronary artery disease     Mid LAD Stent 2001 Patent By cath on 08/10/2010  . Diabetes mellitus     Type II  . Dyslipidemia   . HTN (hypertension)   . Parkinson disease   . Anxiety     PAST SURGICAL HISTORY: Past Surgical History  Procedure Laterality Date  . Cholecystectomy    .  Appendectomy    . Hysterectomy    . Coronary stent placement      FAMILY HISTORY: Family History  Problem Relation Age of Onset  . Heart failure Father     SOCIAL HISTORY:  History   Social History  . Marital Status: Widowed    Spouse Name: N/A    Number of Children: 3  . Years of Education: HS   Occupational History  . Retired    Social History Main Topics  . Smoking status: Never Smoker   . Smokeless tobacco: Never Used  . Alcohol Use: No  . Drug Use: No  . Sexual Activity: Not on file   Other Topics Concern  . Not on file   Social History Narrative   PT lives at home with her spouse.   Caffeine Use- 3 to 4 cups daily     PHYSICAL EXAM  Filed Vitals:   07/20/14 1145  BP: 120/67  Pulse: 61  Temp: 98.1 F (36.7 C)  TempSrc: Oral  Height: 5\' 6"  (1.676 m)  Weight: 180 lb 9.6 oz (81.92 kg)   Body mass index  is 29.16 kg/(m^2).  EXAM: General: Patient is awake, alert and in no acute distress.  Well developed and groomed. Cardiovascular: No carotid artery bruits.  Heart is regular rate and rhythm with no murmurs.  Neurologic Exam  Mental Status: Awake, alert. Language is fluent and comprehension intact. Cranial Nerves: Masked facial expression. Pupils are equal and reactive to light.  Visual fields are full to confrontation.  Conjugate eye movements are full, saccadic .  Ptosis on the left.  Facial sensation and strength are symmetric.  Hearing is intact to finger rub.  Palate elevated symmetrically and uvula is midline.  Shoulder shrug is symmetric.  Tongue is midline. Tremor at the soft pallate.  Motor: INTERMITTENT LUE > RUE MILD REST TREMOR. MILD POSTURAL TREMOR OF BUE.  MILD HEAD TREMOR.  MILD COGWHEELING WITH REINFORCEMENT.  MOD BRADYKINESIA IN LUE > RUE; RLE > LLE. Normal bulk. Full strength in the upper and lower extremities.  No pronator drift. Sensory: Intact and symmetric to light touch. Coordination: No ataxia or dysmetria on finger-nose or rapid  alternating movement testing. Gait and Station: ABLE TO STAND, WALK, TURN, SMOOTHLY. MILD LEFT HAND TREMOR WITH GAIT.   DIAGNOSTIC DATA (LABS, IMAGING, TESTING) - I reviewed patient records, labs, notes, testing and imaging myself where available.  Lab Results  Component Value Date   WBC 9.2 11/01/2011   HGB 12.9 11/01/2011   HCT 38.3 11/01/2011   MCV 87.2 11/01/2011   PLT 312 11/01/2011      Component Value Date/Time   NA 138 11/01/2011 0055   K 4.1 11/01/2011 0055   CL 102 11/01/2011 0055   CO2 25 11/01/2011 0055   GLUCOSE 218* 11/01/2011 0055   BUN 16 11/01/2011 0055   CREATININE 0.89 11/01/2011 0055   CALCIUM 9.7 11/01/2011 0055   PROT 7.3 08/10/2010 0149   ALBUMIN 3.7 08/10/2010 0149   AST 23 08/10/2010 0149   ALT 14 08/10/2010 0149   ALKPHOS 91 08/10/2010 0149   BILITOT 0.5 08/10/2010 0149   GFRNONAA 63* 11/01/2011 0055   GFRAA 73* 11/01/2011 0055   Lab Results  Component Value Date   CHOL 208* 11/01/2011   HDL 54 11/01/2011   LDLCALC 122* 11/01/2011   TRIG 158* 11/01/2011   CHOLHDL 3.9 11/01/2011   Lab Results  Component Value Date   HGBA1C 6.8* 11/01/2011   No results found for this basename: [id number]   Lab Results  Component Value Date   TSH 3.446 08/10/2010    ASSESSMENT AND PLAN  73 y.o. year old female  has a past medical history of Coronary artery disease; Diabetes mellitus; Dyslipidemia; HTN (hypertension); Parkinson disease; and Anxiety. here with Parkinson's disease. Slightly improved since last visit with increased carb/levo and increased exercise. Also with mild visual hallucinations.   PLAN: - continue carbidopa/levodopa 1.5 tabs QID (7am, 11am, 3pm, 7pm)  Return in about 6 months (around 01/20/2015).    Penni Bombard, MD 12/16/2692, 85:46 PM Certified in Neurology, Neurophysiology and Neuroimaging  [facility] [address] [place], [place] [address] [phone]

## 2014-07-20 NOTE — Patient Instructions (Signed)
Continue medications.  Continue exercise. Great work!

## 2014-08-20 ENCOUNTER — Telehealth: Payer: Self-pay | Admitting: Cardiovascular Disease

## 2014-08-24 NOTE — Telephone Encounter (Signed)
Closed enounter °

## 2014-08-25 ENCOUNTER — Telehealth: Payer: Self-pay | Admitting: *Deleted

## 2014-08-25 NOTE — Telephone Encounter (Signed)
Daughter, [name] calling about pt.  She has been weaning off effexor (that she has been taking for 15 yrs) over the last 3 wks and is now on 37.5mg  po qod per Dr. Brigitte Pulse.  Pt has noted sx of nausea, fatigue, cannot eat sicne weaning process. Questioning whether its the weaning of LT med?  Also not on ativan.  She continues with bladder spasms (not being able to urinate or having a hard time) this due to PD.  I recommended to call Dr. Brigitte Pulse about weaning effexor.   Would  Dr. [name] recommend other drug instead and if so what?  Can take tums or maalox for nausea?

## 2014-08-27 NOTE — Telephone Encounter (Signed)
Defer to Dr. Brigitte Pulse regarding these issues. -VRP

## 2014-08-28 NOTE — Telephone Encounter (Signed)
I called and relayed to [name] when daughter was not there.  [name] will check with daughter,who is in contact with Dr. Brigitte Pulse about different drug.  If needs anything will call us back.

## 2014-09-21 ENCOUNTER — Encounter: Payer: Self-pay | Admitting: *Deleted

## 2014-09-25 ENCOUNTER — Ambulatory Visit (INDEPENDENT_AMBULATORY_CARE_PROVIDER_SITE_OTHER): Payer: Medicare Other | Admitting: Cardiovascular Disease

## 2014-09-25 ENCOUNTER — Encounter: Payer: Self-pay | Admitting: Cardiovascular Disease

## 2014-09-25 VITALS — BP 134/68 | HR 54 | Ht 66.0 in | Wt 180.8 lb

## 2014-09-25 DIAGNOSIS — G20 Parkinson's disease: Secondary | ICD-10-CM

## 2014-09-25 DIAGNOSIS — E785 Hyperlipidemia, unspecified: Secondary | ICD-10-CM

## 2014-09-25 DIAGNOSIS — E119 Type 2 diabetes mellitus without complications: Secondary | ICD-10-CM | POA: Diagnosis not present

## 2014-09-25 DIAGNOSIS — G20A1 Parkinson's disease without dyskinesia, without mention of fluctuations: Secondary | ICD-10-CM

## 2014-09-25 DIAGNOSIS — F4323 Adjustment disorder with mixed anxiety and depressed mood: Secondary | ICD-10-CM | POA: Diagnosis not present

## 2014-09-25 DIAGNOSIS — I2510 Atherosclerotic heart disease of native coronary artery without angina pectoris: Secondary | ICD-10-CM | POA: Diagnosis not present

## 2014-09-25 NOTE — Progress Notes (Signed)
Patient ID: [name], female   DOB: 1941/05/14, 73 y.o.   MRN: [id number]     HPI: [name], is a 73 y.o. female presents to the office for a one-year cardiology evaluation.  [name] has established coronary artery disease and in November 2001 underwent initial stenting of her proximal LAD. Catheterization in 2002 which showed a patent LAD stent with 40% narrowing proximal to the stent. Her last catheterization was in 2011 which showed a patent stent without significant other coronary artery disease. She had normal LV function. Her last echo Doppler study was in 2012 which showed an ejection fraction of 60-65%. She had mild LA dilatation. There is mild aortic insufficiency. She did have a nuclear perfusion study in November 2012 which showed normal perfusion.  She  has a history of hyperlipidemia, hypertension, and type 2 diabetes mellitus.  There also is a history of anxiety. She has developed significant Parkinson's  disease and is followed by Dr. [name].  Recently, she has been on carbidopa, levodopa, 25/101.5 tablets 4 times a day.  She was changed from Effexor to Zoloft for her anxiety.  She also has a history of dementia and has been on Exelon patch to her skin daily.  She has been on metformin 1000 mg twice a day for diabetes mellitus.  She also takes Ativan for anxiety.  Presently, she denies chest pain.  She denies palpitations.  She tells me Dr. Brigitte Pulse checked laboratory 2 months ago.  Remotely she had taken Zetia, but is no longer taking this.  She has been using a stationary bike 25-30 minutes a day for Parkinson's disease and she was told that if she did not do this.  She would run the risk of not being able to walk.    Past Medical History  Diagnosis Date  . Coronary artery disease     Mid LAD Stent 2001 Patent By cath on 08/10/2010  . Diabetes mellitus     Type II  . Dyslipidemia   . HTN (hypertension)   . Parkinson disease   . Anxiety   . GERD (gastroesophageal reflux  disease)     Past Surgical History  Procedure Laterality Date  . Cholecystectomy  1963  . Appendectomy    . Hysterectomy  1989  . Transthoracic echocardiogram  2012    EF 60-65%, mild AV regurg, LA mildly dilated, PA peak pressure 36mmHg  . Nm myocar perf wall motion  2012    lexiscan - normal perfusion, EF 71%, low risk  . Cardiac catheterization  07/02/2000    normal LV function, 40-50% prox smooth LAD stenosis between 1st and 2nd diagonal, normal Cfx & RCA (Dr. Corky Downs)  . Coronary angioplasty with stent placement  10/31/2000    single vessel CAD (mild degree in mid-LAD 60% stenosis) - predilatation of plaque in mid LAD with cutting balloon angioplasty; 3.0x23mm Nir Elite stent to mid-LAD (Dr. Domenic Moras)  . Cardiac catheterization  02/20/2001    normal LV function, 50% stenosis prox to prox 3.5x34mm Nir Elite stent, 80% stenosis in a 1st septal perforating artery (Dr. Corky Downs)  . Cardiac catheterization  06/18/2006    patent LAD stent, dominant Cfx, low-normal EF (Dr. Domenic Moras)  . Cardiac catheterization  08/10/2010    patent mid LAD stent, nonobstructive CAD (Dr. Alma Friendly)    Allergies  Allergen Reactions  . Aspirin     swelling    Current Outpatient Prescriptions  Medication Sig Dispense Refill  .  carbidopa-levodopa (SINEMET IR) 25-100 MG per tablet Take 1.5 tablets by mouth 4 (four) times daily.  540 tablet  4  . LORazepam (ATIVAN) 0.5 MG tablet Take 0.5 mg by mouth every 8 (eight) hours. 1 bid      . metFORMIN (GLUCOPHAGE) 1000 MG tablet Take 1,000 mg by mouth 2 (two) times daily with a meal.        . nebivolol (BYSTOLIC) 5 MG tablet Take 5 mg by mouth daily.      . rivastigmine (EXELON) 4.6 mg/24hr Place 1 patch (4.6 mg total) onto the skin daily.  90 patch  1  . sertraline (ZOLOFT) 50 MG tablet Take 50 mg by mouth daily.       No current facility-administered medications for this visit.    History   Social History  . Marital Status: Widowed    Spouse Name: N/A     Number of Children: 3  . Years of Education: 12   Occupational History  . Retired    Social History Main Topics  . Smoking status: Never Smoker   . Smokeless tobacco: Never Used  . Alcohol Use: No  . Drug Use: No  . Sexual Activity: Not on file   Other Topics Concern  . Not on file   Social History Narrative   PT lives at home with her spouse.   Caffeine Use- 3 to 4 cups daily    Family History  Problem Relation Age of Onset  . Heart failure Father    Socially she lives alone. However her daughter is very close and checks on her daily. Her husband passed away with within the past month. She has 3 children 8 grandchildren.   ROS General: Negative; No fevers, chills, or night sweats;  HEENT: Negative; No changes in vision or hearing, sinus congestion, difficulty swallowing Pulmonary: Negative; No cough, wheezing, shortness of breath, hemoptysis Cardiovascular: Negative; No chest pain, presyncope, syncope, palpitations GI: Negative; No nausea, vomiting, diarrhea, or abdominal pain GU: Negative; No dysuria, hematuria, or difficulty voiding Musculoskeletal: Negative; no myalgias, joint pain, or weakness Hematologic/Oncology: Negative; no easy bruising, bleeding Endocrine: Negative; no heat/cold intolerance; no diabetes Neuro: Positive for Parkinson's disease with tremor and rigidity.; no changes in balance, headaches Skin: Negative; No rashes or skin lesions Psychiatric: Positive for anxiety.  Positive for dementia, mild Sleep: Negative; No snoring, daytime sleepiness, hypersomnolence, bruxism, restless legs, hypnogognic hallucinations, no cataplexy Other comprehensive 14 point system review is negative.   PE BP 134/68  Pulse 54  Ht 5\' 6"  (1.676 m)  Wt 180 lb 12.8 oz (82.01 kg)  BMI 29.20 kg/m2  General: Alert, oriented, no distress.  Skin: normal turgor, no rashes HEENT: Normocephalic, atraumatic. Pupils round and reactive; sclera anicteric;no lid lag.  Nose without  nasal septal hypertrophy Mouth/Parynx benign; Mallinpatti scale 3 Neck: No JVD, no carotid bruits with normal carotid Lungs: clear to ausculatation and percussion; no wheezing or rales Chest wall: Nontender to palpation Heart: RRR, s1 s2 normal 1/6 systolic murmur at the left sternal border, faint 1/6 diastolic murmur no S3 or S4 gallop. No rubs, thrills or heaves. Abdomen: Old cholecystomy scar. Suggestion of mild incisional hernia; soft, nontender; no hepatosplenomehaly, BS+; abdominal aorta nontender and not dilated by palpation. Pulses 2+ Extremities: Her legs are large, but there is not significant edema; no clubbing cyanosis or edema, Homan's sign negative  Neurologic: Positive for tremor.  Positive for mild cogwheel rigidity. Psychological: Normal affect  ECG (independently read by me and (: Sinus  bradycardia at 54 beats per minute. Q-wave in lead 3.  Normal intervals.  No significant ST changes  Prior September 2014  ECG: Sinus rhythm at 60 beats per minute. No ectopy  LABS:  BMET    Component Value Date/Time   NA 138 11/01/2011 0055   K 4.1 11/01/2011 0055   CL 102 11/01/2011 0055   CO2 25 11/01/2011 0055   GLUCOSE 218* 11/01/2011 0055   BUN 16 11/01/2011 0055   CREATININE 0.89 11/01/2011 0055   CALCIUM 9.7 11/01/2011 0055   GFRNONAA 63* 11/01/2011 0055   GFRAA 73* 11/01/2011 0055     Hepatic Function Panel     Component Value Date/Time   PROT 7.3 08/10/2010 0149   ALBUMIN 3.7 08/10/2010 0149   AST 23 08/10/2010 0149   ALT 14 08/10/2010 0149   ALKPHOS 91 08/10/2010 0149   BILITOT 0.5 08/10/2010 0149   BILIDIR 0.2 08/10/2010 0149   IBILI 0.3 08/10/2010 0149     CBC    Component Value Date/Time   WBC 9.2 11/01/2011 0055   RBC 4.39 11/01/2011 0055   HGB 12.9 11/01/2011 0055   HCT 38.3 11/01/2011 0055   PLT 312 11/01/2011 0055   MCV 87.2 11/01/2011 0055   MCH 29.4 11/01/2011 0055   MCHC 33.7 11/01/2011 0055   RDW 13.2 11/01/2011 0055   LYMPHSABS 2.1 11/01/2011  0055   MONOABS 0.9 11/01/2011 0055   EOSABS 0.1 11/01/2011 0055   BASOSABS 0.0 11/01/2011 0055     BNP    Component Value Date/Time   PROBNP 50.0 08/09/2010 2359    Lipid Panel     Component Value Date/Time   CHOL 208* 11/01/2011 0413   TRIG 158* 11/01/2011 0413   HDL 54 11/01/2011 0413   CHOLHDL 3.9 11/01/2011 0413   VLDL 32 11/01/2011 0413   LDLCALC 122* 11/01/2011 0413     RADIOLOGY: No results found.    ASSESSMENT AND PLAN: Ms. Althoff is now 73 years old and is 14 years status post stenting of her LAD at the ostium.  Her last cardiac catheterization was in 2011.  She's not having any anginal symptoms.  Her blood pressure today is controlled.  She is on Bystolic 5 mg and is tolerating this well.  She's not having palpitations.  She does have significant Parkinson disease followed by Dr. [name].  He last saw her in August 2015.  She is exercising daily 30 minutes on the bike with weights and bands.  She is tolerating her increased dose of carbidopa/levodopa therapy.  She does have hyperlipidemia but apparently her labs were stable, off Zetia.  I will try to obtain the results of the bloodwork from our review. She is diabetic, on metformin.  She does not have any CHF symptoms.  I will see her in one year for cardiology reevaluation or sooner if problems arise.  Time spent: 25 minutes  [name], MD, [facility]  09/25/2014 12:25 PM

## 2014-09-25 NOTE — Patient Instructions (Addendum)
Your physician wants you to follow-up in: 1 year. No changes were made today in your therapy.You will receive a reminder letter in the mail two months in advance. If you don't receive a letter, please call our office to schedule the follow-up appointment.

## 2014-10-06 ENCOUNTER — Ambulatory Visit: Payer: Medicare Other | Admitting: Physical Therapy

## 2014-10-07 DIAGNOSIS — Z780 Asymptomatic menopausal state: Secondary | ICD-10-CM | POA: Diagnosis not present

## 2014-10-07 DIAGNOSIS — E785 Hyperlipidemia, unspecified: Secondary | ICD-10-CM | POA: Diagnosis not present

## 2014-10-07 DIAGNOSIS — G3183 Dementia with Lewy bodies: Secondary | ICD-10-CM | POA: Diagnosis not present

## 2014-10-07 DIAGNOSIS — J069 Acute upper respiratory infection, unspecified: Secondary | ICD-10-CM | POA: Diagnosis not present

## 2014-10-07 DIAGNOSIS — Z23 Encounter for immunization: Secondary | ICD-10-CM | POA: Diagnosis not present

## 2014-10-07 DIAGNOSIS — Z9861 Coronary angioplasty status: Secondary | ICD-10-CM | POA: Diagnosis not present

## 2014-10-07 DIAGNOSIS — E1140 Type 2 diabetes mellitus with diabetic neuropathy, unspecified: Secondary | ICD-10-CM | POA: Diagnosis not present

## 2014-10-07 DIAGNOSIS — G20 Parkinson's disease: Secondary | ICD-10-CM | POA: Diagnosis not present

## 2014-10-07 DIAGNOSIS — E669 Obesity, unspecified: Secondary | ICD-10-CM | POA: Diagnosis not present

## 2014-10-07 DIAGNOSIS — I10 Essential (primary) hypertension: Secondary | ICD-10-CM | POA: Diagnosis not present

## 2014-11-19 DIAGNOSIS — Z6830 Body mass index (BMI) 30.0-30.9, adult: Secondary | ICD-10-CM | POA: Diagnosis not present

## 2014-11-19 DIAGNOSIS — Z9861 Coronary angioplasty status: Secondary | ICD-10-CM | POA: Diagnosis not present

## 2014-11-19 DIAGNOSIS — I10 Essential (primary) hypertension: Secondary | ICD-10-CM | POA: Diagnosis not present

## 2014-11-19 DIAGNOSIS — E785 Hyperlipidemia, unspecified: Secondary | ICD-10-CM | POA: Diagnosis not present

## 2015-01-25 ENCOUNTER — Ambulatory Visit: Payer: Medicare Other | Admitting: Diagnostic Neuroimaging

## 2015-01-26 ENCOUNTER — Telehealth: Payer: Self-pay | Admitting: *Deleted

## 2015-01-26 NOTE — Telephone Encounter (Signed)
Called the pt on the phone and was able to reschedule her appt off of Friday afternoon. Pt was agreeable and thanked me

## 2015-01-29 ENCOUNTER — Ambulatory Visit: Payer: Self-pay | Admitting: Diagnostic Neuroimaging

## 2015-01-29 ENCOUNTER — Encounter: Payer: Self-pay | Admitting: Diagnostic Neuroimaging

## 2015-01-29 ENCOUNTER — Ambulatory Visit (INDEPENDENT_AMBULATORY_CARE_PROVIDER_SITE_OTHER): Payer: Medicare Other | Admitting: Diagnostic Neuroimaging

## 2015-01-29 VITALS — BP 123/71 | HR 63 | Ht 66.0 in | Wt 183.3 lb

## 2015-01-29 DIAGNOSIS — R1310 Dysphagia, unspecified: Secondary | ICD-10-CM

## 2015-01-29 DIAGNOSIS — R269 Unspecified abnormalities of gait and mobility: Secondary | ICD-10-CM | POA: Diagnosis not present

## 2015-01-29 DIAGNOSIS — G20 Parkinson's disease: Secondary | ICD-10-CM

## 2015-01-29 MED ORDER — CARBIDOPA-LEVODOPA ER 23.75-95 MG PO CPCR: 3.0000 | ORAL_CAPSULE | Freq: Three times a day (TID) | ORAL | Status: DC

## 2015-01-29 NOTE — Progress Notes (Signed)
[facility]  PATIENT: [name] DOB: [birth year]  REFERRING CLINICIAN:  HISTORY FROM: patient and daughter REASON FOR VISIT: follow up   HISTORICAL  CHIEF COMPLAINT:  Chief Complaint  Patient presents with  . Follow-up    parkinsons disease     HISTORY OF PRESENT ILLNESS:   UPDATE 01/29/15: Since last visit patient having more problems with wearing off. She is taking her carbidopa/levodopa at 6 AM, noon, 4 PM and 7 PM. Patient feeling maximum wearing off symptoms mainly around 10 AM. She notes increasing tremor in arms, body, as well as internal tremulous feeling. Patient also having more problems with difficulty in urination, constipation, balance. Patient continues to live her on her own. Her daughter checks on her several times per week. Patient still able to do light housework. Patient not driving or shopping on her own. Daughter helps with these functions.  UPDATE 07/20/14: Since last visit, doing much better. Now exercising daily (30 minutes) on a abike and with weights / bands. Also incr carb/levo is working better. Some occ wearing off, but pt can usually sense this and make small adjustments with timing / freq and this helps manage symptoms.   UPDATE 01/20/14: Since last visit, tried amantadine, but it gave her flu-like symptoms, confusion, bad side effects. She took x 1 week then stopped. Few months a ago, also began to have intermittent visual hallucinations ("amish people, dogs, animals, children"), mainly at night. These are non-threatening, and she usually goes to sleep after seeing them. She usually can tell that they are not real. Anxiety is stable. Depression is still present. More balance difficulties. More wearing off of carb/levo (usually after 4 hours). Still driving. Not using cane/walker. Is going to PT for exercises.  UPDATE 10/08/13: Since last visit, patient's husband passed away, and patient struggled with significant grieving and stress.  She's finally come to terms and is doing better with mood and cognitive ability. Patient continues on carbidopa/levodopa, one and half tablets in the morning, one half tablets at noon, half tablet at 3 PM and 2 tablets in the evening. Patient is very attuned to her on off fluctuations and wearing off and is able to adjust her medications on daily basis to manage this. Her mood and anxiety have improved. She's taking Effexor 75 mg at bedtime and lorazepam 0.5 mg tabs, 2 tablets at bedtime. This controls her symptoms quite well. Patient's daughter has noted that tremor is slightly increasing over time. Patient also feels spasms in her abdominal region.  UPDATE 03/25/13: since last visit patient's tremor, balance and gait have worsened. Her memory is slightly worse. She's also becoming very obsessive compulsive. She has difficulty with going to the bathroom and may sit on the toilet for 1 or 2 hours at a time. She's become obsessed with the timing and scheduling going to the bathroom.  UPDATE 06/24/12: Now complaining of right leg pain (behind knee) severe pain since Sunday. Also intermittent since past 4-6 weeks. Started before starting exelon patch. Some cramps in her thighs. No back pain. Increased carb/levo is helping. Not tried clonazepam yet. Exelon patch seems to help her cognitive symptoms.   UPDATE 05/13/12: Cognitive problems, navigation abilities, perseveration and short term memory loss now more problematic.Seems to be progressing at rapid pace per family. Poor sleep at night. More anxiety at night. Carb/levo working, but tends to wear off before next dose. Now takes 4 tabs per day (with meals + 1 qhs). Yesterday had visual hallucination (saw  a small boy outside near her husband).   UPDATE 01/10/12: Doing much better on carb/levo; tremor, gait, and mental abilities better.  No wearing off during day.  Sometimes wakes up in middle of night (2-3am) and has shaking, tremor, cannot fall asleep again.  Has  snoring and daytime sleepiness.   PRIOR HPI (11/23/11): 74 year old right-handed female with history of diabetes, coronary artery disease, anxiety, care for hospital followup after possible transient ischemic attack. 10/31/11 - patient woke up, feeling confused, unable to organize and express her thoughts. Her daughter came to see her and was able to understand her, but the patient seemed frustrated and confused and unable to express what she was trying to say. At some point the patient developed some slurred speech and increased tremor. She was taken to the emergency room on 11/01/11 at 12:40am, and enrolled in the TIA protocol.  CT, MRI, carotid u/s and TTE done.  Labs done.  Patient discharged. For past 10 years, patient has had progressive postural tremor. Also has had intermittent episodes of internal sensation of restlessness and anxiety.  She has been treated with primidone since August 2012, without relief of symptoms. She is also on Effexor and Xanax for anxiety and depression.  Patient's daughter has noted progressive memory loss, confusional episodes over the last several years.  Balance and walking deteriorating.   REVIEW OF SYSTEMS: Full 14 system review of systems performed and notable only for fatigue trouble swallowing constipation diarrhea aching muscles muscle cramps anxiety hallucinations weakness tremor memory loss incontinence frequent and difficult urination.   ALLERGIES: Allergies  Allergen Reactions  . Aspirin     swelling    HOME MEDICATIONS: Outpatient Prescriptions Prior to Visit  Medication Sig Dispense Refill  . metFORMIN (GLUCOPHAGE) 1000 MG tablet Take 1,000 mg by mouth 2 (two) times daily with a meal.      . nebivolol (BYSTOLIC) 5 MG tablet Take 5 mg by mouth daily.    . rivastigmine (EXELON) 4.6 mg/24hr Place 1 patch (4.6 mg total) onto the skin daily. 90 patch 1  . sertraline (ZOLOFT) 50 MG tablet Take 50 mg by mouth daily.    . carbidopa-levodopa (SINEMET IR)  25-100 MG per tablet Take 1.5 tablets by mouth 4 (four) times daily. 540 tablet 4  . LORazepam (ATIVAN) 0.5 MG tablet Take 0.5 mg by mouth every 8 (eight) hours. 1 bid     No facility-administered medications prior to visit.    PAST MEDICAL HISTORY: Past Medical History  Diagnosis Date  . Coronary artery disease     Mid LAD Stent 2001 Patent By cath on 08/10/2010  . Diabetes mellitus     Type II  . Dyslipidemia   . HTN (hypertension)   . Parkinson disease   . Anxiety   . GERD (gastroesophageal reflux disease)     PAST SURGICAL HISTORY: Past Surgical History  Procedure Laterality Date  . Cholecystectomy  1963  . Appendectomy    . Hysterectomy  1989  . Transthoracic echocardiogram  2012    EF 60-65%, mild AV regurg, LA mildly dilated, PA peak pressure 44mmHg  . Nm myocar perf wall motion  2012    lexiscan - normal perfusion, EF 71%, low risk  . Cardiac catheterization  07/02/2000    normal LV function, 40-50% prox smooth LAD stenosis between 1st and 2nd diagonal, normal Cfx & RCA (Dr. Corky Downs)  . Coronary angioplasty with stent placement  10/31/2000    single vessel CAD (mild degree in mid-LAD  60% stenosis) - predilatation of plaque in mid LAD with cutting balloon angioplasty; 3.0x48mm Nir Elite stent to mid-LAD (Dr. Domenic Moras)  . Cardiac catheterization  02/20/2001    normal LV function, 50% stenosis prox to prox 3.5x4mm Nir Elite stent, 80% stenosis in a 1st septal perforating artery (Dr. Corky Downs)  . Cardiac catheterization  06/18/2006    patent LAD stent, dominant Cfx, low-normal EF (Dr. Domenic Moras)  . Cardiac catheterization  08/10/2010    patent mid LAD stent, nonobstructive CAD (Dr. Alma Friendly)    FAMILY HISTORY: Family History  Problem Relation Age of Onset  . Heart failure Father     SOCIAL HISTORY:  History   Social History  . Marital Status: Widowed    Spouse Name: N/A  . Number of Children: 3  . Years of Education: 12   Occupational History  . Retired     Social History Main Topics  . Smoking status: Never Smoker   . Smokeless tobacco: Never Used  . Alcohol Use: No  . Drug Use: No  . Sexual Activity: Not on file   Other Topics Concern  . Not on file   Social History Narrative   PT lives at home with her spouse.   Caffeine Use- 3 to 4 cups daily     PHYSICAL EXAM  Filed Vitals:   01/29/15 0850  BP: 123/71  Pulse: 63  Height: 5\' 6"  (1.676 m)  Weight: 183 lb 4.8 oz (83.144 kg)   Body mass index is 29.6 kg/(m^2).  EXAM: General: Patient is awake, alert and in no acute distress.  Well developed and groomed. Cardiovascular: No carotid artery bruits.  Heart is regular rate and rhythm with no murmurs.  Neurologic Exam  Mental Status: Awake, alert. Language is fluent and comprehension intact. Cranial Nerves: Masked facial expression. Pupils are equal and reactive to light.  Visual fields are full to confrontation.  Conjugate eye movements are full, saccadic .  Ptosis on the left.  Facial sensation and strength are symmetric.  Hearing is intact to finger rub.  Palate elevated symmetrically and uvula is midline.  Shoulder shrug is symmetric.  Tongue is midline. Tremor at the soft pallate.  Motor: INTERMITTENT LUE > RUE MILD REST TREMOR. MILD POSTURAL TREMOR OF BUE.  MILD HEAD TREMOR.  MILD COGWHEELING WITH REINFORCEMENT.  MOD BRADYKINESIA IN LUE > RUE; RLE > LLE. Normal bulk. Full strength in the upper and lower extremities.  No pronator drift. Sensory: Intact and symmetric to light touch. Coordination: No ataxia or dysmetria on finger-nose or rapid alternating movement testing. Gait and Station: ABLE TO STAND, WALK, TURN, SMOOTHLY. MILD LEFT HAND TREMOR WITH GAIT.   DIAGNOSTIC DATA (LABS, IMAGING, TESTING) - I reviewed patient records, labs, notes, testing and imaging myself where available.  Lab Results  Component Value Date   WBC 9.2 11/01/2011   HGB 12.9 11/01/2011   HCT 38.3 11/01/2011   MCV 87.2 11/01/2011   PLT 312  11/01/2011      Component Value Date/Time   NA 138 11/01/2011 0055   K 4.1 11/01/2011 0055   CL 102 11/01/2011 0055   CO2 25 11/01/2011 0055   GLUCOSE 218* 11/01/2011 0055   BUN 16 11/01/2011 0055   CREATININE 0.89 11/01/2011 0055   CALCIUM 9.7 11/01/2011 0055   PROT 7.3 08/10/2010 0149   ALBUMIN 3.7 08/10/2010 0149   AST 23 08/10/2010 0149   ALT 14 08/10/2010 0149   ALKPHOS 91 08/10/2010 0149   BILITOT  0.5 08/10/2010 0149   GFRNONAA 63* 11/01/2011 0055   GFRAA 73* 11/01/2011 0055   Lab Results  Component Value Date   CHOL 208* 11/01/2011   HDL 54 11/01/2011   LDLCALC 122* 11/01/2011   TRIG 158* 11/01/2011   CHOLHDL 3.9 11/01/2011   Lab Results  Component Value Date   HGBA1C 6.8* 11/01/2011   No results found for: [id number] Lab Results  Component Value Date   TSH 3.446 08/10/2010    ASSESSMENT AND PLAN  74 y.o. year old female  has a past medical history of Coronary artery disease; Diabetes mellitus; Dyslipidemia; HTN (hypertension); Parkinson disease; Anxiety; and GERD (gastroesophageal reflux disease). here with Parkinson's disease. Slightly improved since last visit with increased carb/levo and increased exercise. Also with mild visual hallucinations. More wearing off, even with QID dosing, so will try rytary.  PLAN: - stop carbidopa/levodopa  - start rytary (23.75 / 95) 3 capsules TID   Return in about 6 weeks (around 03/12/2015).    Penni Bombard, MD 9/62/2297, 9:89 AM Certified in Neurology, Neurophysiology and Neuroimaging  [facility] [address], [place], [place] [address] [phone]

## 2015-01-29 NOTE — Patient Instructions (Signed)
Finish last dose of carbidopa-levodopa tonight.  Tomorrow, start rytary (23.75 / 95): take 3 capsules three times per day (total 9 capsule per day) --> take at 6am, noon, 6pm.  Follow up in 4-6 weeks.

## 2015-02-01 ENCOUNTER — Telehealth: Payer: Self-pay | Admitting: *Deleted

## 2015-02-01 ENCOUNTER — Telehealth: Payer: Self-pay | Admitting: Cardiovascular Disease

## 2015-02-01 MED ORDER — CARBIDOPA-LEVODOPA ER 23.75-95 MG PO CPCR: 3.0000 | ORAL_CAPSULE | Freq: Three times a day (TID) | ORAL | Status: DC

## 2015-02-01 NOTE — Telephone Encounter (Signed)
Insurance company will no longer pay for her Bystolic.She need Dr Claiborne Billings office to call this number [phone] for formulary exception please.

## 2015-02-01 NOTE — Telephone Encounter (Signed)
Car/Lev needs to be written for 90 days sent to [facility]. It is much cheaper this way.

## 2015-02-01 NOTE — Telephone Encounter (Signed)
Rx has been sent  

## 2015-02-02 ENCOUNTER — Telehealth: Payer: Self-pay | Admitting: Diagnostic Neuroimaging

## 2015-02-02 DIAGNOSIS — R269 Unspecified abnormalities of gait and mobility: Secondary | ICD-10-CM | POA: Diagnosis not present

## 2015-02-02 DIAGNOSIS — I2510 Atherosclerotic heart disease of native coronary artery without angina pectoris: Secondary | ICD-10-CM | POA: Diagnosis not present

## 2015-02-02 DIAGNOSIS — G20 Parkinson's disease: Secondary | ICD-10-CM | POA: Diagnosis not present

## 2015-02-02 DIAGNOSIS — G4733 Obstructive sleep apnea (adult) (pediatric): Secondary | ICD-10-CM | POA: Diagnosis not present

## 2015-02-02 DIAGNOSIS — R1310 Dysphagia, unspecified: Secondary | ICD-10-CM | POA: Diagnosis not present

## 2015-02-02 DIAGNOSIS — F419 Anxiety disorder, unspecified: Secondary | ICD-10-CM | POA: Diagnosis not present

## 2015-02-02 NOTE — Telephone Encounter (Signed)
Rx was sent to [facility] yesterday.  I called back.  Spoke with Ms Kindel, she said [name] was not available.  Says [facility] should be sending [place] a fax today.  She is unsure what it is pertaining to.  Says her [place] ins has already spoken to her and advised the medication is covered.  Told her we will watch for the fax, and will be happy to provide any info they need.

## 2015-02-02 NOTE — Telephone Encounter (Signed)
Patient's daughter called to let us know that [employer] is sending a letter by fax for authorization on Rx Rytary 23.75-95 mg 9 per day so that [facility] can fill Rx.  Please call.

## 2015-02-03 ENCOUNTER — Telehealth: Payer: Self-pay | Admitting: Diagnostic Neuroimaging

## 2015-02-03 NOTE — Telephone Encounter (Signed)
[name] from [facility] is calling to inform you that patient has decided she is going to hold off on PT at this time.  She is going to try some home exercises.  If you have any further questions please call and advise.

## 2015-02-03 NOTE — Telephone Encounter (Signed)
Fax received today from [facility] asking for clinical info regarding why patient is taking Rytary.  I have provided them with this info.  They are reviewing request at this time and will call us back if anything further is needed.

## 2015-02-05 ENCOUNTER — Telehealth: Payer: Self-pay | Admitting: Diagnostic Neuroimaging

## 2015-02-05 DIAGNOSIS — I2510 Atherosclerotic heart disease of native coronary artery without angina pectoris: Secondary | ICD-10-CM | POA: Diagnosis not present

## 2015-02-05 DIAGNOSIS — G4733 Obstructive sleep apnea (adult) (pediatric): Secondary | ICD-10-CM | POA: Diagnosis not present

## 2015-02-05 DIAGNOSIS — F419 Anxiety disorder, unspecified: Secondary | ICD-10-CM | POA: Diagnosis not present

## 2015-02-05 DIAGNOSIS — R1310 Dysphagia, unspecified: Secondary | ICD-10-CM | POA: Diagnosis not present

## 2015-02-05 DIAGNOSIS — G20 Parkinson's disease: Secondary | ICD-10-CM | POA: Diagnosis not present

## 2015-02-05 DIAGNOSIS — R269 Unspecified abnormalities of gait and mobility: Secondary | ICD-10-CM | POA: Diagnosis not present

## 2015-02-05 NOTE — Telephone Encounter (Signed)
[name] with [employer] @ [phone], stated prior authorization has been approved for Rx Carbidopa-Levodopa ER (RYTARY) 23.75-95 MG CPCR.  Patient has been notified.  FYI

## 2015-02-08 DIAGNOSIS — R269 Unspecified abnormalities of gait and mobility: Secondary | ICD-10-CM | POA: Diagnosis not present

## 2015-02-08 DIAGNOSIS — G20 Parkinson's disease: Secondary | ICD-10-CM | POA: Diagnosis not present

## 2015-02-08 DIAGNOSIS — G4733 Obstructive sleep apnea (adult) (pediatric): Secondary | ICD-10-CM | POA: Diagnosis not present

## 2015-02-08 DIAGNOSIS — I2510 Atherosclerotic heart disease of native coronary artery without angina pectoris: Secondary | ICD-10-CM | POA: Diagnosis not present

## 2015-02-08 DIAGNOSIS — R1310 Dysphagia, unspecified: Secondary | ICD-10-CM | POA: Diagnosis not present

## 2015-02-08 DIAGNOSIS — F419 Anxiety disorder, unspecified: Secondary | ICD-10-CM | POA: Diagnosis not present

## 2015-02-08 NOTE — Telephone Encounter (Signed)
Left message to return a call. 

## 2015-02-10 DIAGNOSIS — E669 Obesity, unspecified: Secondary | ICD-10-CM | POA: Diagnosis not present

## 2015-02-10 DIAGNOSIS — E1140 Type 2 diabetes mellitus with diabetic neuropathy, unspecified: Secondary | ICD-10-CM | POA: Diagnosis not present

## 2015-02-10 DIAGNOSIS — E785 Hyperlipidemia, unspecified: Secondary | ICD-10-CM | POA: Diagnosis not present

## 2015-02-10 DIAGNOSIS — G20 Parkinson's disease: Secondary | ICD-10-CM | POA: Diagnosis not present

## 2015-02-10 DIAGNOSIS — I10 Essential (primary) hypertension: Secondary | ICD-10-CM | POA: Diagnosis not present

## 2015-02-10 DIAGNOSIS — Z1389 Encounter for screening for other disorder: Secondary | ICD-10-CM | POA: Diagnosis not present

## 2015-02-10 DIAGNOSIS — G3183 Dementia with Lewy bodies: Secondary | ICD-10-CM | POA: Diagnosis not present

## 2015-02-10 DIAGNOSIS — Z6830 Body mass index (BMI) 30.0-30.9, adult: Secondary | ICD-10-CM | POA: Diagnosis not present

## 2015-02-10 DIAGNOSIS — F329 Major depressive disorder, single episode, unspecified: Secondary | ICD-10-CM | POA: Diagnosis not present

## 2015-02-10 DIAGNOSIS — Z9861 Coronary angioplasty status: Secondary | ICD-10-CM | POA: Diagnosis not present

## 2015-02-10 DIAGNOSIS — K5909 Other constipation: Secondary | ICD-10-CM | POA: Diagnosis not present

## 2015-02-11 DIAGNOSIS — R1310 Dysphagia, unspecified: Secondary | ICD-10-CM | POA: Diagnosis not present

## 2015-02-11 DIAGNOSIS — I2510 Atherosclerotic heart disease of native coronary artery without angina pectoris: Secondary | ICD-10-CM | POA: Diagnosis not present

## 2015-02-11 DIAGNOSIS — R269 Unspecified abnormalities of gait and mobility: Secondary | ICD-10-CM | POA: Diagnosis not present

## 2015-02-11 DIAGNOSIS — G4733 Obstructive sleep apnea (adult) (pediatric): Secondary | ICD-10-CM | POA: Diagnosis not present

## 2015-02-11 DIAGNOSIS — F419 Anxiety disorder, unspecified: Secondary | ICD-10-CM | POA: Diagnosis not present

## 2015-02-11 DIAGNOSIS — G20 Parkinson's disease: Secondary | ICD-10-CM | POA: Diagnosis not present

## 2015-02-15 DIAGNOSIS — F419 Anxiety disorder, unspecified: Secondary | ICD-10-CM | POA: Diagnosis not present

## 2015-02-15 DIAGNOSIS — G4733 Obstructive sleep apnea (adult) (pediatric): Secondary | ICD-10-CM | POA: Diagnosis not present

## 2015-02-15 DIAGNOSIS — R269 Unspecified abnormalities of gait and mobility: Secondary | ICD-10-CM | POA: Diagnosis not present

## 2015-02-15 DIAGNOSIS — I2510 Atherosclerotic heart disease of native coronary artery without angina pectoris: Secondary | ICD-10-CM | POA: Diagnosis not present

## 2015-02-15 DIAGNOSIS — R1310 Dysphagia, unspecified: Secondary | ICD-10-CM | POA: Diagnosis not present

## 2015-02-15 DIAGNOSIS — G20 Parkinson's disease: Secondary | ICD-10-CM | POA: Diagnosis not present

## 2015-02-16 ENCOUNTER — Other Ambulatory Visit: Payer: Self-pay | Admitting: Internal Medicine

## 2015-02-16 DIAGNOSIS — R339 Retention of urine, unspecified: Secondary | ICD-10-CM | POA: Diagnosis not present

## 2015-02-16 DIAGNOSIS — R2240 Localized swelling, mass and lump, unspecified lower limb: Secondary | ICD-10-CM | POA: Diagnosis not present

## 2015-02-16 DIAGNOSIS — R609 Edema, unspecified: Secondary | ICD-10-CM | POA: Diagnosis not present

## 2015-02-16 DIAGNOSIS — Z6831 Body mass index (BMI) 31.0-31.9, adult: Secondary | ICD-10-CM | POA: Diagnosis not present

## 2015-02-16 DIAGNOSIS — R229 Localized swelling, mass and lump, unspecified: Principal | ICD-10-CM

## 2015-02-16 DIAGNOSIS — IMO0002 Reserved for concepts with insufficient information to code with codable children: Secondary | ICD-10-CM

## 2015-02-17 ENCOUNTER — Encounter (HOSPITAL_COMMUNITY): Payer: Self-pay

## 2015-02-17 DIAGNOSIS — K644 Residual hemorrhoidal skin tags: Secondary | ICD-10-CM | POA: Diagnosis not present

## 2015-02-17 DIAGNOSIS — K642 Third degree hemorrhoids: Secondary | ICD-10-CM | POA: Diagnosis not present

## 2015-02-17 DIAGNOSIS — K5900 Constipation, unspecified: Secondary | ICD-10-CM | POA: Diagnosis not present

## 2015-02-18 ENCOUNTER — Other Ambulatory Visit (HOSPITAL_COMMUNITY): Payer: Self-pay | Admitting: Internal Medicine

## 2015-02-18 ENCOUNTER — Ambulatory Visit (HOSPITAL_COMMUNITY)
Admission: RE | Admit: 2015-02-18 | Discharge: 2015-02-18 | Disposition: A | Discharge status: Home / Self Care | Payer: Medicare Other | Source: Ambulatory Visit | Attending: Vascular Surgery | Admitting: Vascular Surgery

## 2015-02-18 DIAGNOSIS — R609 Edema, unspecified: Secondary | ICD-10-CM | POA: Diagnosis present

## 2015-02-18 NOTE — Progress Notes (Signed)
Preliminary results phoned and given to [facility] at [employer]

## 2015-02-19 DIAGNOSIS — F419 Anxiety disorder, unspecified: Secondary | ICD-10-CM | POA: Diagnosis not present

## 2015-02-19 DIAGNOSIS — G4733 Obstructive sleep apnea (adult) (pediatric): Secondary | ICD-10-CM | POA: Diagnosis not present

## 2015-02-19 DIAGNOSIS — G20 Parkinson's disease: Secondary | ICD-10-CM | POA: Diagnosis not present

## 2015-02-19 DIAGNOSIS — I2510 Atherosclerotic heart disease of native coronary artery without angina pectoris: Secondary | ICD-10-CM | POA: Diagnosis not present

## 2015-02-19 DIAGNOSIS — R1310 Dysphagia, unspecified: Secondary | ICD-10-CM | POA: Diagnosis not present

## 2015-02-19 DIAGNOSIS — R269 Unspecified abnormalities of gait and mobility: Secondary | ICD-10-CM | POA: Diagnosis not present

## 2015-02-22 ENCOUNTER — Other Ambulatory Visit: Payer: Self-pay

## 2015-02-25 DIAGNOSIS — G20 Parkinson's disease: Secondary | ICD-10-CM | POA: Diagnosis not present

## 2015-02-25 DIAGNOSIS — G4733 Obstructive sleep apnea (adult) (pediatric): Secondary | ICD-10-CM | POA: Diagnosis not present

## 2015-02-25 DIAGNOSIS — R269 Unspecified abnormalities of gait and mobility: Secondary | ICD-10-CM | POA: Diagnosis not present

## 2015-02-25 DIAGNOSIS — R1310 Dysphagia, unspecified: Secondary | ICD-10-CM | POA: Diagnosis not present

## 2015-02-25 DIAGNOSIS — I2510 Atherosclerotic heart disease of native coronary artery without angina pectoris: Secondary | ICD-10-CM | POA: Diagnosis not present

## 2015-02-25 DIAGNOSIS — F419 Anxiety disorder, unspecified: Secondary | ICD-10-CM | POA: Diagnosis not present

## 2015-03-03 DIAGNOSIS — K641 Second degree hemorrhoids: Secondary | ICD-10-CM | POA: Diagnosis not present

## 2015-03-04 DIAGNOSIS — K5909 Other constipation: Secondary | ICD-10-CM | POA: Diagnosis not present

## 2015-03-04 DIAGNOSIS — M79604 Pain in right leg: Secondary | ICD-10-CM | POA: Diagnosis not present

## 2015-03-04 DIAGNOSIS — Z6831 Body mass index (BMI) 31.0-31.9, adult: Secondary | ICD-10-CM | POA: Diagnosis not present

## 2015-03-04 DIAGNOSIS — R609 Edema, unspecified: Secondary | ICD-10-CM | POA: Diagnosis not present

## 2015-03-08 ENCOUNTER — Ambulatory Visit
Admission: RE | Admit: 2015-03-08 | Discharge: 2015-03-08 | Disposition: A | Discharge status: Home / Self Care | Payer: Medicare Other | Source: Ambulatory Visit | Attending: Internal Medicine | Admitting: Internal Medicine

## 2015-03-08 ENCOUNTER — Other Ambulatory Visit: Payer: Self-pay | Admitting: Internal Medicine

## 2015-03-08 DIAGNOSIS — M25561 Pain in right knee: Secondary | ICD-10-CM

## 2015-03-08 DIAGNOSIS — M1711 Unilateral primary osteoarthritis, right knee: Secondary | ICD-10-CM | POA: Diagnosis not present

## 2015-03-08 DIAGNOSIS — M23300 Other meniscus derangements, unspecified lateral meniscus, right knee: Secondary | ICD-10-CM | POA: Diagnosis not present

## 2015-03-08 DIAGNOSIS — G20 Parkinson's disease: Secondary | ICD-10-CM | POA: Diagnosis not present

## 2015-03-08 DIAGNOSIS — I2510 Atherosclerotic heart disease of native coronary artery without angina pectoris: Secondary | ICD-10-CM | POA: Diagnosis not present

## 2015-03-08 DIAGNOSIS — M25461 Effusion, right knee: Secondary | ICD-10-CM | POA: Diagnosis not present

## 2015-03-08 DIAGNOSIS — R269 Unspecified abnormalities of gait and mobility: Secondary | ICD-10-CM | POA: Diagnosis not present

## 2015-03-08 DIAGNOSIS — G4733 Obstructive sleep apnea (adult) (pediatric): Secondary | ICD-10-CM | POA: Diagnosis not present

## 2015-03-08 DIAGNOSIS — R1310 Dysphagia, unspecified: Secondary | ICD-10-CM | POA: Diagnosis not present

## 2015-03-08 DIAGNOSIS — F419 Anxiety disorder, unspecified: Secondary | ICD-10-CM | POA: Diagnosis not present

## 2015-03-08 MED ORDER — GADOBENATE DIMEGLUMINE 529 MG/ML IV SOLN
17.0000 mL | Freq: Once | INTRAVENOUS | Status: AC | PRN
  Administered 2015-03-08: 17 mL via INTRAVENOUS

## 2015-03-09 ENCOUNTER — Telehealth: Payer: Self-pay | Admitting: Diagnostic Neuroimaging

## 2015-03-09 NOTE — Telephone Encounter (Signed)
Pt's daughter was wanting to change Carbidopa-Levodopa ER (RYTARY) 23.75-95 MG CPCR to [place] CR due to Rytary is too expensive.  Please call and advise.  She uses [employer].

## 2015-03-09 NOTE — Telephone Encounter (Signed)
Although we were able to get a prior auth approval for Rytary, patient would like Rx changed to [facility] [employer] [facility] ER because the brand name co-pay for Rytary is too expensive.  Please advise.  Thank you.

## 2015-03-10 NOTE — Telephone Encounter (Signed)
Is there co-pay assistance? pls contact our sales rep for help. Otherwise, ok to go back to previous carb/levo dosing. -VRP

## 2015-03-10 NOTE — Telephone Encounter (Signed)
Since patient has Medicare, unfortunately, she is not able to use the co-pay assist card for monthly refills.  Patient does have prescription plan that covers this drug, so she may not be eligible for assistance through the manufacturer, however I called back to provide number for PAP through [employer] [phone].  They will contact the manufacturer to determine if she meets eligibility requirements, and will call us back to advise.

## 2015-03-16 NOTE — Telephone Encounter (Signed)
Patient's daughter [name] @ [phone], stated PAP would be to expensive in the long run for the patient.  Requesting Rx SINEMET ER forwarded to [facility], which would be alternative medication.  Please call and advise.

## 2015-03-17 ENCOUNTER — Telehealth: Payer: Self-pay

## 2015-03-17 DIAGNOSIS — M1711 Unilateral primary osteoarthritis, right knee: Secondary | ICD-10-CM | POA: Diagnosis not present

## 2015-03-17 NOTE — Telephone Encounter (Signed)
Daughter has checked with the assistance program for Rytary, and indicates the patient will not qualify for coverage.  They would like to change back to Sinemet, however would like to know if patient can be changed from IR to ER.  Please advise.  Thank you.

## 2015-03-22 ENCOUNTER — Ambulatory Visit (INDEPENDENT_AMBULATORY_CARE_PROVIDER_SITE_OTHER): Payer: Medicare Other | Admitting: Diagnostic Neuroimaging

## 2015-03-22 ENCOUNTER — Encounter: Payer: Self-pay | Admitting: Diagnostic Neuroimaging

## 2015-03-22 VITALS — BP 130/78 | HR 70 | Ht 66.0 in | Wt 183.0 lb

## 2015-03-22 DIAGNOSIS — G20 Parkinson's disease: Secondary | ICD-10-CM | POA: Diagnosis not present

## 2015-03-22 MED ORDER — CARBIDOPA-LEVODOPA ER 50-200 MG PO TBCR: 1.0000 | EXTENDED_RELEASE_TABLET | Freq: Three times a day (TID) | ORAL | Status: DC

## 2015-03-22 NOTE — Telephone Encounter (Signed)
Ok will try carb/levo er 50/200 1 tab three times per day (before meals).   -VRP

## 2015-03-22 NOTE — Telephone Encounter (Signed)
I called back to advise.  Spoke with Daughter [name].  She is aware of the change.  Rx was sent to [facility].  I asked if they would like meds sent to local pharmacy while waiting on mail order.  She declined and said they will just use mail order.  They will call back if anything further is needed.

## 2015-03-22 NOTE — Progress Notes (Signed)
[facility]  PATIENT: [name] DOB: May 28, 1941  REFERRING CLINICIAN:  HISTORY FROM: patient and daughter REASON FOR VISIT: follow up   HISTORICAL  CHIEF COMPLAINT:  Chief Complaint  Patient presents with  . Follow-up    Parkinsons Disease     HISTORY OF PRESENT ILLNESS:   UPDATE 03/22/15: Since last visit, tried rytary with excellent results x 1 month; then cost was going to increase significantly. Now back to carb/levo 25/100 1.5/1.5/1.5/2 tabs. Better results over night, but not as good as with rytary. More right knee pain issues.  UPDATE 01/29/15: Since last visit patient having more problems with wearing off. She is taking her carbidopa/levodopa at 6 AM, noon, 4 PM and 7 PM. Patient feeling maximum wearing off symptoms mainly around 10 AM. She notes increasing tremor in arms, body, as well as internal tremulous feeling. Patient also having more problems with difficulty in urination, constipation, balance. Patient continues to live her on her own. Her daughter checks on her several times per week. Patient still able to do light housework. Patient not driving or shopping on her own. Daughter helps with these functions.  UPDATE 07/20/14: Since last visit, doing much better. Now exercising daily (30 minutes) on a abike and with weights / bands. Also incr carb/levo is working better. Some occ wearing off, but pt can usually sense this and make small adjustments with timing / freq and this helps manage symptoms.   UPDATE 01/20/14: Since last visit, tried amantadine, but it gave her flu-like symptoms, confusion, bad side effects. She took x 1 week then stopped. Few months a ago, also began to have intermittent visual hallucinations ("amish people, dogs, animals, children"), mainly at night. These are non-threatening, and she usually goes to sleep after seeing them. She usually can tell that they are not real. Anxiety is stable. Depression is still present. More balance  difficulties. More wearing off of carb/levo (usually after 4 hours). Still driving. Not using cane/walker. Is going to PT for exercises.  UPDATE 10/08/13: Since last visit, patient's husband passed away, and patient struggled with significant grieving and stress. She's finally come to terms and is doing better with mood and cognitive ability. Patient continues on carbidopa/levodopa, one and half tablets in the morning, one half tablets at noon, half tablet at 3 PM and 2 tablets in the evening. Patient is very attuned to her on off fluctuations and wearing off and is able to adjust her medications on daily basis to manage this. Her mood and anxiety have improved. She's taking Effexor 75 mg at bedtime and lorazepam 0.5 mg tabs, 2 tablets at bedtime. This controls her symptoms quite well. Patient's daughter has noted that tremor is slightly increasing over time. Patient also feels spasms in her abdominal region.  UPDATE 03/25/13: since last visit patient's tremor, balance and gait have worsened. Her memory is slightly worse. She's also becoming very obsessive compulsive. She has difficulty with going to the bathroom and may sit on the toilet for 1 or 2 hours at a time. She's become obsessed with the timing and scheduling going to the bathroom.  UPDATE 06/24/12: Now complaining of right leg pain (behind knee) severe pain since Sunday. Also intermittent since past 4-6 weeks. Started before starting exelon patch. Some cramps in her thighs. No back pain. Increased carb/levo is helping. Not tried clonazepam yet. Exelon patch seems to help her cognitive symptoms.   UPDATE 05/13/12: Cognitive problems, navigation abilities, perseveration and short term memory loss now  more problematic.Seems to be progressing at rapid pace per family. Poor sleep at night. More anxiety at night. Carb/levo working, but tends to wear off before next dose. Now takes 4 tabs per day (with meals + 1 qhs). Yesterday had visual hallucination (saw a  small boy outside near her husband).   UPDATE 01/10/12: Doing much better on carb/levo; tremor, gait, and mental abilities better.  No wearing off during day.  Sometimes wakes up in middle of night (2-3am) and has shaking, tremor, cannot fall asleep again.  Has snoring and daytime sleepiness.   PRIOR HPI (11/23/11): 74 year old right-handed female with history of diabetes, coronary artery disease, anxiety, care for hospital followup after possible transient ischemic attack. 10/31/11 - patient woke up, feeling confused, unable to organize and express her thoughts. Her daughter came to see her and was able to understand her, but the patient seemed frustrated and confused and unable to express what she was trying to say. At some point the patient developed some slurred speech and increased tremor. She was taken to the emergency room on 11/01/11 at 12:40am, and enrolled in the TIA protocol.  CT, MRI, carotid u/s and TTE done.  Labs done.  Patient discharged. For past 10 years, patient has had progressive postural tremor. Also has had intermittent episodes of internal sensation of restlessness and anxiety.  She has been treated with primidone since August 2012, without relief of symptoms. She is also on Effexor and Xanax for anxiety and depression.  Patient's daughter has noted progressive memory loss, confusional episodes over the last several years.  Balance and walking deteriorating.   REVIEW OF SYSTEMS: Full 14 system review of systems performed and notable only for memory loss tremors diff urinating incont bladder joint swelling walking diff hallucinations leg swelling.   ALLERGIES: Allergies  Allergen Reactions  . Aspirin     swelling    HOME MEDICATIONS: Outpatient Prescriptions Prior to Visit  Medication Sig Dispense Refill  . carbidopa-levodopa (SINEMET CR) 50-200 MG per tablet Take 1 tablet by mouth 3 (three) times daily. 90 tablet 6  . LORazepam (ATIVAN) 0.5 MG tablet Take 0.5 mg by mouth  every 8 (eight) hours. 1 bid    . metFORMIN (GLUCOPHAGE) 1000 MG tablet Take 1,000 mg by mouth 2 (two) times daily with a meal.      . nebivolol (BYSTOLIC) 5 MG tablet Take 5 mg by mouth daily.    . rivastigmine (EXELON) 4.6 mg/24hr Place 1 patch (4.6 mg total) onto the skin daily. 90 patch 1  . sertraline (ZOLOFT) 50 MG tablet Take 50 mg by mouth daily.     No facility-administered medications prior to visit.    PAST MEDICAL HISTORY: Past Medical History  Diagnosis Date  . Coronary artery disease     Mid LAD Stent 2001 Patent By cath on 08/10/2010  . Diabetes mellitus     Type II  . Dyslipidemia   . HTN (hypertension)   . Parkinson disease   . Anxiety   . GERD (gastroesophageal reflux disease)     PAST SURGICAL HISTORY: Past Surgical History  Procedure Laterality Date  . Cholecystectomy  1963  . Appendectomy    . Hysterectomy  1989  . Transthoracic echocardiogram  2012    EF 60-65%, mild AV regurg, LA mildly dilated, PA peak pressure 100mmHg  . Nm myocar perf wall motion  2012    lexiscan - normal perfusion, EF 71%, low risk  . Cardiac catheterization  07/02/2000    normal  LV function, 40-50% prox smooth LAD stenosis between 1st and 2nd diagonal, normal Cfx & RCA (Dr. Corky Downs)  . Coronary angioplasty with stent placement  10/31/2000    single vessel CAD (mild degree in mid-LAD 60% stenosis) - predilatation of plaque in mid LAD with cutting balloon angioplasty; 3.0x36mm Nir Elite stent to mid-LAD (Dr. Domenic Moras)  . Cardiac catheterization  02/20/2001    normal LV function, 50% stenosis prox to prox 3.5x85mm Nir Elite stent, 80% stenosis in a 1st septal perforating artery (Dr. Corky Downs)  . Cardiac catheterization  06/18/2006    patent LAD stent, dominant Cfx, low-normal EF (Dr. Domenic Moras)  . Cardiac catheterization  08/10/2010    patent mid LAD stent, nonobstructive CAD (Dr. Alma Friendly)    FAMILY HISTORY: Family History  Problem Relation Age of Onset  . Heart failure Father      SOCIAL HISTORY:  History   Social History  . Marital Status: Widowed    Spouse Name: N/A  . Number of Children: 3  . Years of Education: 12   Occupational History  . Retired    Social History Main Topics  . Smoking status: Never Smoker   . Smokeless tobacco: Never Used  . Alcohol Use: No  . Drug Use: No  . Sexual Activity: Not on file   Other Topics Concern  . Not on file   Social History Narrative   PT lives at home with her spouse.   Caffeine Use- 3 to 4 cups daily     PHYSICAL EXAM  Filed Vitals:   03/22/15 1300  BP: 130/78  Pulse: 70  Height: 5\' 6"  (1.676 m)  Weight: 183 lb (83.008 kg)   Body mass index is 29.55 kg/(m^2).  EXAM: General: Patient is awake, alert and in no acute distress.  Well developed and groomed. Cardiovascular: No carotid artery bruits.  Heart is regular rate and rhythm with no murmurs.  Neurologic Exam  Mental Status: Awake, alert. Language is fluent and comprehension intact. Cranial Nerves: Masked facial expression. Pupils are equal and reactive to light.  Visual fields are full to confrontation.  Conjugate eye movements are full, saccadic .  Ptosis on the left.  Facial sensation and strength are symmetric.  Hearing is intact to finger rub.  Palate elevated symmetrically and uvula is midline.  Shoulder shrug is symmetric.  Tongue is midline. Tremor at the soft pallate.  Motor: INTERMITTENT LUE > RUE MILD REST TREMOR. MILD POSTURAL TREMOR OF BUE.  MILD HEAD TREMOR.  MILD COGWHEELING WITH REINFORCEMENT.  MOD BRADYKINESIA IN LUE > RUE; RLE > LLE. Normal bulk. Full strength in the upper and lower extremities.  No pronator drift. Sensory: Intact and symmetric to light touch. Coordination: No ataxia or dysmetria on finger-nose or rapid alternating movement testing. Gait and Station: ABLE TO STAND, WALK, TURN, SMOOTHLY. LEFT HAND TREMOR; RIGHT HAND USING CANE  DIAGNOSTIC DATA (LABS, IMAGING, TESTING) - I reviewed patient records, labs,  notes, testing and imaging myself where available.  Lab Results  Component Value Date   WBC 9.2 11/01/2011   HGB 12.9 11/01/2011   HCT 38.3 11/01/2011   MCV 87.2 11/01/2011   PLT 312 11/01/2011      Component Value Date/Time   NA 138 11/01/2011 0055   K 4.1 11/01/2011 0055   CL 102 11/01/2011 0055   CO2 25 11/01/2011 0055   GLUCOSE 218* 11/01/2011 0055   BUN 16 11/01/2011 0055   CREATININE 0.89 11/01/2011 0055   CALCIUM  9.7 11/01/2011 0055   PROT 7.3 08/10/2010 0149   ALBUMIN 3.7 08/10/2010 0149   AST 23 08/10/2010 0149   ALT 14 08/10/2010 0149   ALKPHOS 91 08/10/2010 0149   BILITOT 0.5 08/10/2010 0149   GFRNONAA 63* 11/01/2011 0055   GFRAA 73* 11/01/2011 0055   Lab Results  Component Value Date   CHOL 208* 11/01/2011   HDL 54 11/01/2011   LDLCALC 122* 11/01/2011   TRIG 158* 11/01/2011   CHOLHDL 3.9 11/01/2011   Lab Results  Component Value Date   HGBA1C 6.8* 11/01/2011   No results found for: [id number] Lab Results  Component Value Date   TSH 3.446 08/10/2010    ASSESSMENT AND PLAN  74 y.o. year old female  has a past medical history of Coronary artery disease; Diabetes mellitus; Dyslipidemia; HTN (hypertension); Parkinson disease; Anxiety; and GERD (gastroesophageal reflux disease). here with Parkinson's disease. Did well with rytary but could not afford cost. Will try carb/levo ER generic.  PLAN: - stop carbidopa/levodopa imm release - start carbidopa/levodopa ER 50/200 1 tab TID - caution with balance, fall risk and living alone   Return in about 3 months (around 06/21/2015).    Penni Bombard, MD 8/85/0277, 4:12 PM Certified in Neurology, Neurophysiology and Neuroimaging  [facility] [address], [place], [place] [address] [phone]

## 2015-03-22 NOTE — Patient Instructions (Signed)
Sign up for mychart

## 2015-03-24 ENCOUNTER — Encounter: Payer: Self-pay | Admitting: Diagnostic Neuroimaging

## 2015-03-24 DIAGNOSIS — K641 Second degree hemorrhoids: Secondary | ICD-10-CM | POA: Diagnosis not present

## 2015-03-25 ENCOUNTER — Ambulatory Visit: Payer: Medicare Other | Admitting: Physical Therapy

## 2015-03-25 ENCOUNTER — Telehealth: Payer: Self-pay | Admitting: Diagnostic Neuroimaging

## 2015-03-25 NOTE — Telephone Encounter (Signed)
I called back.  Spoke with Velva Harman.  Clarified info.   Last OV note says:  Did well with rytary but could not afford cost. Will try carb/levo ER generic. PLAN: - stop carbidopa/levodopa imm release - start carbidopa/levodopa ER 50/200 1 tab TID

## 2015-03-25 NOTE — Telephone Encounter (Signed)
Reanna with [facility] is calling to get clarifications on medication Rytary 23.75-95. Please call and advise. Thank you.

## 2015-03-30 ENCOUNTER — Telehealth: Payer: Self-pay | Admitting: *Deleted

## 2015-03-30 ENCOUNTER — Telehealth: Payer: Self-pay | Admitting: Diagnostic Neuroimaging

## 2015-03-30 ENCOUNTER — Encounter: Payer: Self-pay | Admitting: Diagnostic Neuroimaging

## 2015-03-30 ENCOUNTER — Other Ambulatory Visit: Payer: Self-pay | Admitting: Diagnostic Neuroimaging

## 2015-03-30 DIAGNOSIS — R269 Unspecified abnormalities of gait and mobility: Secondary | ICD-10-CM | POA: Diagnosis not present

## 2015-03-30 DIAGNOSIS — F419 Anxiety disorder, unspecified: Secondary | ICD-10-CM | POA: Diagnosis not present

## 2015-03-30 DIAGNOSIS — I2510 Atherosclerotic heart disease of native coronary artery without angina pectoris: Secondary | ICD-10-CM | POA: Diagnosis not present

## 2015-03-30 DIAGNOSIS — G4733 Obstructive sleep apnea (adult) (pediatric): Secondary | ICD-10-CM | POA: Diagnosis not present

## 2015-03-30 DIAGNOSIS — G20 Parkinson's disease: Secondary | ICD-10-CM | POA: Diagnosis not present

## 2015-03-30 DIAGNOSIS — R1310 Dysphagia, unspecified: Secondary | ICD-10-CM | POA: Diagnosis not present

## 2015-03-30 NOTE — Telephone Encounter (Signed)
Catlain with [facility] is calling as she saw patient today. Patient expressed a need for PT per Dr. Gladstone Lighter suggestion.  Catlain is calling to get verbal order for PT and to request that a nurse stay on the case until PT is established. Please leave vebal order on confidential VM if Catlain does not answer.  Thanks!

## 2015-03-30 NOTE — Telephone Encounter (Signed)
Called and left a message for the PT Caitlin to have a verbal order for PT for this pt. Told her to call with any further questions

## 2015-03-31 ENCOUNTER — Encounter: Payer: Self-pay | Admitting: Diagnostic Neuroimaging

## 2015-03-31 ENCOUNTER — Other Ambulatory Visit: Payer: Self-pay | Admitting: *Deleted

## 2015-03-31 DIAGNOSIS — G20 Parkinson's disease: Secondary | ICD-10-CM

## 2015-03-31 MED ORDER — CARBIDOPA-LEVODOPA ER 23.75-95 MG PO CPCR: 3.0000 | ORAL_CAPSULE | Freq: Three times a day (TID) | ORAL | Status: DC

## 2015-04-01 NOTE — Telephone Encounter (Signed)
error 

## 2015-04-03 DIAGNOSIS — R413 Other amnesia: Secondary | ICD-10-CM | POA: Diagnosis not present

## 2015-04-03 DIAGNOSIS — G4733 Obstructive sleep apnea (adult) (pediatric): Secondary | ICD-10-CM | POA: Diagnosis not present

## 2015-04-03 DIAGNOSIS — G20 Parkinson's disease: Secondary | ICD-10-CM | POA: Diagnosis not present

## 2015-04-03 DIAGNOSIS — R1310 Dysphagia, unspecified: Secondary | ICD-10-CM | POA: Diagnosis not present

## 2015-04-03 DIAGNOSIS — F419 Anxiety disorder, unspecified: Secondary | ICD-10-CM | POA: Diagnosis not present

## 2015-04-03 DIAGNOSIS — I10 Essential (primary) hypertension: Secondary | ICD-10-CM | POA: Diagnosis not present

## 2015-04-03 DIAGNOSIS — K219 Gastro-esophageal reflux disease without esophagitis: Secondary | ICD-10-CM | POA: Diagnosis not present

## 2015-04-03 DIAGNOSIS — E119 Type 2 diabetes mellitus without complications: Secondary | ICD-10-CM | POA: Diagnosis not present

## 2015-04-03 DIAGNOSIS — I2510 Atherosclerotic heart disease of native coronary artery without angina pectoris: Secondary | ICD-10-CM | POA: Diagnosis not present

## 2015-04-05 ENCOUNTER — Encounter: Payer: Self-pay | Admitting: Cardiovascular Disease

## 2015-04-06 ENCOUNTER — Telehealth: Payer: Self-pay | Admitting: Cardiovascular Disease

## 2015-04-06 NOTE — Telephone Encounter (Signed)
Forward to [place]

## 2015-04-06 NOTE — Telephone Encounter (Signed)
She need prior authorization for Bystolic. Key#[id number] A fax was sent over 04-05-15. They are allowed to make 3 attempts to get this approvedl,they have already did 2.

## 2015-04-08 ENCOUNTER — Telehealth: Payer: Self-pay | Admitting: Diagnostic Neuroimaging

## 2015-04-08 DIAGNOSIS — I10 Essential (primary) hypertension: Secondary | ICD-10-CM | POA: Diagnosis not present

## 2015-04-08 DIAGNOSIS — R1310 Dysphagia, unspecified: Secondary | ICD-10-CM | POA: Diagnosis not present

## 2015-04-08 DIAGNOSIS — E119 Type 2 diabetes mellitus without complications: Secondary | ICD-10-CM | POA: Diagnosis not present

## 2015-04-08 DIAGNOSIS — F419 Anxiety disorder, unspecified: Secondary | ICD-10-CM | POA: Diagnosis not present

## 2015-04-08 DIAGNOSIS — I2510 Atherosclerotic heart disease of native coronary artery without angina pectoris: Secondary | ICD-10-CM | POA: Diagnosis not present

## 2015-04-08 DIAGNOSIS — G20 Parkinson's disease: Secondary | ICD-10-CM | POA: Diagnosis not present

## 2015-04-08 NOTE — Telephone Encounter (Signed)
Caitlyn from [facility] called wanting to let Dr. [name] that the Patient fell on Monday 4/25/116 coming out of the grocery store. She just wanted to bring this to Dr. Gladstone Lighter attention.  Patient in-home physical therapy is starting today 04/08/15. If you have any questions Caitlyn can be reached at [phone]

## 2015-04-09 NOTE — Telephone Encounter (Signed)
PA done today for patient's Bystolic.

## 2015-04-09 NOTE — Telephone Encounter (Signed)
Faxed PA to [facility] for Bystolic 5 mg.

## 2015-04-12 DIAGNOSIS — I10 Essential (primary) hypertension: Secondary | ICD-10-CM | POA: Diagnosis not present

## 2015-04-12 DIAGNOSIS — I2510 Atherosclerotic heart disease of native coronary artery without angina pectoris: Secondary | ICD-10-CM | POA: Diagnosis not present

## 2015-04-12 DIAGNOSIS — G20 Parkinson's disease: Secondary | ICD-10-CM | POA: Diagnosis not present

## 2015-04-12 DIAGNOSIS — E119 Type 2 diabetes mellitus without complications: Secondary | ICD-10-CM | POA: Diagnosis not present

## 2015-04-12 DIAGNOSIS — F419 Anxiety disorder, unspecified: Secondary | ICD-10-CM | POA: Diagnosis not present

## 2015-04-12 DIAGNOSIS — R1310 Dysphagia, unspecified: Secondary | ICD-10-CM | POA: Diagnosis not present

## 2015-04-13 ENCOUNTER — Other Ambulatory Visit: Payer: Self-pay | Admitting: *Deleted

## 2015-04-13 DIAGNOSIS — F419 Anxiety disorder, unspecified: Secondary | ICD-10-CM | POA: Diagnosis not present

## 2015-04-13 DIAGNOSIS — G20 Parkinson's disease: Secondary | ICD-10-CM | POA: Diagnosis not present

## 2015-04-13 DIAGNOSIS — I10 Essential (primary) hypertension: Secondary | ICD-10-CM | POA: Diagnosis not present

## 2015-04-13 DIAGNOSIS — R1310 Dysphagia, unspecified: Secondary | ICD-10-CM | POA: Diagnosis not present

## 2015-04-13 DIAGNOSIS — E119 Type 2 diabetes mellitus without complications: Secondary | ICD-10-CM | POA: Diagnosis not present

## 2015-04-13 DIAGNOSIS — I2510 Atherosclerotic heart disease of native coronary artery without angina pectoris: Secondary | ICD-10-CM | POA: Diagnosis not present

## 2015-04-13 MED ORDER — CARBIDOPA-LEVODOPA ER 23.75-95 MG PO CPCR: 3.0000 | ORAL_CAPSULE | Freq: Three times a day (TID) | ORAL | Status: DC

## 2015-04-14 DIAGNOSIS — K641 Second degree hemorrhoids: Secondary | ICD-10-CM | POA: Diagnosis not present

## 2015-04-15 DIAGNOSIS — F419 Anxiety disorder, unspecified: Secondary | ICD-10-CM | POA: Diagnosis not present

## 2015-04-15 DIAGNOSIS — E119 Type 2 diabetes mellitus without complications: Secondary | ICD-10-CM | POA: Diagnosis not present

## 2015-04-15 DIAGNOSIS — I2510 Atherosclerotic heart disease of native coronary artery without angina pectoris: Secondary | ICD-10-CM | POA: Diagnosis not present

## 2015-04-15 DIAGNOSIS — G20 Parkinson's disease: Secondary | ICD-10-CM | POA: Diagnosis not present

## 2015-04-15 DIAGNOSIS — R1310 Dysphagia, unspecified: Secondary | ICD-10-CM | POA: Diagnosis not present

## 2015-04-15 DIAGNOSIS — I10 Essential (primary) hypertension: Secondary | ICD-10-CM | POA: Diagnosis not present

## 2015-04-19 ENCOUNTER — Other Ambulatory Visit: Payer: Self-pay | Admitting: *Deleted

## 2015-04-19 ENCOUNTER — Encounter: Payer: Self-pay | Admitting: Diagnostic Neuroimaging

## 2015-04-19 DIAGNOSIS — G20 Parkinson's disease: Secondary | ICD-10-CM

## 2015-04-19 MED ORDER — CARBIDOPA-LEVODOPA ER 23.75-95 MG PO CPCR: 3.0000 | ORAL_CAPSULE | Freq: Three times a day (TID) | ORAL | Status: DC

## 2015-04-20 DIAGNOSIS — E119 Type 2 diabetes mellitus without complications: Secondary | ICD-10-CM | POA: Diagnosis not present

## 2015-04-20 DIAGNOSIS — I10 Essential (primary) hypertension: Secondary | ICD-10-CM | POA: Diagnosis not present

## 2015-04-20 DIAGNOSIS — F419 Anxiety disorder, unspecified: Secondary | ICD-10-CM | POA: Diagnosis not present

## 2015-04-20 DIAGNOSIS — G20 Parkinson's disease: Secondary | ICD-10-CM | POA: Diagnosis not present

## 2015-04-20 DIAGNOSIS — R1310 Dysphagia, unspecified: Secondary | ICD-10-CM | POA: Diagnosis not present

## 2015-04-20 DIAGNOSIS — I2510 Atherosclerotic heart disease of native coronary artery without angina pectoris: Secondary | ICD-10-CM | POA: Diagnosis not present

## 2015-04-22 DIAGNOSIS — F419 Anxiety disorder, unspecified: Secondary | ICD-10-CM | POA: Diagnosis not present

## 2015-04-22 DIAGNOSIS — G20 Parkinson's disease: Secondary | ICD-10-CM | POA: Diagnosis not present

## 2015-04-22 DIAGNOSIS — E119 Type 2 diabetes mellitus without complications: Secondary | ICD-10-CM | POA: Diagnosis not present

## 2015-04-22 DIAGNOSIS — R1310 Dysphagia, unspecified: Secondary | ICD-10-CM | POA: Diagnosis not present

## 2015-04-22 DIAGNOSIS — I2510 Atherosclerotic heart disease of native coronary artery without angina pectoris: Secondary | ICD-10-CM | POA: Diagnosis not present

## 2015-04-22 DIAGNOSIS — I10 Essential (primary) hypertension: Secondary | ICD-10-CM | POA: Diagnosis not present

## 2015-04-27 DIAGNOSIS — I2510 Atherosclerotic heart disease of native coronary artery without angina pectoris: Secondary | ICD-10-CM | POA: Diagnosis not present

## 2015-04-27 DIAGNOSIS — E119 Type 2 diabetes mellitus without complications: Secondary | ICD-10-CM | POA: Diagnosis not present

## 2015-04-27 DIAGNOSIS — F419 Anxiety disorder, unspecified: Secondary | ICD-10-CM | POA: Diagnosis not present

## 2015-04-27 DIAGNOSIS — R1310 Dysphagia, unspecified: Secondary | ICD-10-CM | POA: Diagnosis not present

## 2015-04-27 DIAGNOSIS — I10 Essential (primary) hypertension: Secondary | ICD-10-CM | POA: Diagnosis not present

## 2015-04-27 DIAGNOSIS — G20 Parkinson's disease: Secondary | ICD-10-CM | POA: Diagnosis not present

## 2015-04-29 DIAGNOSIS — I10 Essential (primary) hypertension: Secondary | ICD-10-CM | POA: Diagnosis not present

## 2015-04-29 DIAGNOSIS — F419 Anxiety disorder, unspecified: Secondary | ICD-10-CM | POA: Diagnosis not present

## 2015-04-29 DIAGNOSIS — G20 Parkinson's disease: Secondary | ICD-10-CM | POA: Diagnosis not present

## 2015-04-29 DIAGNOSIS — R1310 Dysphagia, unspecified: Secondary | ICD-10-CM | POA: Diagnosis not present

## 2015-04-29 DIAGNOSIS — E119 Type 2 diabetes mellitus without complications: Secondary | ICD-10-CM | POA: Diagnosis not present

## 2015-04-29 DIAGNOSIS — I2510 Atherosclerotic heart disease of native coronary artery without angina pectoris: Secondary | ICD-10-CM | POA: Diagnosis not present

## 2015-05-04 DIAGNOSIS — E119 Type 2 diabetes mellitus without complications: Secondary | ICD-10-CM | POA: Diagnosis not present

## 2015-05-04 DIAGNOSIS — F419 Anxiety disorder, unspecified: Secondary | ICD-10-CM | POA: Diagnosis not present

## 2015-05-04 DIAGNOSIS — I2510 Atherosclerotic heart disease of native coronary artery without angina pectoris: Secondary | ICD-10-CM | POA: Diagnosis not present

## 2015-05-04 DIAGNOSIS — G20 Parkinson's disease: Secondary | ICD-10-CM | POA: Diagnosis not present

## 2015-05-04 DIAGNOSIS — R1310 Dysphagia, unspecified: Secondary | ICD-10-CM | POA: Diagnosis not present

## 2015-05-04 DIAGNOSIS — I10 Essential (primary) hypertension: Secondary | ICD-10-CM | POA: Diagnosis not present

## 2015-05-05 DIAGNOSIS — H0012 Chalazion right lower eyelid: Secondary | ICD-10-CM | POA: Diagnosis not present

## 2015-05-06 DIAGNOSIS — I10 Essential (primary) hypertension: Secondary | ICD-10-CM | POA: Diagnosis not present

## 2015-05-06 DIAGNOSIS — G20 Parkinson's disease: Secondary | ICD-10-CM | POA: Diagnosis not present

## 2015-05-06 DIAGNOSIS — E119 Type 2 diabetes mellitus without complications: Secondary | ICD-10-CM | POA: Diagnosis not present

## 2015-05-06 DIAGNOSIS — M1711 Unilateral primary osteoarthritis, right knee: Secondary | ICD-10-CM | POA: Diagnosis not present

## 2015-05-06 DIAGNOSIS — F419 Anxiety disorder, unspecified: Secondary | ICD-10-CM | POA: Diagnosis not present

## 2015-05-06 DIAGNOSIS — I2510 Atherosclerotic heart disease of native coronary artery without angina pectoris: Secondary | ICD-10-CM | POA: Diagnosis not present

## 2015-05-06 DIAGNOSIS — R1310 Dysphagia, unspecified: Secondary | ICD-10-CM | POA: Diagnosis not present

## 2015-05-07 DIAGNOSIS — G20 Parkinson's disease: Secondary | ICD-10-CM | POA: Diagnosis not present

## 2015-05-07 DIAGNOSIS — R1310 Dysphagia, unspecified: Secondary | ICD-10-CM | POA: Diagnosis not present

## 2015-05-07 DIAGNOSIS — F419 Anxiety disorder, unspecified: Secondary | ICD-10-CM | POA: Diagnosis not present

## 2015-05-07 DIAGNOSIS — E119 Type 2 diabetes mellitus without complications: Secondary | ICD-10-CM | POA: Diagnosis not present

## 2015-05-07 DIAGNOSIS — I10 Essential (primary) hypertension: Secondary | ICD-10-CM | POA: Diagnosis not present

## 2015-05-07 DIAGNOSIS — I2510 Atherosclerotic heart disease of native coronary artery without angina pectoris: Secondary | ICD-10-CM | POA: Diagnosis not present

## 2015-05-11 DIAGNOSIS — E119 Type 2 diabetes mellitus without complications: Secondary | ICD-10-CM | POA: Diagnosis not present

## 2015-05-11 DIAGNOSIS — H0012 Chalazion right lower eyelid: Secondary | ICD-10-CM | POA: Diagnosis not present

## 2015-05-11 DIAGNOSIS — R1310 Dysphagia, unspecified: Secondary | ICD-10-CM | POA: Diagnosis not present

## 2015-05-11 DIAGNOSIS — I2510 Atherosclerotic heart disease of native coronary artery without angina pectoris: Secondary | ICD-10-CM | POA: Diagnosis not present

## 2015-05-11 DIAGNOSIS — I10 Essential (primary) hypertension: Secondary | ICD-10-CM | POA: Diagnosis not present

## 2015-05-11 DIAGNOSIS — G20 Parkinson's disease: Secondary | ICD-10-CM | POA: Diagnosis not present

## 2015-05-11 DIAGNOSIS — F419 Anxiety disorder, unspecified: Secondary | ICD-10-CM | POA: Diagnosis not present

## 2015-05-13 DIAGNOSIS — I10 Essential (primary) hypertension: Secondary | ICD-10-CM | POA: Diagnosis not present

## 2015-05-13 DIAGNOSIS — R1310 Dysphagia, unspecified: Secondary | ICD-10-CM | POA: Diagnosis not present

## 2015-05-13 DIAGNOSIS — I2510 Atherosclerotic heart disease of native coronary artery without angina pectoris: Secondary | ICD-10-CM | POA: Diagnosis not present

## 2015-05-13 DIAGNOSIS — G20 Parkinson's disease: Secondary | ICD-10-CM | POA: Diagnosis not present

## 2015-05-13 DIAGNOSIS — E119 Type 2 diabetes mellitus without complications: Secondary | ICD-10-CM | POA: Diagnosis not present

## 2015-05-13 DIAGNOSIS — F419 Anxiety disorder, unspecified: Secondary | ICD-10-CM | POA: Diagnosis not present

## 2015-06-03 ENCOUNTER — Telehealth: Payer: Self-pay | Admitting: *Deleted

## 2015-06-03 ENCOUNTER — Encounter: Payer: Self-pay | Admitting: Diagnostic Neuroimaging

## 2015-06-03 MED ORDER — TIZANIDINE HCL 4 MG PO TABS: 4.0000 mg | ORAL_TABLET | Freq: Three times a day (TID) | ORAL | Status: DC | PRN

## 2015-06-03 NOTE — Telephone Encounter (Signed)
May try muscle relaxer tizanidine for a week to see if pain improves. Caution with sedation and confusion, which can happen with any muscle relaxer.  -VRP

## 2015-06-03 NOTE — Telephone Encounter (Signed)
Left detailed vm informing daughter, [name] that Dr [name] has e scribed muscle relaxer for her mother to try for a week. Informed her of cautions and requested she call back in several days to update this RN on her mother. Left this caller's name, number for any questions.

## 2015-06-03 NOTE — Telephone Encounter (Signed)
Spoke with daughter, [name] who states her mother has been having neck/back muscle spasms x 2 weeks. She states her mother has tried Ice/heat, daughter has massaged, and patient has taken Ibuprofen but with little relief. [name] is asking if a muscle relaxer would be helpful. Patient has FU on 06/21/15, but daughter states her mother needs relief before that date. Verified her pharmacy of choice as [facility] on [address] for new prescription. Daughter states she doesn't want 3 month supply in case it isn't effective. Informed daughter that Dr [name] is out of office until this afternoon, is in office tomorrow 8-12 noon.  Informed her she would receive call when Dr [name] replied to her request. She verbalized understanding, appreciation.

## 2015-06-07 ENCOUNTER — Other Ambulatory Visit: Payer: Self-pay

## 2015-06-21 ENCOUNTER — Ambulatory Visit (INDEPENDENT_AMBULATORY_CARE_PROVIDER_SITE_OTHER): Payer: Medicare Other | Admitting: Diagnostic Neuroimaging

## 2015-06-21 ENCOUNTER — Encounter: Payer: Self-pay | Admitting: Diagnostic Neuroimaging

## 2015-06-21 VITALS — BP 113/64 | HR 70 | Ht 66.0 in | Wt 180.4 lb

## 2015-06-21 DIAGNOSIS — G20 Parkinson's disease: Secondary | ICD-10-CM | POA: Diagnosis not present

## 2015-06-21 MED ORDER — CARBIDOPA-LEVODOPA ER 23.75-95 MG PO CPCR: 3.0000 | ORAL_CAPSULE | Freq: Three times a day (TID) | ORAL | Status: DC

## 2015-06-21 NOTE — Progress Notes (Signed)
[facility]  PATIENT: [name] DOB: 01-Jun-1941  REFERRING CLINICIAN:  HISTORY FROM: patient and daughter [name]) REASON FOR VISIT: follow up   HISTORICAL  CHIEF COMPLAINT:  Chief Complaint  Patient presents with  . Parkinson's, rm 7, daughter, [name]    HISTORY OF PRESENT ILLNESS:   UPDATE 06/21/15: Pt reporting more fam hx of tremor (sister dx'd with PD recently; sister's son and his daughter dx'd with ET; 2 first cousins with ALS; patient's father had ET and PD). Doing well on rytary. Better tremor control. Better sleep. Pt and daughter planning to change living arrangement. Pt planning to sell her home and move in with daughter (after home renovations). Using cane at home. Had muscle spasms, better with heat pad and tizanidine. Now off muscle relaxer.   UPDATE 03/22/15: Since last visit, tried rytary with excellent results x 1 month; then cost was going to increase significantly. Now back to carb/levo 25/100 1.5/1.5/1.5/2 tabs. Better results over night, but not as good as with rytary. More right knee pain issues.  UPDATE 01/29/15: Since last visit patient having more problems with wearing off. She is taking her carbidopa/levodopa at 6 AM, noon, 4 PM and 7 PM. Patient feeling maximum wearing off symptoms mainly around 10 AM. She notes increasing tremor in arms, body, as well as internal tremulous feeling. Patient also having more problems with difficulty in urination, constipation, balance. Patient continues to live her on her own. Her daughter checks on her several times per week. Patient still able to do light housework. Patient not driving or shopping on her own. Daughter helps with these functions.  UPDATE 07/20/14: Since last visit, doing much better. Now exercising daily (30 minutes) on a abike and with weights / bands. Also incr carb/levo is working better. Some occ wearing off, but pt can usually sense this and make small adjustments with timing / freq and  this helps manage symptoms.   UPDATE 01/20/14: Since last visit, tried amantadine, but it gave her flu-like symptoms, confusion, bad side effects. She took x 1 week then stopped. Few months a ago, also began to have intermittent visual hallucinations ("amish people, dogs, animals, children"), mainly at night. These are non-threatening, and she usually goes to sleep after seeing them. She usually can tell that they are not real. Anxiety is stable. Depression is still present. More balance difficulties. More wearing off of carb/levo (usually after 4 hours). Still driving. Not using cane/walker. Is going to PT for exercises.  UPDATE 10/08/13: Since last visit, patient's husband passed away, and patient struggled with significant grieving and stress. She's finally come to terms and is doing better with mood and cognitive ability. Patient continues on carbidopa/levodopa, one and half tablets in the morning, one half tablets at noon, half tablet at 3 PM and 2 tablets in the evening. Patient is very attuned to her on off fluctuations and wearing off and is able to adjust her medications on daily basis to manage this. Her mood and anxiety have improved. She's taking Effexor 75 mg at bedtime and lorazepam 0.5 mg tabs, 2 tablets at bedtime. This controls her symptoms quite well. Patient's daughter has noted that tremor is slightly increasing over time. Patient also feels spasms in her abdominal region.  UPDATE 03/25/13: since last visit patient's tremor, balance and gait have worsened. Her memory is slightly worse. She's also becoming very obsessive compulsive. She has difficulty with going to the bathroom and may sit on the toilet for 1 or  2 hours at a time. She's become obsessed with the timing and scheduling going to the bathroom.  UPDATE 06/24/12: Now complaining of right leg pain (behind knee) severe pain since Sunday. Also intermittent since past 4-6 weeks. Started before starting exelon patch. Some cramps in her  thighs. No back pain. Increased carb/levo is helping. Not tried clonazepam yet. Exelon patch seems to help her cognitive symptoms.   UPDATE 05/13/12: Cognitive problems, navigation abilities, perseveration and short term memory loss now more problematic.Seems to be progressing at rapid pace per family. Poor sleep at night. More anxiety at night. Carb/levo working, but tends to wear off before next dose. Now takes 4 tabs per day (with meals + 1 qhs). Yesterday had visual hallucination (saw a small boy outside near her husband).   UPDATE 01/10/12: Doing much better on carb/levo; tremor, gait, and mental abilities better.  No wearing off during day.  Sometimes wakes up in middle of night (2-3am) and has shaking, tremor, cannot fall asleep again.  Has snoring and daytime sleepiness.   PRIOR HPI (11/23/11): 74 year old right-handed female with history of diabetes, coronary artery disease, anxiety, care for hospital followup after possible transient ischemic attack. 10/31/11 - patient woke up, feeling confused, unable to organize and express her thoughts. Her daughter came to see her and was able to understand her, but the patient seemed frustrated and confused and unable to express what she was trying to say. At some point the patient developed some slurred speech and increased tremor. She was taken to the emergency room on 11/01/11 at 12:40am, and enrolled in the TIA protocol.  CT, MRI, carotid u/s and TTE done.  Labs done.  Patient discharged. For past 10 years, patient has had progressive postural tremor. Also has had intermittent episodes of internal sensation of restlessness and anxiety.  She has been treated with primidone since August 2012, without relief of symptoms. She is also on Effexor and Xanax for anxiety and depression.  Patient's daughter has noted progressive memory loss, confusional episodes over the last several years.  Balance and walking deteriorating.   REVIEW OF SYSTEMS: Full 14 system review  of systems performed and notable only for memory loss tremors diff urinating incont bladder joint swelling walking diff hallucinations leg swelling trouble swallowing speech diff neck and joint pain.   ALLERGIES: Allergies  Allergen Reactions  . Aspirin     swelling    HOME MEDICATIONS: Outpatient Prescriptions Prior to Visit  Medication Sig Dispense Refill  . Carbidopa-Levodopa ER (RYTARY) 23.75-95 MG CPCR Take 3 capsules by mouth 3 (three) times daily. 270 capsule 3  . metFORMIN (GLUCOPHAGE) 1000 MG tablet Take 1,000 mg by mouth 2 (two) times daily with a meal.      . rivastigmine (EXELON) 4.6 mg/24hr Place 1 patch (4.6 mg total) onto the skin daily. 90 patch 1  . sertraline (ZOLOFT) 50 MG tablet Take 50 mg by mouth daily.    Marland Kitchen tiZANidine (ZANAFLEX) 4 MG tablet Take 1 tablet (4 mg total) by mouth every 8 (eight) hours as needed for muscle spasms. 30 tablet 3  . LORazepam (ATIVAN) 0.5 MG tablet Take 0.5 mg by mouth every 8 (eight) hours. 1 bid    . nebivolol (BYSTOLIC) 5 MG tablet Take 5 mg by mouth daily.     No facility-administered medications prior to visit.    PAST MEDICAL HISTORY: Past Medical History  Diagnosis Date  . Coronary artery disease     Mid LAD Stent 2001 Patent By cath  on 08/10/2010  . Diabetes mellitus     Type II  . Dyslipidemia   . HTN (hypertension)   . Parkinson disease   . Anxiety   . GERD (gastroesophageal reflux disease)     PAST SURGICAL HISTORY: Past Surgical History  Procedure Laterality Date  . Cholecystectomy  1963  . Appendectomy    . Hysterectomy  1989  . Transthoracic echocardiogram  2012    EF 60-65%, mild AV regurg, LA mildly dilated, PA peak pressure 52mmHg  . Nm myocar perf wall motion  2012    lexiscan - normal perfusion, EF 71%, low risk  . Cardiac catheterization  07/02/2000    normal LV function, 40-50% prox smooth LAD stenosis between 1st and 2nd diagonal, normal Cfx & RCA (Dr. Corky Downs)  . Coronary angioplasty with stent  placement  10/31/2000    single vessel CAD (mild degree in mid-LAD 60% stenosis) - predilatation of plaque in mid LAD with cutting balloon angioplasty; 3.0x29mm Nir Elite stent to mid-LAD (Dr. Domenic Moras)  . Cardiac catheterization  02/20/2001    normal LV function, 50% stenosis prox to prox 3.5x42mm Nir Elite stent, 80% stenosis in a 1st septal perforating artery (Dr. Corky Downs)  . Cardiac catheterization  06/18/2006    patent LAD stent, dominant Cfx, low-normal EF (Dr. Domenic Moras)  . Cardiac catheterization  08/10/2010    patent mid LAD stent, nonobstructive CAD (Dr. Alma Friendly)    FAMILY HISTORY: Family History  Problem Relation Age of Onset  . Heart failure Father     SOCIAL HISTORY:  History   Social History  . Marital Status: Widowed    Spouse Name: N/A  . Number of Children: 3  . Years of Education: 12   Occupational History  . Retired    Social History Main Topics  . Smoking status: Never Smoker   . Smokeless tobacco: Never Used  . Alcohol Use: No  . Drug Use: No  . Sexual Activity: Not on file   Other Topics Concern  . Not on file   Social History Narrative   PT lives at home with her spouse.   Caffeine Use- 3 to 4 cups daily     PHYSICAL EXAM  Filed Vitals:   06/21/15 1036  BP: 113/64  Pulse: 70  Height: 5\' 6"  (1.676 m)  Weight: 180 lb 6.4 oz (81.829 kg)   Body mass index is 29.13 kg/(m^2).  EXAM: General: Patient is awake, alert and in no acute distress.  Well developed and groomed. Cardiovascular: No carotid artery bruits.  Heart is regular rate and rhythm with no murmurs.  Neurologic Exam  Mental Status: Awake, alert. Language is fluent and comprehension intact. Cranial Nerves: Masked facial expression. Pupils are equal and reactive to light.  Visual fields are full to confrontation.  Conjugate eye movements are full, saccadic .  Ptosis on the left.  Facial sensation and strength are symmetric.  Hearing is intact to finger rub.  Palate elevated  symmetrically and uvula is midline.  Shoulder shrug is symmetric.  Tongue is midline. Tremor at the soft pallate.  Motor: INTERMITTENT LUE > RUE MILD REST TREMOR. MILD POSTURAL TREMOR OF BUE.  MILD HEAD TREMOR.  MILD COGWHEELING WITH REINFORCEMENT.  MOD BRADYKINESIA IN LUE > RUE; RLE > LLE. Normal bulk. Full strength in the upper and lower extremities.  No pronator drift. Sensory: Intact and symmetric to light touch. Coordination: No ataxia or dysmetria on finger-nose or rapid alternating movement testing. Gait and  Station: ABLE TO STAND, WALK, TURN, SMOOTHLY. LEFT HAND TREMOR; RIGHT HAND USING CANE   DIAGNOSTIC DATA (LABS, IMAGING, TESTING) - I reviewed patient records, labs, notes, testing and imaging myself where available.  Lab Results  Component Value Date   WBC 9.2 11/01/2011   HGB 12.9 11/01/2011   HCT 38.3 11/01/2011   MCV 87.2 11/01/2011   PLT 312 11/01/2011      Component Value Date/Time   NA 138 11/01/2011 0055   K 4.1 11/01/2011 0055   CL 102 11/01/2011 0055   CO2 25 11/01/2011 0055   GLUCOSE 218* 11/01/2011 0055   BUN 16 11/01/2011 0055   CREATININE 0.89 11/01/2011 0055   CALCIUM 9.7 11/01/2011 0055   PROT 7.3 08/10/2010 0149   ALBUMIN 3.7 08/10/2010 0149   AST 23 08/10/2010 0149   ALT 14 08/10/2010 0149   ALKPHOS 91 08/10/2010 0149   BILITOT 0.5 08/10/2010 0149   GFRNONAA 63* 11/01/2011 0055   GFRAA 73* 11/01/2011 0055   Lab Results  Component Value Date   CHOL 208* 11/01/2011   HDL 54 11/01/2011   LDLCALC 122* 11/01/2011   TRIG 158* 11/01/2011   CHOLHDL 3.9 11/01/2011   Lab Results  Component Value Date   HGBA1C 6.8* 11/01/2011   No results found for: [id number] Lab Results  Component Value Date   TSH 3.446 08/10/2010    ASSESSMENT AND PLAN  74 y.o. year old female  has a past medical history of Coronary artery disease; Diabetes mellitus; Dyslipidemia; HTN (hypertension); Parkinson disease; Anxiety; and GERD (gastroesophageal reflux disease).  here with Parkinson's disease.   Doing much better with rytary with tremor control. Still with balance issues and fall risk.  Also reports strong fam hx of essential tremor, PD, and ALS. Could represent a genetic/familial form of PD for patient.   PLAN: I spent 25 minutes of face to face time with patient. Greater than 50% of time was spent in counseling and coordination of care with patient. In summary we discussed:  - continue rytary - caution with balance, fall risk --> use walker - caution with living alone; agree with plan per daughter and pt to live together   Meds ordered this encounter  Medications  . Carbidopa-Levodopa ER (RYTARY) 23.75-95 MG CPCR    Sig: Take 3 capsules by mouth 3 (three) times daily.    Dispense:  270 capsule    Refill:  12   Return in about 3 months (around 09/21/2015).    Penni Bombard, MD 3/00/5110, 21:11 AM Certified in Neurology, Neurophysiology and Neuroimaging  [facility] [address], [place], [place] [address] [phone]

## 2015-06-22 DIAGNOSIS — R8299 Other abnormal findings in urine: Secondary | ICD-10-CM | POA: Diagnosis not present

## 2015-06-22 DIAGNOSIS — I10 Essential (primary) hypertension: Secondary | ICD-10-CM | POA: Diagnosis not present

## 2015-06-22 DIAGNOSIS — E559 Vitamin D deficiency, unspecified: Secondary | ICD-10-CM | POA: Diagnosis not present

## 2015-06-22 DIAGNOSIS — E1140 Type 2 diabetes mellitus with diabetic neuropathy, unspecified: Secondary | ICD-10-CM | POA: Diagnosis not present

## 2015-06-22 DIAGNOSIS — E785 Hyperlipidemia, unspecified: Secondary | ICD-10-CM | POA: Diagnosis not present

## 2015-06-22 DIAGNOSIS — N390 Urinary tract infection, site not specified: Secondary | ICD-10-CM | POA: Diagnosis not present

## 2015-06-23 ENCOUNTER — Other Ambulatory Visit: Payer: Self-pay

## 2015-06-23 DIAGNOSIS — G20 Parkinson's disease: Secondary | ICD-10-CM

## 2015-06-23 MED ORDER — CARBIDOPA-LEVODOPA ER 23.75-95 MG PO CPCR: 3.0000 | ORAL_CAPSULE | Freq: Three times a day (TID) | ORAL | Status: DC

## 2015-06-25 DIAGNOSIS — R609 Edema, unspecified: Secondary | ICD-10-CM | POA: Diagnosis not present

## 2015-06-25 DIAGNOSIS — Z9861 Coronary angioplasty status: Secondary | ICD-10-CM | POA: Diagnosis not present

## 2015-06-25 DIAGNOSIS — G3183 Dementia with Lewy bodies: Secondary | ICD-10-CM | POA: Diagnosis not present

## 2015-06-25 DIAGNOSIS — F329 Major depressive disorder, single episode, unspecified: Secondary | ICD-10-CM | POA: Diagnosis not present

## 2015-06-25 DIAGNOSIS — E559 Vitamin D deficiency, unspecified: Secondary | ICD-10-CM | POA: Diagnosis not present

## 2015-06-25 DIAGNOSIS — E785 Hyperlipidemia, unspecified: Secondary | ICD-10-CM | POA: Diagnosis not present

## 2015-06-25 DIAGNOSIS — Z6829 Body mass index (BMI) 29.0-29.9, adult: Secondary | ICD-10-CM | POA: Diagnosis not present

## 2015-06-25 DIAGNOSIS — E1140 Type 2 diabetes mellitus with diabetic neuropathy, unspecified: Secondary | ICD-10-CM | POA: Diagnosis not present

## 2015-06-25 DIAGNOSIS — Z0000 Encounter for general adult medical examination without abnormal findings: Secondary | ICD-10-CM | POA: Diagnosis not present

## 2015-06-25 DIAGNOSIS — I10 Essential (primary) hypertension: Secondary | ICD-10-CM | POA: Diagnosis not present

## 2015-06-25 DIAGNOSIS — Z1389 Encounter for screening for other disorder: Secondary | ICD-10-CM | POA: Diagnosis not present

## 2015-06-25 DIAGNOSIS — G20 Parkinson's disease: Secondary | ICD-10-CM | POA: Diagnosis not present

## 2015-06-28 DIAGNOSIS — Z1212 Encounter for screening for malignant neoplasm of rectum: Secondary | ICD-10-CM | POA: Diagnosis not present

## 2015-06-28 LAB — IFOBT (OCCULT BLOOD): IMMUNOLOGICAL FECAL OCCULT BLOOD TEST: NEGATIVE

## 2015-06-29 DIAGNOSIS — M1711 Unilateral primary osteoarthritis, right knee: Secondary | ICD-10-CM | POA: Diagnosis not present

## 2015-09-20 ENCOUNTER — Ambulatory Visit (INDEPENDENT_AMBULATORY_CARE_PROVIDER_SITE_OTHER): Payer: Medicare Other | Admitting: Diagnostic Neuroimaging

## 2015-09-20 ENCOUNTER — Encounter: Payer: Self-pay | Admitting: Diagnostic Neuroimaging

## 2015-09-20 VITALS — BP 112/67 | HR 80 | Ht 66.0 in | Wt 182.0 lb

## 2015-09-20 DIAGNOSIS — G20 Parkinson's disease: Secondary | ICD-10-CM

## 2015-09-20 MED ORDER — CARBIDOPA-LEVODOPA ER 23.75-95 MG PO CPCR: 4.0000 | ORAL_CAPSULE | Freq: Three times a day (TID) | ORAL | Status: DC

## 2015-09-20 NOTE — Progress Notes (Signed)
[facility]  PATIENT: [name] DOB: 06/24/1941  REFERRING CLINICIAN:  HISTORY FROM: patient and daughter [name]) REASON FOR VISIT: follow up   HISTORICAL  CHIEF COMPLAINT:  Chief Complaint  Patient presents with  . Parkinson Disease    rm 7    HISTORY OF PRESENT ILLNESS:   UPDATE 09/20/15: Since last visit, more wearing off. Also having 1 week every 6 weeks of fatigue, malaise, tired feeling. Also noted a red scalp issue, new, and going to dermatology.   UPDATE 06/21/15: Pt reporting more fam hx of tremor (sister dx'd with PD recently; sister's son and his daughter dx'd with ET; 2 first cousins with ALS; patient's father had ET and PD). Doing well on rytary. Better tremor control. Better sleep. Pt and daughter planning to change living arrangement. Pt planning to sell her home and move in with daughter (after home renovations). Using cane at home. Had muscle spasms, better with heat pad and tizanidine. Now off muscle relaxer.   UPDATE 03/22/15: Since last visit, tried rytary with excellent results x 1 month; then cost was going to increase significantly. Now back to carb/levo 25/100 1.5/1.5/1.5/2 tabs. Better results over night, but not as good as with rytary. More right knee pain issues.  UPDATE 01/29/15: Since last visit patient having more problems with wearing off. She is taking her carbidopa/levodopa at 6 AM, noon, 4 PM and 7 PM. Patient feeling maximum wearing off symptoms mainly around 10 AM. She notes increasing tremor in arms, body, as well as internal tremulous feeling. Patient also having more problems with difficulty in urination, constipation, balance. Patient continues to live her on her own. Her daughter checks on her several times per week. Patient still able to do light housework. Patient not driving or shopping on her own. Daughter helps with these functions.  UPDATE 07/20/14: Since last visit, doing much better. Now exercising daily (30 minutes)  on a abike and with weights / bands. Also incr carb/levo is working better. Some occ wearing off, but pt can usually sense this and make small adjustments with timing / freq and this helps manage symptoms.   UPDATE 01/20/14: Since last visit, tried amantadine, but it gave her flu-like symptoms, confusion, bad side effects. She took x 1 week then stopped. Few months a ago, also began to have intermittent visual hallucinations ("amish people, dogs, animals, children"), mainly at night. These are non-threatening, and she usually goes to sleep after seeing them. She usually can tell that they are not real. Anxiety is stable. Depression is still present. More balance difficulties. More wearing off of carb/levo (usually after 4 hours). Still driving. Not using cane/walker. Is going to PT for exercises.  UPDATE 10/08/13: Since last visit, patient's husband passed away, and patient struggled with significant grieving and stress. She's finally come to terms and is doing better with mood and cognitive ability. Patient continues on carbidopa/levodopa, one and half tablets in the morning, one half tablets at noon, half tablet at 3 PM and 2 tablets in the evening. Patient is very attuned to her on off fluctuations and wearing off and is able to adjust her medications on daily basis to manage this. Her mood and anxiety have improved. She's taking Effexor 75 mg at bedtime and lorazepam 0.5 mg tabs, 2 tablets at bedtime. This controls her symptoms quite well. Patient's daughter has noted that tremor is slightly increasing over time. Patient also feels spasms in her abdominal region.  UPDATE 03/25/13: since last visit  patient's tremor, balance and gait have worsened. Her memory is slightly worse. She's also becoming very obsessive compulsive. She has difficulty with going to the bathroom and may sit on the toilet for 1 or 2 hours at a time. She's become obsessed with the timing and scheduling going to the bathroom.  UPDATE  06/24/12: Now complaining of right leg pain (behind knee) severe pain since Sunday. Also intermittent since past 4-6 weeks. Started before starting exelon patch. Some cramps in her thighs. No back pain. Increased carb/levo is helping. Not tried clonazepam yet. Exelon patch seems to help her cognitive symptoms.   UPDATE 05/13/12: Cognitive problems, navigation abilities, perseveration and short term memory loss now more problematic.Seems to be progressing at rapid pace per family. Poor sleep at night. More anxiety at night. Carb/levo working, but tends to wear off before next dose. Now takes 4 tabs per day (with meals + 1 qhs). Yesterday had visual hallucination (saw a small boy outside near her husband).   UPDATE 01/10/12: Doing much better on carb/levo; tremor, gait, and mental abilities better.  No wearing off during day.  Sometimes wakes up in middle of night (2-3am) and has shaking, tremor, cannot fall asleep again.  Has snoring and daytime sleepiness.   PRIOR HPI (11/23/11): 74 year old right-handed female with history of diabetes, coronary artery disease, anxiety, care for hospital followup after possible transient ischemic attack. 10/31/11 - patient woke up, feeling confused, unable to organize and express her thoughts. Her daughter came to see her and was able to understand her, but the patient seemed frustrated and confused and unable to express what she was trying to say. At some point the patient developed some slurred speech and increased tremor. She was taken to the emergency room on 11/01/11 at 12:40am, and enrolled in the TIA protocol.  CT, MRI, carotid u/s and TTE done.  Labs done.  Patient discharged. For past 10 years, patient has had progressive postural tremor. Also has had intermittent episodes of internal sensation of restlessness and anxiety.  She has been treated with primidone since August 2012, without relief of symptoms. She is also on Effexor and Xanax for anxiety and depression.   Patient's daughter has noted progressive memory loss, confusional episodes over the last several years.  Balance and walking deteriorating.   REVIEW OF SYSTEMS: Full 14 system review of systems performed and notable only as per HPI.   ALLERGIES: Allergies  Allergen Reactions  . Aspirin     swelling    HOME MEDICATIONS: Outpatient Prescriptions Prior to Visit  Medication Sig Dispense Refill  . Carbidopa-Levodopa ER (RYTARY) 23.75-95 MG CPCR Take 3 capsules by mouth 3 (three) times daily. 810 capsule 4  . LORazepam (ATIVAN) 0.5 MG tablet Take 0.5 mg by mouth every 8 (eight) hours. 1 bid    . metFORMIN (GLUCOPHAGE) 1000 MG tablet Take 1,000 mg by mouth 2 (two) times daily with a meal.      . rivastigmine (EXELON) 4.6 mg/24hr Place 1 patch (4.6 mg total) onto the skin daily. 90 patch 1  . sertraline (ZOLOFT) 50 MG tablet Take 50 mg by mouth daily.    Marland Kitchen tiZANidine (ZANAFLEX) 4 MG tablet Take 1 tablet (4 mg total) by mouth every 8 (eight) hours as needed for muscle spasms. 30 tablet 3  . nebivolol (BYSTOLIC) 5 MG tablet Take 5 mg by mouth daily.     No facility-administered medications prior to visit.    PAST MEDICAL HISTORY: Past Medical History  Diagnosis Date  .  Coronary artery disease     Mid LAD Stent 2001 Patent By cath on 08/10/2010  . Diabetes mellitus     Type II  . Dyslipidemia   . HTN (hypertension)   . Parkinson disease ([place])   . Anxiety   . GERD (gastroesophageal reflux disease)     PAST SURGICAL HISTORY: Past Surgical History  Procedure Laterality Date  . Cholecystectomy  1963  . Appendectomy    . Hysterectomy  1989  . Transthoracic echocardiogram  2012    EF 60-65%, mild AV regurg, LA mildly dilated, PA peak pressure 53mmHg  . Nm myocar perf wall motion  2012    lexiscan - normal perfusion, EF 71%, low risk  . Cardiac catheterization  07/02/2000    normal LV function, 40-50% prox smooth LAD stenosis between 1st and 2nd diagonal, normal Cfx & RCA (Dr. Corky Downs)  . Coronary angioplasty with stent placement  10/31/2000    single vessel CAD (mild degree in mid-LAD 60% stenosis) - predilatation of plaque in mid LAD with cutting balloon angioplasty; 3.0x71mm Nir Elite stent to mid-LAD (Dr. Domenic Moras)  . Cardiac catheterization  02/20/2001    normal LV function, 50% stenosis prox to prox 3.5x72mm Nir Elite stent, 80% stenosis in a 1st septal perforating artery (Dr. Corky Downs)  . Cardiac catheterization  06/18/2006    patent LAD stent, dominant Cfx, low-normal EF (Dr. Domenic Moras)  . Cardiac catheterization  08/10/2010    patent mid LAD stent, nonobstructive CAD (Dr. Alma Friendly)    FAMILY HISTORY: Family History  Problem Relation Age of Onset  . Heart failure Father     SOCIAL HISTORY:  Social History   Social History  . Marital Status: Widowed    Spouse Name: N/A  . Number of Children: 3  . Years of Education: 12   Occupational History  . Retired    Social History Main Topics  . Smoking status: Never Smoker   . Smokeless tobacco: Never Used  . Alcohol Use: No  . Drug Use: No  . Sexual Activity: Not on file   Other Topics Concern  . Not on file   Social History Narrative   PT lives at home with her spouse.   Caffeine Use- 3 to 4 cups daily     PHYSICAL EXAM  Filed Vitals:   09/20/15 0812  BP: 112/67  Pulse: 80  Height: 5\' 6"  (1.676 m)  Weight: 182 lb (82.555 kg)   Body mass index is 29.39 kg/(m^2).  EXAM: General: Patient is awake, alert and in no acute distress.  Well developed and groomed. Cardiovascular: No carotid artery bruits.  Heart is regular rate and rhythm with no murmurs.  Neurologic Exam  Mental Status: Awake, alert. Language is fluent and comprehension intact. Cranial Nerves: Masked facial expression. Pupils are equal and reactive to light.  Visual fields are full to confrontation.  Conjugate eye movements are full, saccadic .  Ptosis on the left.  Facial sensation and strength are symmetric.  Hearing is  intact to finger rub.  Palate elevated symmetrically and uvula is midline.  Shoulder shrug is symmetric.  Tongue is midline. Tremor at the soft pallate.  Motor: INTERMITTENT LUE > RUE MILD REST TREMOR. MILD POSTURAL TREMOR OF BUE.  MILD HEAD TREMOR.  MILD COGWHEELING WITH REINFORCEMENT.  MOD BRADYKINESIA IN LUE > RUE; RLE > LLE. Normal bulk. Full strength in the upper and lower extremities.  No pronator drift. Sensory: Intact and symmetric to light touch.  Coordination: No ataxia or dysmetria on finger-nose or rapid alternating movement testing. Gait and Station: ABLE TO STAND, WALK, TURN, SMOOTHLY. LEFT HAND TREMOR; RIGHT HAND USING CANE   DIAGNOSTIC DATA (LABS, IMAGING, TESTING) - I reviewed patient records, labs, notes, testing and imaging myself where available.  Lab Results  Component Value Date   WBC 9.2 11/01/2011   HGB 12.9 11/01/2011   HCT 38.3 11/01/2011   MCV 87.2 11/01/2011   PLT 312 11/01/2011      Component Value Date/Time   NA 138 11/01/2011 0055   K 4.1 11/01/2011 0055   CL 102 11/01/2011 0055   CO2 25 11/01/2011 0055   GLUCOSE 218* 11/01/2011 0055   BUN 16 11/01/2011 0055   CREATININE 0.89 11/01/2011 0055   CALCIUM 9.7 11/01/2011 0055   PROT 7.3 08/10/2010 0149   ALBUMIN 3.7 08/10/2010 0149   AST 23 08/10/2010 0149   ALT 14 08/10/2010 0149   ALKPHOS 91 08/10/2010 0149   BILITOT 0.5 08/10/2010 0149   GFRNONAA 63* 11/01/2011 0055   GFRAA 73* 11/01/2011 0055   Lab Results  Component Value Date   CHOL 208* 11/01/2011   HDL 54 11/01/2011   LDLCALC 122* 11/01/2011   TRIG 158* 11/01/2011   CHOLHDL 3.9 11/01/2011   Lab Results  Component Value Date   HGBA1C 6.8* 11/01/2011   No results found for: [id number] Lab Results  Component Value Date   TSH 3.446 08/10/2010    ASSESSMENT AND PLAN  74 y.o. year old female  has a past medical history of Coronary artery disease; Diabetes mellitus; Dyslipidemia; HTN (hypertension); Parkinson disease ([place]); Anxiety;  and GERD (gastroesophageal reflux disease). here with Parkinson's disease.   Doing much better with rytary with tremor control. Still with balance issues and fall risk.  Also reports strong fam hx of essential tremor, PD, and ALS. Could represent a genetic/familial form of PD for patient.    PLAN: I spent 25 minutes of face to face time with patient. Greater than 50% of time was spent in counseling and coordination of care with patient. In summary we discussed:  - continue rytary; increase to 23.75/96 4 caps TID - caution with balance, fall risk --> use walker - caution with living alone; agree with plan per daughter and pt to live together   Meds ordered this encounter  Medications  . Carbidopa-Levodopa ER (RYTARY) 23.75-95 MG CPCR    Sig: Take 4 capsules by mouth 3 (three) times daily.    Dispense:  1080 capsule    Refill:  4   Return in about 3 months (around 12/21/2015).     Penni Bombard, MD 31/49/7026, 3:78 AM Certified in Neurology, Neurophysiology and Neuroimaging  [facility] [address] [place], [place] [address] [phone]

## 2015-09-20 NOTE — Patient Instructions (Signed)
Thank you for coming to see [place] at [facility]. I hope we have been able to provide you high quality care today.  You may receive a patient satisfaction survey over the next few weeks. We would appreciate your feedback and comments so that we may continue to improve ourselves and the health of our patients.  - increase rytary to 4 caps three times per day   ~~~~~~~~~~~~~~~~~~~~~~~~~~~~~~~~~~~~~~~~~~~~~~~~~~~~~~~~~~~~~~~~~  DR. PENUMALLI'S GUIDE TO HAPPY AND HEALTHY LIVING These are some of my general health and wellness recommendations. Some of them may apply to you better than others. Please use common sense as you try these suggestions and feel free to ask me any questions.   ACTIVITY/FITNESS Mental, social, emotional and physical stimulation are very important for brain and body health. Try learning a new activity (arts, music, language, sports, games).  Keep moving your body to the best of your abilities. Consider a physical therapist or personal trainer to get started. Consider the app Sworkit. Fitness trackers such as smart-watches, smart-phones or Fitbits can help as well.   NUTRITION Eat more plants: colorful vegetables, nuts, seeds and berries.  Eat less sugar, salt, preservatives and processed foods.  Avoid toxins such as cigarettes and alcohol.  Drink water when you are thirsty. Warm water with a slice of lemon is an excellent morning drink to start the day.  Consider these websites for more information The Nutrition Source (https://www.henry-hernandez.biz/) [employer] Nutrition (WindowBlog.ch)   RELAXATION Consider practicing mindfulness meditation or other relaxation techniques such as deep breathing, prayer, yoga, tai chi, massage. See website mindful.org or the apps Headspace or Calm to help get started.   SLEEP Try to get at least 7-8+ hours sleep per day. Regular exercise and reduced caffeine will help  you sleep better. Practice good sleep hygeine techniques. See website sleep.org for more information.   PLANNING Prepare estate planning, living will, healthcare POA documents. Sometimes this is best planned with the help of an attorney. Theconversationproject.org and agingwithdignity.org are excellent resources.

## 2015-09-30 ENCOUNTER — Encounter: Payer: Self-pay | Admitting: Diagnostic Neuroimaging

## 2015-10-03 ENCOUNTER — Observation Stay (HOSPITAL_COMMUNITY)
Admission: EM | Admit: 2015-10-03 | Discharge: 2015-10-04 | Disposition: A | Discharge status: Home / Self Care | Payer: Medicare Other | Attending: Internal Medicine | Admitting: Internal Medicine

## 2015-10-03 ENCOUNTER — Emergency Department (HOSPITAL_COMMUNITY): Payer: Medicare Other

## 2015-10-03 ENCOUNTER — Encounter (HOSPITAL_COMMUNITY): Payer: Self-pay | Admitting: General Practice

## 2015-10-03 DIAGNOSIS — Z9861 Coronary angioplasty status: Secondary | ICD-10-CM | POA: Insufficient documentation

## 2015-10-03 DIAGNOSIS — E119 Type 2 diabetes mellitus without complications: Secondary | ICD-10-CM | POA: Diagnosis not present

## 2015-10-03 DIAGNOSIS — G20 Parkinson's disease: Secondary | ICD-10-CM | POA: Insufficient documentation

## 2015-10-03 DIAGNOSIS — E7800 Pure hypercholesterolemia, unspecified: Secondary | ICD-10-CM | POA: Diagnosis not present

## 2015-10-03 DIAGNOSIS — Z79899 Other long term (current) drug therapy: Secondary | ICD-10-CM | POA: Diagnosis not present

## 2015-10-03 DIAGNOSIS — G20A1 Parkinson's disease without dyskinesia, without mention of fluctuations: Secondary | ICD-10-CM | POA: Diagnosis present

## 2015-10-03 DIAGNOSIS — R0789 Other chest pain: Secondary | ICD-10-CM | POA: Diagnosis not present

## 2015-10-03 DIAGNOSIS — I2510 Atherosclerotic heart disease of native coronary artery without angina pectoris: Secondary | ICD-10-CM | POA: Diagnosis present

## 2015-10-03 DIAGNOSIS — R1013 Epigastric pain: Secondary | ICD-10-CM | POA: Diagnosis not present

## 2015-10-03 DIAGNOSIS — I25719 Atherosclerosis of autologous vein coronary artery bypass graft(s) with unspecified angina pectoris: Secondary | ICD-10-CM | POA: Diagnosis not present

## 2015-10-03 DIAGNOSIS — F419 Anxiety disorder, unspecified: Secondary | ICD-10-CM | POA: Diagnosis not present

## 2015-10-03 DIAGNOSIS — K219 Gastro-esophageal reflux disease without esophagitis: Secondary | ICD-10-CM | POA: Diagnosis not present

## 2015-10-03 DIAGNOSIS — F411 Generalized anxiety disorder: Secondary | ICD-10-CM | POA: Diagnosis present

## 2015-10-03 DIAGNOSIS — R079 Chest pain, unspecified: Secondary | ICD-10-CM | POA: Diagnosis not present

## 2015-10-03 DIAGNOSIS — E785 Hyperlipidemia, unspecified: Secondary | ICD-10-CM | POA: Diagnosis present

## 2015-10-03 DIAGNOSIS — G4733 Obstructive sleep apnea (adult) (pediatric): Secondary | ICD-10-CM | POA: Diagnosis present

## 2015-10-03 LAB — CBC
HEMATOCRIT: 37.7 % (ref 36.0–46.0)
HEMATOCRIT: 38.6 % (ref 36.0–46.0)
HEMOGLOBIN: 12.6 g/dL (ref 12.0–15.0)
HEMOGLOBIN: 12.3 g/dL (ref 12.0–15.0)
MCH: 29.4 pg (ref 26.0–34.0)
MCH: 28.3 pg (ref 26.0–34.0)
MCHC: 33.4 g/dL (ref 30.0–36.0)
MCHC: 31.9 g/dL (ref 30.0–36.0)
MCV: 88.1 fL (ref 78.0–100.0)
MCV: 88.9 fL (ref 78.0–100.0)
Platelets: 261 10*3/uL (ref 150–400)
Platelets: 274 10*3/uL (ref 150–400)
RBC: 4.28 MIL/uL (ref 3.87–5.11)
RBC: 4.34 MIL/uL (ref 3.87–5.11)
RDW: 13.8 % (ref 11.5–15.5)
RDW: 13.9 % (ref 11.5–15.5)
WBC: 7.4 10*3/uL (ref 4.0–10.5)
WBC: 7.3 10*3/uL (ref 4.0–10.5)

## 2015-10-03 LAB — CREATININE, SERUM
Creatinine, Ser: 0.97 mg/dL (ref 0.44–1.00)
GFR calc Af Amer: >60 mL/min (ref >60)
GFR, EST NON AFRICAN AMERICAN: 55 mL/min — AB (ref >60)

## 2015-10-03 LAB — GLUCOSE, CAPILLARY
GLUCOSE-CAPILLARY: 136 mg/dL — AB (ref 65–99)
Glucose-Capillary: 95 mg/dL (ref 65–99)

## 2015-10-03 LAB — TROPONIN I

## 2015-10-03 LAB — BASIC METABOLIC PANEL
ANION GAP: 12 (ref 5–15)
BUN: 16 mg/dL (ref 6–20)
CALCIUM: 9.4 mg/dL (ref 8.9–10.3)
CO2: 20 mmol/L — AB (ref 22–32)
Chloride: 106 mmol/L (ref 101–111)
Creatinine, Ser: 0.99 mg/dL (ref 0.44–1.00)
GFR calc non Af Amer: 54 mL/min — ABNORMAL LOW (ref >60)
Glucose, Bld: 121 mg/dL — ABNORMAL HIGH (ref 65–99)
POTASSIUM: 4.4 mmol/L (ref 3.5–5.1)
SODIUM: 138 mmol/L (ref 135–145)

## 2015-10-03 LAB — I-STAT TROPONIN, ED: TROPONIN I, POC: 0 ng/mL (ref 0.00–0.08)

## 2015-10-03 MED ORDER — SERTRALINE HCL 50 MG PO TABS
50.0000 mg | ORAL_TABLET | Freq: Every day | ORAL | Status: DC
  Administered 2015-10-03 – 2015-10-04 (×2): 50 mg via ORAL
  Filled 2015-10-03 (×2): qty 1

## 2015-10-03 MED ORDER — SODIUM CHLORIDE 0.9 % IV SOLN: 250.0000 mL | INTRAVENOUS | Status: DC | PRN

## 2015-10-03 MED ORDER — ONDANSETRON HCL 4 MG/2ML IJ SOLN: 4.0000 mg | Freq: Four times a day (QID) | INTRAMUSCULAR | Status: DC | PRN

## 2015-10-03 MED ORDER — CARBIDOPA-LEVODOPA ER 23.75-95 MG PO CPCR
4.0000 | ORAL_CAPSULE | ORAL | Status: DC
  Administered 2015-10-03 – 2015-10-04 (×3): 4 via ORAL
  Filled 2015-10-03 (×2): qty 4

## 2015-10-03 MED ORDER — GI COCKTAIL ~~LOC~~
30.0000 mL | Freq: Once | ORAL | Status: AC
  Administered 2015-10-03: 30 mL via ORAL
  Filled 2015-10-03: qty 30

## 2015-10-03 MED ORDER — LORAZEPAM 0.5 MG PO TABS
0.5000 mg | ORAL_TABLET | Freq: Three times a day (TID) | ORAL | Status: DC
  Filled 2015-10-03: qty 1

## 2015-10-03 MED ORDER — SODIUM CHLORIDE 0.9 % IJ SOLN
3.0000 mL | Freq: Two times a day (BID) | INTRAMUSCULAR | Status: DC
  Administered 2015-10-03 – 2015-10-04 (×3): 3 mL via INTRAVENOUS

## 2015-10-03 MED ORDER — CARBIDOPA-LEVODOPA ER 23.75-95 MG PO CPCR
4.0000 | ORAL_CAPSULE | Freq: Three times a day (TID) | ORAL | Status: DC
  Filled 2015-10-03: qty 4

## 2015-10-03 MED ORDER — ACETAMINOPHEN 325 MG PO TABS
650.0000 mg | ORAL_TABLET | ORAL | Status: DC | PRN
  Administered 2015-10-03: 650 mg via ORAL
  Filled 2015-10-03: qty 2

## 2015-10-03 MED ORDER — NITROGLYCERIN 0.4 MG SL SUBL: 0.4000 mg | SUBLINGUAL_TABLET | SUBLINGUAL | Status: DC | PRN

## 2015-10-03 MED ORDER — METFORMIN HCL 500 MG PO TABS
1000.0000 mg | ORAL_TABLET | Freq: Two times a day (BID) | ORAL | Status: DC
  Administered 2015-10-03 – 2015-10-04 (×3): 1000 mg via ORAL
  Filled 2015-10-03 (×3): qty 2

## 2015-10-03 MED ORDER — ENOXAPARIN SODIUM 40 MG/0.4ML ~~LOC~~ SOLN
40.0000 mg | SUBCUTANEOUS | Status: DC
Start: 2015-10-03 — End: 2015-10-04
  Administered 2015-10-03: 40 mg via SUBCUTANEOUS
  Filled 2015-10-03 (×2): qty 0.4

## 2015-10-03 MED ORDER — CARBIDOPA-LEVODOPA ER 23.75-95 MG PO CPCR: 4.0000 | ORAL_CAPSULE | Freq: Three times a day (TID) | ORAL | Status: DC

## 2015-10-03 MED ORDER — VITAMIN D 1000 UNITS PO TABS
1000.0000 [IU] | ORAL_TABLET | Freq: Every day | ORAL | Status: DC
  Administered 2015-10-03 – 2015-10-04 (×2): 1000 [IU] via ORAL
  Filled 2015-10-03 (×2): qty 1

## 2015-10-03 MED ORDER — RIVASTIGMINE 4.6 MG/24HR TD PT24
4.6000 mg | MEDICATED_PATCH | Freq: Every day | TRANSDERMAL | Status: DC
  Administered 2015-10-04: 4.6 mg via TRANSDERMAL
  Filled 2015-10-03: qty 1

## 2015-10-03 MED ORDER — SODIUM CHLORIDE 0.9 % IJ SOLN: 3.0000 mL | INTRAMUSCULAR | Status: DC | PRN

## 2015-10-03 NOTE — H&P (Signed)
[facility] History and Physical  NORVELL URESTE SMO:[id number] DOB: 1941-08-14 DOA: 10/03/2015   PCP: [name], MD    Chief Complaint: chest pain  HPI: [name] is a 74 y.o. female with h/o stent to the LAD in 2001, DM 2, HTN who presents for chest pain which started when she drank her coffee this morning. It was present in the midsternal area and a little to the right of the sternum. She did not have associated nausea, diaphoresis, shortness of breath or palpitations. It did not radiate. It eased up a little but then returned soon after she had a piece of toast. It has been persistent since then. At its maximum it was about a 3 out of 10. No complaints of heartburn or GI issues in the past. She has not had this pain recently at rest or with exertion and today was the first time she felt the pain. Pain does not improve or get exacerbated based on position.  ROS  General: The patient denies anorexia, fever, weight loss + hair falling Cardiac: Denies chest pain, syncope, palpitations, + pedal edema  Respiratory: Denies cough, shortness of breath, wheezing GI: Denies severe indigestion/heartburn, abdominal pain, nausea, vomiting, + diarrhea and constipation GU: Denies hematuria,  dysuria + incontinence Musculoskeletal: Denies arthritis  Skin: Denies suspicious skin lesions Neurologic: Denies focal weakness or numbness, change in vision Psychiatry: Denies depression or anxiety. Hematologic: no easy bruising or bleeding  All other systems reviewed and found to be negative.  Past Medical History  Diagnosis Date  . Coronary artery disease     Mid LAD Stent 2001 Patent By cath on 08/10/2010  . Diabetes mellitus     Type II  . Dyslipidemia   . HTN (hypertension)   . Parkinson disease ([place])   . Anxiety   . GERD (gastroesophageal reflux disease)     Past Surgical History  Procedure Laterality Date  . Cholecystectomy  1963  . Appendectomy    . Hysterectomy  1989  .  Transthoracic echocardiogram  2012    EF 60-65%, mild AV regurg, LA mildly dilated, PA peak pressure 31mmHg  . Nm myocar perf wall motion  2012    lexiscan - normal perfusion, EF 71%, low risk  . Cardiac catheterization  07/02/2000    normal LV function, 40-50% prox smooth LAD stenosis between 1st and 2nd diagonal, normal Cfx & RCA (Dr. Corky Downs)  . Coronary angioplasty with stent placement  10/31/2000    single vessel CAD (mild degree in mid-LAD 60% stenosis) - predilatation of plaque in mid LAD with cutting balloon angioplasty; 3.0x107mm Nir Elite stent to mid-LAD (Dr. Domenic Moras)  . Cardiac catheterization  02/20/2001    normal LV function, 50% stenosis prox to prox 3.5x45mm Nir Elite stent, 80% stenosis in a 1st septal perforating artery (Dr. Corky Downs)  . Cardiac catheterization  06/18/2006    patent LAD stent, dominant Cfx, low-normal EF (Dr. Domenic Moras)  . Cardiac catheterization  08/10/2010    patent mid LAD stent, nonobstructive CAD (Dr. Alma Friendly)    Social History: does not smoke or drink alcohol Lives at home alone and manages ADLs well    Allergies  Allergen Reactions  . Aspirin Anaphylaxis    swelling    Family history:   Family History  Problem Relation Age of Onset  . Heart failure Father      Prior to Admission medications   Medication Sig Start Date End Date Taking? Authorizing Provider  Carbidopa-Levodopa ER (RYTARY) 23.75-95 MG CPCR Take 4 capsules by mouth 3 (three) times daily. 09/20/15  Yes Penni Bombard, MD  cholecalciferol (VITAMIN D) 1000 UNITS tablet Take 1,000 Units by mouth daily.   Yes Historical Provider, MD  LORazepam (ATIVAN) 0.5 MG tablet Take 0.5 mg by mouth every 8 (eight) hours. 1 bid 05/09/13  Yes Norma Fredrickson, MD  metFORMIN (GLUCOPHAGE) 1000 MG tablet Take 1,000 mg by mouth 2 (two) times daily with a meal.     Yes Historical Provider, MD  rivastigmine (EXELON) 4.6 mg/24hr Place 1 patch (4.6 mg total) onto the skin daily. 07/28/13  Yes Penni Bombard, MD  sertraline (ZOLOFT) 50 MG tablet Take 50 mg by mouth daily.   Yes Historical Provider, MD     Physical Exam: Filed Vitals:   10/03/15 1109 10/03/15 1113 10/03/15 1115 10/03/15 1200  BP:  133/54 130/55 133/91  Pulse:  70 69 72  Temp:  97.9 F (36.6 C)    Resp:  16 17 19   Height:  5\' 6"  (1.676 m)    Weight:  81.647 kg (180 lb)    SpO2: 99% 100% 100% 100%     General: Awake alert oriented 3 no acute distress HEENT: Normocephalic and Atraumatic, Mucous membranes pink                PERRLA; EOM intact; No scleral icterus,                 Nares: Patent, Oropharynx: Clear, Fair Dentition                 Neck: FROM, no cervical lymphadenopathy, thyromegaly, carotid bruit or JVD;  Breasts: deferred CHEST WALL: No producible tenderness  CHEST: Normal respiration, clear to auscultation bilaterally  HEART: Regular rate and rhythm; no murmurs rubs or gallops  BACK: No kyphosis or scoliosis; no CVA tenderness  GI: Positive Bowel Sounds, soft, non-tender; no masses, no organomegaly Rectal Exam: deferred MSK: No cyanosis, clubbing, or edema Genitalia: not examined  SKIN:  no rash or ulceration  CNS: Alert and Oriented x 4, Nonfocal exam, CN 2-12 intact  Labs on Admission:  Basic Metabolic Panel:  Recent Labs Lab 10/03/15 1143  NA 138  K 4.4  CL 106  CO2 20*  GLUCOSE 121*  BUN 16  CREATININE 0.99  CALCIUM 9.4   Liver Function Tests: No results for input(s): AST, ALT, ALKPHOS, BILITOT, PROT, ALBUMIN in the last 168 hours. No results for input(s): LIPASE, AMYLASE in the last 168 hours. No results for input(s): AMMONIA in the last 168 hours. CBC:  Recent Labs Lab 10/03/15 1143  WBC 7.4  HGB 12.6  HCT 37.7  MCV 88.1  PLT 261   Cardiac Enzymes: No results for input(s): CKTOTAL, CKMB, CKMBINDEX, TROPONINI in the last 168 hours.  BNP (last 3 results) No results for input(s): BNP in the last 8760 hours.  ProBNP (last 3 results) No results for input(s):  PROBNP in the last 8760 hours.  CBG: No results for input(s): GLUCAP in the last 168 hours.  Radiological Exams on Admission: No results found.  EKG: Independently reviewed. Inverted t waves in aVL and v 2.  Assessment/Plan Principal Problem:   Chest pain- atypical, possibly GI related- h/o  Coronary artery disease - We'll try GI cocktail. The patient has not received nitroglycerin and therefore if pain persists after the cocktail, we'll try sublingual nitroglycerin. - EKG reveal sinus rhythm with inverted t waves as mentioned above -  pain is not similar to the pain she had when she had her MI in 2001 -  Follow Troponin- will hold off on calling a cardiology consult at today  Active Problems:   Parkinson's disease  - cont Carbidopa-levodopa    OSA - have ordered C PAP    Generalized anxiety disorder - cont Ativan and Zoloft    DM type 2 goal A1C below 7.5 - hold Metformin- place on sliding scale insulin     Consulted:   Code Status: full code Family Communication:    DVT Prophylaxis:Lovenox  Time spent: 24 min  [place], MD [facility]  If 7PM-7AM, please contact night-coverage [email or web address] 10/03/2015, 12:57 PM

## 2015-10-03 NOTE — ED Notes (Signed)
Pt brought in via [facility] with complaints of chest pain that started around 0900 this morning. Pt reporting pain as a sharp pain that sudden in onset, and in her epigastric region. Pt denies having any chest pain at this time. Pt has a history of GERD, cardiac stents, parkinson's, and diabetes.

## 2015-10-03 NOTE — ED Provider Notes (Signed)
CSN: [id number]     Arrival date & time 10/03/15  1103 History   First MD Initiated Contact with Patient 10/03/15 1112     Chief Complaint  Patient presents with  . Chest Pain     (Consider location/radiation/quality/duration/timing/severity/associated sxs/prior Treatment) Patient is a 74 y.o. female presenting with chest pain. The history is provided by the patient.  Chest Pain Pain location:  Substernal area Pain quality: sharp (intermittent) and shooting   Pain radiates to:  Does not radiate Pain radiates to the back: no   Pain severity:  Moderate Onset quality:  Gradual Duration:  1 day Timing:  Sporadic Progression:  Unchanged Chronicity:  New Context: eating (and belching)   Relieved by:  Nothing Worsened by:  Nothing tried Ineffective treatments:  None tried Associated symptoms: no abdominal pain, no cough, no fever, no lower extremity edema and no shortness of breath   Risk factors: coronary artery disease, diabetes mellitus and high cholesterol   Risk factors: no immobilization, not female and no smoking     Past Medical History  Diagnosis Date  . Coronary artery disease     Mid LAD Stent 2001 Patent By cath on 08/10/2010  . Diabetes mellitus     Type II  . Dyslipidemia   . HTN (hypertension)   . Parkinson disease ([place])   . Anxiety   . GERD (gastroesophageal reflux disease)    Past Surgical History  Procedure Laterality Date  . Cholecystectomy  1963  . Appendectomy    . Hysterectomy  1989  . Transthoracic echocardiogram  2012    EF 60-65%, mild AV regurg, LA mildly dilated, PA peak pressure 23mmHg  . Nm myocar perf wall motion  2012    lexiscan - normal perfusion, EF 71%, low risk  . Cardiac catheterization  07/02/2000    normal LV function, 40-50% prox smooth LAD stenosis between 1st and 2nd diagonal, normal Cfx & RCA (Dr. Corky Downs)  . Coronary angioplasty with stent placement  10/31/2000    single vessel CAD (mild degree in mid-LAD 60% stenosis) -  predilatation of plaque in mid LAD with cutting balloon angioplasty; 3.0x23mm Nir Elite stent to mid-LAD (Dr. Domenic Moras)  . Cardiac catheterization  02/20/2001    normal LV function, 50% stenosis prox to prox 3.5x43mm Nir Elite stent, 80% stenosis in a 1st septal perforating artery (Dr. Corky Downs)  . Cardiac catheterization  06/18/2006    patent LAD stent, dominant Cfx, low-normal EF (Dr. Domenic Moras)  . Cardiac catheterization  08/10/2010    patent mid LAD stent, nonobstructive CAD (Dr. Alma Friendly)   Family History  Problem Relation Age of Onset  . Heart failure Father    Social History  Substance Use Topics  . Smoking status: Never Smoker   . Smokeless tobacco: Never Used  . Alcohol Use: No   OB History    No data available     Review of Systems  Constitutional: Negative for fever.  Respiratory: Negative for cough and shortness of breath.   Cardiovascular: Positive for chest pain.  Gastrointestinal: Negative for abdominal pain.  All other systems reviewed and are negative.     Allergies  Aspirin  Home Medications   Prior to Admission medications   Medication Sig Start Date End Date Taking? Authorizing Provider  Carbidopa-Levodopa ER (RYTARY) 23.75-95 MG CPCR Take 4 capsules by mouth 3 (three) times daily. 09/20/15   Penni Bombard, MD  LORazepam (ATIVAN) 0.5 MG tablet Take 0.5 mg by mouth  every 8 (eight) hours. 1 bid 05/09/13   Norma Fredrickson, MD  metFORMIN (GLUCOPHAGE) 1000 MG tablet Take 1,000 mg by mouth 2 (two) times daily with a meal.      Historical Provider, MD  rivastigmine (EXELON) 4.6 mg/24hr Place 1 patch (4.6 mg total) onto the skin daily. 07/28/13   Penni Bombard, MD  sertraline (ZOLOFT) 50 MG tablet Take 50 mg by mouth daily.    Historical Provider, MD  tiZANidine (ZANAFLEX) 4 MG tablet Take 1 tablet (4 mg total) by mouth every 8 (eight) hours as needed for muscle spasms. 06/03/15   Earlean Polka Penumalli, MD   BP 133/54 mmHg  Pulse 70  Temp(Src) 97.9 F (36.6  C)  Resp 16  Ht 5\' 6"  (1.676 m)  Wt 180 lb (81.647 kg)  BMI 29.07 kg/m2  SpO2 100% Physical Exam  Constitutional: She is oriented to person, place, and time. She appears well-developed and well-nourished. No distress.  HENT:  Head: Normocephalic.  Eyes: Conjunctivae are normal.  Neck: Neck supple. No tracheal deviation present.  Cardiovascular: Normal rate, regular rhythm and normal heart sounds.   Pulmonary/Chest: Effort normal and breath sounds normal. No respiratory distress.  Abdominal: Soft. She exhibits no distension. There is no tenderness.  Neurological: She is alert and oriented to person, place, and time.  Skin: Skin is warm and dry.  Psychiatric: She has a normal mood and affect.    ED Course  Procedures (including critical care time) Labs Review Labs Reviewed  BASIC METABOLIC PANEL - Abnormal; Notable for the following:    CO2 20 (*)    Glucose, Bld 121 (*)    GFR calc non Af Amer 54 (*)    All other components within normal limits  CBC  I-STAT TROPOININ, ED    Imaging Review Dg Chest 2 View  10/03/2015  CLINICAL DATA:  Chest pain beginning today. EXAM: CHEST - 2 VIEW COMPARISON:  Two-view chest x-ray 11/01/2011 FINDINGS: The heart size is normal. Atherosclerotic calcifications are present at the aorta. There is no edema or effusion to suggest failure. Lung volumes are low. The visualized soft tissues and bony thorax are unremarkable. IMPRESSION: 1. Low lung volumes. 2. Atherosclerosis. 3. No acute cardiopulmonary disease. Electronically Signed   By: San Morelle M.D.   On: 10/03/2015 13:17   I have personally reviewed and evaluated these images and lab results as part of my medical decision-making.   EKG Interpretation   Date/Time:  Sunday October 03 2015 11:09:36 EDT Ventricular Rate:  69 PR Interval:  162 QRS Duration: 85 QT Interval:  398 QTC Calculation: 426 R Axis:   49 Text Interpretation:  Sinus rhythm Since last tracing rate slower   Confirmed by Jaianna Nicoll MD, [place] Coffin ([id number]) on 10/03/2015 11:40:03 AM      MDM   Final diagnoses:  Chest pain at rest    74 year old female presents with chest pain started 1-2 hours prior to arrival that is sharp and constant, sudden, and includes epigastrium. She states that she burst but did not relieve the pain. She is having intermittently but not while in the ED. She does have a history of a mid LAD stent placed in 2001 that was patent on last catheterization 5 years ago. High risk for ACS given her coronary artery disease, hypertension, hyperlipidemia, and diabetes as well as advanced age. Unable to rule out with troponin in the emergency department. First troponin is negative, will admit for observation to hospitalist service with cardiology involvement as  needed.    Leo Grosser, MD 10/04/15 [phone]

## 2015-10-04 ENCOUNTER — Ambulatory Visit (HOSPITAL_BASED_OUTPATIENT_CLINIC_OR_DEPARTMENT_OTHER): Payer: Medicare Other

## 2015-10-04 ENCOUNTER — Other Ambulatory Visit: Payer: Self-pay

## 2015-10-04 DIAGNOSIS — I2510 Atherosclerotic heart disease of native coronary artery without angina pectoris: Secondary | ICD-10-CM

## 2015-10-04 DIAGNOSIS — G4733 Obstructive sleep apnea (adult) (pediatric): Secondary | ICD-10-CM

## 2015-10-04 DIAGNOSIS — G20 Parkinson's disease: Secondary | ICD-10-CM

## 2015-10-04 DIAGNOSIS — E119 Type 2 diabetes mellitus without complications: Secondary | ICD-10-CM | POA: Diagnosis not present

## 2015-10-04 DIAGNOSIS — R1013 Epigastric pain: Secondary | ICD-10-CM | POA: Diagnosis not present

## 2015-10-04 DIAGNOSIS — R072 Precordial pain: Secondary | ICD-10-CM | POA: Diagnosis not present

## 2015-10-04 DIAGNOSIS — F411 Generalized anxiety disorder: Secondary | ICD-10-CM | POA: Diagnosis not present

## 2015-10-04 DIAGNOSIS — E785 Hyperlipidemia, unspecified: Secondary | ICD-10-CM | POA: Diagnosis not present

## 2015-10-04 DIAGNOSIS — G20A1 Parkinson's disease without dyskinesia, without mention of fluctuations: Secondary | ICD-10-CM

## 2015-10-04 DIAGNOSIS — R079 Chest pain, unspecified: Secondary | ICD-10-CM | POA: Diagnosis not present

## 2015-10-04 LAB — BASIC METABOLIC PANEL
ANION GAP: 9 (ref 5–15)
BUN: 16 mg/dL (ref 6–20)
CHLORIDE: 103 mmol/L (ref 101–111)
CO2: 24 mmol/L (ref 22–32)
CREATININE: 1.01 mg/dL — AB (ref 0.44–1.00)
Calcium: 9 mg/dL (ref 8.9–10.3)
GFR calc non Af Amer: 53 mL/min — ABNORMAL LOW (ref >60)
Glucose, Bld: 107 mg/dL — ABNORMAL HIGH (ref 65–99)
Potassium: 5.1 mmol/L (ref 3.5–5.1)
SODIUM: 136 mmol/L (ref 135–145)

## 2015-10-04 LAB — CBC
HCT: 35.1 % — ABNORMAL LOW (ref 36.0–46.0)
HEMOGLOBIN: 11.2 g/dL — AB (ref 12.0–15.0)
MCH: 28.4 pg (ref 26.0–34.0)
MCHC: 31.9 g/dL (ref 30.0–36.0)
MCV: 88.9 fL (ref 78.0–100.0)
PLATELETS: 279 10*3/uL (ref 150–400)
RBC: 3.95 MIL/uL (ref 3.87–5.11)
RDW: 13.6 % (ref 11.5–15.5)
WBC: 6.9 10*3/uL (ref 4.0–10.5)

## 2015-10-04 LAB — D-DIMER, QUANTITATIVE (NOT AT ARMC)

## 2015-10-04 LAB — GLUCOSE, CAPILLARY
GLUCOSE-CAPILLARY: 165 mg/dL — AB (ref 65–99)
Glucose-Capillary: 91 mg/dL (ref 65–99)
Glucose-Capillary: 122 mg/dL — ABNORMAL HIGH (ref 65–99)

## 2015-10-04 LAB — TROPONIN I: Troponin I: <0.03 ng/mL (ref <0.031)

## 2015-10-04 MED ORDER — GI COCKTAIL ~~LOC~~
30.0000 mL | Freq: Three times a day (TID) | ORAL | Status: DC | PRN
  Filled 2015-10-04: qty 30

## 2015-10-04 MED ORDER — CARBIDOPA-LEVODOPA ER 23.75-95 MG PO CPCR: 4.0000 | ORAL_CAPSULE | Freq: Three times a day (TID) | ORAL | Status: DC

## 2015-10-04 MED ORDER — GI COCKTAIL ~~LOC~~: 30.0000 mL | Freq: Three times a day (TID) | ORAL | Status: DC | PRN

## 2015-10-04 MED ORDER — PANTOPRAZOLE SODIUM 40 MG PO TBEC
40.0000 mg | DELAYED_RELEASE_TABLET | Freq: Every day | ORAL | Status: DC
  Administered 2015-10-04: 40 mg via ORAL
  Filled 2015-10-04: qty 1

## 2015-10-04 NOTE — Discharge Instructions (Signed)
Follow with Andrea Redwood, MD in 2-3 weeks   Please request your Primary MD to go over all Hospital Tests and Procedure/Radiological results at the follow up, please get all Hospital records sent to your Prim MD by signing hospital release before you go home.  If you had Pneumonia of Lung problems at the Hospital: Please get a 2 view Chest X ray done in 6-8 weeks after hospital discharge or sooner if instructed by your Primary MD.  If you have Congestive Heart Failure: Please call your Cardiologist or Primary MD anytime you have any of the following symptoms:  1) 3 pound weight gain in 24 hours or 5 pounds in 1 week  2) shortness of breath, with or without a dry hacking cough  3) swelling in the hands, feet or stomach  4) if you have to sleep on extra pillows at night in order to breathe  Follow cardiac low salt diet and 1.5 lit/day fluid restriction.  If you have diabetes Accuchecks 4 times/day, Once in AM empty stomach and then before each meal. Log in all results and show them to your primary doctor at your next visit. If any glucose reading is under 80 or above 300 call your primary MD immediately.  If you have Seizure/Convulsions/Epilepsy: Please do not drive, operate heavy machinery, participate in activities at heights or participate in high speed sports until you have seen by Primary MD or a Neurologist and advised to do so again.  If you had Gastrointestinal Bleeding: Please ask your Primary MD to check a complete blood count within one week of discharge or at your next visit. Your endoscopic/colonoscopic biopsies that are pending at the time of discharge, will also need to followed by your Primary MD.  Get Medicines reviewed and adjusted. Please take all your medications with you for your next visit with your Primary MD  Please request your Primary MD to go over all hospital tests and procedure/radiological results at the follow up, please ask your Primary MD to get all  Hospital records sent to his/her office.  If you experience worsening of your admission symptoms, develop shortness of breath, life threatening emergency, suicidal or homicidal thoughts you must seek medical attention immediately by calling 911 or calling your MD immediately  if symptoms less severe.  You must read complete instructions/literature along with all the possible adverse reactions/side effects for all the Medicines you take and that have been prescribed to you. Take any new Medicines after you have completely understood and accpet all the possible adverse reactions/side effects.   Do not drive or operate heavy machinery when taking Pain medications.   Do not take more than prescribed Pain, Sleep and Anxiety Medications  Special Instructions: If you have smoked or chewed Tobacco  in the last 2 yrs please stop smoking, stop any regular Alcohol  and or any Recreational drug use.  Wear Seat belts while driving.  Please note You were cared for by a hospitalist during your hospital stay. If you have any questions about your discharge medications or the care you received while you were in the hospital after you are discharged, you can call the unit and asked to speak with the hospitalist on call if the hospitalist that took care of you is not available. Once you are discharged, your primary care physician will handle any further medical issues. Please note that NO REFILLS for any discharge medications will be authorized once you are discharged, as it is imperative that you return to  your primary care physician (or establish a relationship with a primary care physician if you do not have one) for your aftercare needs so that they can reassess your need for medications and monitor your lab values.  You can reach the hospitalist office at phone [phone] or fax [phone]   If you do not have a primary care physician, you can call [phone] for a physician referral.  Activity: As tolerated  with Full fall precautions use walker/cane & assistance as needed  Diet: regular  Disposition Home

## 2015-10-04 NOTE — Telephone Encounter (Signed)
Written Rx needed for Assistance Program.  Thank you.

## 2015-10-04 NOTE — Progress Notes (Signed)
Pt and family refused bed alarm during family visitation. RN has requested that family notify RN when family leaves since patient is high fall risk so RN can reactivate bed alarm due to concern for patient without supervision. Family and patient educated on use of call bell and phones in addition to why bed alarm is important. Will continue to hourly round on patient.

## 2015-10-04 NOTE — Progress Notes (Signed)
Pt called this nurse to room to report when she got up to the BR she had a "short, sharp pain" in her chest very similar to pain that brought her to the ED.  Pt states it was not as severe.  Pt states pain only lasted a min or so and she denies having pain at this time.  VS stable/AM EKG to be obtained. Will continue to monitor. Jessie Foot, RN

## 2015-10-04 NOTE — Consult Note (Signed)
CARDIOLOGY CONSULT NOTE   Patient ID: [name] MRN: [id number] DOB/AGE: 1941/04/15 74 y.o.  Admit date: 10/03/2015  Primary Physician   [name], MD Primary Cardiologist   Dr. Claiborne Billings Reason for Consultation   CP  HPI: [name] is a 74 y.o. female with a history of CAD s/p LAD stent in 2001, HTN, HL, DM, parkinson disease and GERD who admitted 10/03/15 with atypical chest pain.   She has established coronary artery disease and in November 2001 underwent initial stenting of her proximal LAD. Catheterization in 2002 which showed a patent LAD stent with 40% narrowing proximal to the stent. Her last catheterization was in 2011 which showed a patent stent without significant other coronary artery disease. She had normal LV function. Her last echo Doppler study was in 2012 which showed an ejection fraction of 60-65%. She had mild LA dilatation. There is mild aortic insufficiency. She did have a nuclear perfusion study in November 2012 which showed normal perfusion.  She was doing well on cardiac stand point of view when seen last by Dr. Claiborne Billings 09/25/14. For the past few months he has been having lower extremity edema, she states that she has been placed on Lasix by her PCP. However, no lasix listed on her home medication.  She was in usual state of health up until yesterday morning when she started to having left-sided sharp chest pain while drinking Coffee that radiated to her right chest for approximately 2 cm. Pain resolved by itself in a few minutes. However, reoccurred while eating toast. Therefore she presented for further evaluation. She denies any exertional chest pain or shortness of breath. He denies palpitations, melena or  blood in her stool. Denies history of tobacco smoking or illicit drug use. She recently traveled to [place] last Monday.  She was given GI cocktail yesterday. She does have a similar episode of chest pain this morning. Currently chest pain-free.  Past  Medical History  Diagnosis Date  . Coronary artery disease     Mid LAD Stent 2001 Patent By cath on 08/10/2010  . Diabetes mellitus     Type II  . Dyslipidemia   . HTN (hypertension)   . Parkinson disease ([place])   . Anxiety   . GERD (gastroesophageal reflux disease)      Past Surgical History  Procedure Laterality Date  . Cholecystectomy  1963  . Appendectomy    . Hysterectomy  1989  . Transthoracic echocardiogram  2012    EF 60-65%, mild AV regurg, LA mildly dilated, PA peak pressure 22mmHg  . Nm myocar perf wall motion  2012    lexiscan - normal perfusion, EF 71%, low risk  . Cardiac catheterization  07/02/2000    normal LV function, 40-50% prox smooth LAD stenosis between 1st and 2nd diagonal, normal Cfx & RCA (Dr. Corky Downs)  . Coronary angioplasty with stent placement  10/31/2000    single vessel CAD (mild degree in mid-LAD 60% stenosis) - predilatation of plaque in mid LAD with cutting balloon angioplasty; 3.0x20mm Nir Elite stent to mid-LAD (Dr. Domenic Moras)  . Cardiac catheterization  02/20/2001    normal LV function, 50% stenosis prox to prox 3.5x37mm Nir Elite stent, 80% stenosis in a 1st septal perforating artery (Dr. Corky Downs)  . Cardiac catheterization  06/18/2006    patent LAD stent, dominant Cfx, low-normal EF (Dr. Domenic Moras)  . Cardiac catheterization  08/10/2010    patent mid LAD stent, nonobstructive CAD (Dr. Alma Friendly)  Allergies  Allergen Reactions  . Aspirin Anaphylaxis    swelling    I have reviewed the patient's current medications . Carbidopa-Levodopa ER  4 capsule Oral 3 times per day  . cholecalciferol  1,000 Units Oral Daily  . enoxaparin (LOVENOX) injection  40 mg Subcutaneous Q24H  . LORazepam  0.5 mg Oral 3 times per day  . metFORMIN  1,000 mg Oral BID WC  . rivastigmine  4.6 mg Transdermal Daily  . sertraline  50 mg Oral Daily  . sodium chloride  3 mL Intravenous Q12H     sodium chloride, acetaminophen, gi cocktail, nitroGLYCERIN, ondansetron  (ZOFRAN) IV, sodium chloride  Prior to Admission medications   Medication Sig Start Date End Date Taking? Authorizing Provider  Carbidopa-Levodopa ER (RYTARY) 23.75-95 MG CPCR Take 4 capsules by mouth 3 (three) times daily. 09/20/15  Yes Penni Bombard, MD  cholecalciferol (VITAMIN D) 1000 UNITS tablet Take 1,000 Units by mouth daily.   Yes Historical Provider, MD  LORazepam (ATIVAN) 0.5 MG tablet Take 0.5 mg by mouth every 8 (eight) hours. 1 bid 05/09/13  Yes Norma Fredrickson, MD  metFORMIN (GLUCOPHAGE) 1000 MG tablet Take 1,000 mg by mouth 2 (two) times daily with a meal.     Yes Historical Provider, MD  rivastigmine (EXELON) 4.6 mg/24hr Place 1 patch (4.6 mg total) onto the skin daily. 07/28/13  Yes Penni Bombard, MD  sertraline (ZOLOFT) 50 MG tablet Take 50 mg by mouth daily.   Yes Historical Provider, MD     Social History   Social History  . Marital Status: Widowed    Spouse Name: N/A  . Number of Children: 3  . Years of Education: 12   Occupational History  . Retired    Social History Main Topics  . Smoking status: Never Smoker   . Smokeless tobacco: Never Used  . Alcohol Use: No  . Drug Use: No  . Sexual Activity: Not on file   Other Topics Concern  . Not on file   Social History Narrative   PT lives at home with her spouse.   Caffeine Use- 3 to 4 cups daily    Family Status  Relation Status Death Age  . Mother 37     10yrs old  . Father Deceased 35   Family History  Problem Relation Age of Onset  . Heart failure Father      ROS:  Full 14 point review of systems complete and found to be negative unless listed above.  Physical Exam: Blood pressure 132/61, pulse 63, temperature 98.2 F (36.8 C), temperature source Oral, resp. rate 18, height 5\' 6"  (1.676 m), weight 179 lb 1.6 oz (81.239 kg), SpO2 100 %.  General: Well developed, well nourished, female in no acute distress Head: Eyes PERRLA, No xanthomas. Normocephalic and atraumatic, oropharynx  without edema or exudate.  Lungs: Resp regular and unlabored, CTA. Heart: RRR no s3, s4, or murmurs..   Neck: No carotid bruits. No lymphadenopathy. +  JVD. Abdomen: Bowel sounds present, abdomen soft and non-tender without masses or hernias noted. Msk:  No spine or cva tenderness. No weakness, no joint deformities or effusions. Extremities: No clubbing, cyanosis. Trace BL LE edema. DP/PT/Radials 2+ and equal bilaterally. Neuro: Alert and oriented X 3. No focal deficits noted. Psych:  Good affect, responds appropriately Skin: No rashes or lesions noted.  Labs:   Lab Results  Component Value Date   WBC PENDING 10/04/2015   HGB 11.2* 10/04/2015   HCT  35.1* 10/04/2015   MCV 88.9 10/04/2015   PLT 279 10/04/2015   No results for input(s): INR in the last 72 hours.  Recent Labs Lab 10/04/15 0045  NA 136  K 5.1  CL 103  CO2 24  BUN 16  CREATININE 1.01*  CALCIUM 9.0  GLUCOSE 107*   MAGNESIUM  Date Value Ref Range Status  08/10/2010 1.8 1.5 - 2.5 mg/dL Final    Recent Labs  10/03/15 1309 10/03/15 1843 10/04/15 0045  TROPONINI <0.03 <0.03 <0.03    Recent Labs  10/03/15 1148  TROPIPOC 0.00   PRO B NATRIURETIC PEPTIDE (BNP)  Date/Time Value Ref Range Status  08/09/2010 11:59 PM 50.0 0.0 - 100.0 pg/mL Final   Lab Results  Component Value Date   CHOL 208* 11/01/2011   HDL 54 11/01/2011   LDLCALC 122* 11/01/2011   TRIG 158* 11/01/2011   No results found for: DDIMER No results found for: LIPASE, AMYLASE TSH  Date/Time Value Ref Range Status  08/10/2010 01:49 AM 3.446 0.350 - 4.500 uIU/mL Final   No results found for: VITAMINB12, FOLATE, FERRITIN, TIBC, IRON, RETICCTPCT  Echo: 11/01/2011 LV EF: 60% -  65%  ------------------------------------------------------------ Indications:   TIA 435.9.  ------------------------------------------------------------ History:  PMH: Shortness of Breath. Coronary artery disease. Risk factors: Diabetes  mellitus.  ------------------------------------------------------------ Study Conclusions  - Left ventricle: The cavity size was normal. Wall thickness was normal. Systolic function was normal. The estimated ejection fraction was in the range of 60% to 65%. Wall motion was normal; there were no regional wall motion abnormalities. Left ventricular diastolic function parameters were normal. - Aortic valve: Mild regurgitation. - Left atrium: The atrium was mildly dilated. - Atrial septum: No defect or patent foramen ovale was identified. - Pulmonary arteries: PA peak pressure: 74mm Hg (S).  ECG:  Vent. rate 62 BPM PR interval 172 ms QRS duration 84 ms QT/QTc 412/418 ms P-R-T axes 34 28 60  Radiology:  Dg Chest 2 View  10/03/2015  CLINICAL DATA:  Chest pain beginning today. EXAM: CHEST - 2 VIEW COMPARISON:  Two-view chest x-ray 11/01/2011 FINDINGS: The heart size is normal. Atherosclerotic calcifications are present at the aorta. There is no edema or effusion to suggest failure. Lung volumes are low. The visualized soft tissues and bony thorax are unremarkable. IMPRESSION: 1. Low lung volumes. 2. Atherosclerosis. 3. No acute cardiopulmonary disease. Electronically Signed   By: San Morelle M.D.   On: 10/03/2015 13:17    ASSESSMENT AND PLAN:     1. Atypical Chest pain - Chest pain is more concerning for GI etiology. She does have a history of GERD, however not on any medication. She was given GI cocktail yesterday, she does have a 1 episode of chest pain this morning. - EKG without ischemic changes. Troponin was 3 negative. - Differential include PE given her recent travel, will get D-dimer.  - Will get echo today, if normal she can f/u with Dr. Claiborne Billings as outpatient. If recurrent chest pain, consider outpatient Myoview. - She takes Lasix as needed for lower extremity edema and takes 1-2 times in a week. - Will start her on a Protonix for GERD.   Otherwise per  IM:    Parkinson's disease ([place])   OSA (obstructive sleep apnea)   Generalized anxiety disorder   Coronary artery disease   Hyperlipidemia LDL goal <70   DM type 2 goal A1C below 7.5   Signed: Bhagat,Bhavinkumar, PA 10/04/2015, 8:56 AM Pager [phone]  Patient seen and examined and  history reviewed. Agree with above findings and plan. Very pleasant 74 yo WF seen for evaluation of chest pain. Patient has history of remote stent of the LAD. Last cath 2011 showed no obstructive disease. Last Myoview in 2012 was OK. Yesterday she was drinking coffee and developed localized lower sternal chest pain associated with belching. States pain lasted throughout the day. Pain recurred this am when she got up to BR. Ecg is unchanged. troponins are all normal. Symptoms are more consistent with GERD. She has a history of GERD but states she learned to what not to eat and she hasn't required medication. She does have some intermittent swelling in legs and takes lasix prn. Some recent travel. Will check d-dimer. Check Echo. Start PPI. I am not inclined to recommend further ischemic work up at this time but will await above studies.   Karan Inclan [place], [place] 10/04/2015 9:16 AM

## 2015-10-04 NOTE — Telephone Encounter (Signed)
Rx signed and faxed to [facility]

## 2015-10-04 NOTE — Discharge Summary (Signed)
Physician Discharge Summary  [name] CHE:[id number] DOB: [birth year] DOA: 10/03/2015  PCP: Andrea Redwood, MD  Admit date: 10/03/2015 Discharge date: 10/04/2015  Time spent: < 30 minutes  Recommendations for Outpatient Follow-up:  1. Follow up with Dr. Brigitte Cannon in 2-3 weeks   Discharge Diagnoses:  Principal Problem:   Chest pain Active Problems:   Parkinson's disease (HCC)   OSA (obstructive sleep apnea)   Generalized anxiety disorder   Coronary artery disease   Hyperlipidemia LDL goal <70   DM type 2 goal A1C below 7.5  Discharge Condition: stable  Diet recommendation: heart healthy  Filed Weights   10/03/15 1113 10/03/15 1400 10/04/15 0500  Weight: 81.647 kg (180 lb) 81.874 kg (180 lb 8 oz) 81.239 kg (179 lb 1.6 oz)    History of present illness:  See H&P, Labs, Consult and Test reports for all details in brief, patient is a 74 yo F with CAD s/p MI and LAD stent in 2001 admitted with atypical chest pain.   Hospital Course:  Chest pain - Patient was admitted to telemetry with chest pain. Given cardiac history cardiology was consulted and evaluated patient. Her chest pain was rather atypical and seemed to be related to food, and was relieved by GI cocktail. Her chest pain resolved in the hospital, her cardiac enzymes have remained negative. She underwent a 2D echo which showed normal EF as below. For completeness a D dimer was checked and returned negative.   Parkinson's disease - cont Carbidopa-levodopa OSA - CPAP Generalized anxiety disorder - cont Ativan and Zoloft DM type 2 goal A1C below 7.5 - resume home medications  Procedures:  2D echo Study Conclusions - Left ventricle: The cavity size was normal. Systolic function wasnormal. The estimated ejection fraction was in the range of 60%to 65%. Wall motion was normal; there were no regional wallmotion abnormalities. There was an increased relative contribution of atrial contraction to ventricular  filling.Doppler parameters are consistent with abnormal left ventricular relaxation (grade 1 diastolic dysfunction). - Aortic valve: Trileaflet; mildly thickened, mildly calcifiedleaflets. There was mild regurgitation. - Mitral valve: There was trivial regurgitation. - Pulmonary arteries: PA peak pressure: 37 mm Hg (S).   Consultations:  Cardiology   Discharge Exam: Filed Vitals:   10/03/15 1400 10/03/15 2100 10/04/15 0500 10/04/15 1334  BP: 124/62 131/56 132/61 134/64  Cannon: 77 62 63   Temp: 98.1 F (36.7 C) 98.3 F (36.8 C) 98.2 F (36.8 C) 97.9 F (36.6 C)  TempSrc: Oral   Oral  Resp: 18 16 18 20   Height: 5\' 6"  (1.676 m)     Weight: 81.874 kg (180 lb 8 oz)  81.239 kg (179 lb 1.6 oz)   SpO2: 100% 98% 100% 100%   General: NAD Cardiovascular: RRR Respiratory: CTA biL  Discharge Instructions Activity:  As tolerated   Get Medicines reviewed and adjusted: Please take all your medications with you for your next visit with your Primary MD  Please request your Primary MD to go over all hospital tests and procedure/radiological results at the follow up, please ask your Primary MD to get all Hospital records sent to his/her office.  If you experience worsening of your admission symptoms, develop shortness of breath, life threatening emergency, suicidal or homicidal thoughts you must seek medical attention immediately by calling 911 or calling your MD immediately if symptoms less severe.  You must read complete instructions/literature along with all the possible adverse reactions/side effects for all the Medicines you take and that have been  prescribed to you. Take any new Medicines after you have completely understood and accpet all the possible adverse reactions/side effects.   Do not drive when taking Pain medications.   Do not take more than prescribed Pain, Sleep and Anxiety Medications  Special Instructions: If you have smoked or chewed Tobacco in the last 2 yrs  please stop smoking, stop any regular Alcohol and or any Recreational drug use.  Wear Seat belts while driving.  Please note  You were cared for by a hospitalist during your hospital stay. Once you are discharged, your primary care physician will handle any further medical issues. Please note that NO REFILLS for any discharge medications will be authorized once you are discharged, as it is imperative that you return to your primary care physician (or establish a relationship with a primary care physician if you do not have one) for your aftercare needs so that they can reassess your need for medications and monitor your lab values.    Medication List    TAKE these medications        Carbidopa-Levodopa ER 23.75-95 MG Cpcr  Commonly known as:  RYTARY  Take 4 capsules by mouth 3 (three) times daily.     cholecalciferol 1000 UNITS tablet  Commonly known as:  VITAMIN D  Take 1,000 Units by mouth daily.     gi cocktail Susp suspension  Take 30 mLs by mouth 3 (three) times daily as needed for indigestion. Shake well.     LORazepam 0.5 MG tablet  Commonly known as:  ATIVAN  Take 0.5 mg by mouth every 8 (eight) hours. 1 bid     metFORMIN 1000 MG tablet  Commonly known as:  GLUCOPHAGE  Take 1,000 mg by mouth 2 (two) times daily with a meal.     rivastigmine 4.6 mg/24hr  Commonly known as:  EXELON  Place 1 patch (4.6 mg total) onto the skin daily.     sertraline 50 MG tablet  Commonly known as:  ZOLOFT  Take 50 mg by mouth daily.           Follow-up Information    Follow up with Andrea Sine, MD On 11/02/2015.   Specialty:  Cardiology   Why:  @9 :00am   Contact information:   [address] [place] [address] [phone]       Follow up with Andrea Redwood, MD In 2 weeks.   Specialty:  Internal Medicine   Contact information:   [place] [address] [phone]       The results of significant diagnostics from this  hospitalization (including imaging, microbiology, ancillary and laboratory) are listed below for reference.    Significant Diagnostic Studies: Dg Chest 2 View  10/03/2015  CLINICAL DATA:  Chest pain beginning today. EXAM: CHEST - 2 VIEW COMPARISON:  Two-view chest x-ray 11/01/2011 FINDINGS: The heart size is normal. Atherosclerotic calcifications are present at the aorta. There is no edema or effusion to suggest failure. Lung volumes are low. The visualized soft tissues and bony thorax are unremarkable. IMPRESSION: 1. Low lung volumes. 2. Atherosclerosis. 3. No acute cardiopulmonary disease. Electronically Signed   By: San Morelle M.D.   On: 10/03/2015 13:17    Labs: Basic Metabolic Panel:  Recent Labs Lab 10/03/15 1143 10/03/15 1309 10/04/15 0045  NA 138  --  136  K 4.4  --  5.1  CL 106  --  103  CO2 20*  --  24  GLUCOSE 121*  --  107*  BUN 16  --  16  CREATININE 0.99 0.97 1.01*  CALCIUM 9.4  --  9.0   CBC:  Recent Labs Lab 10/03/15 1143 10/03/15 1309 10/04/15 0045  WBC 7.4 7.3 6.9  HGB 12.6 12.3 11.2*  HCT 37.7 38.6 35.1*  MCV 88.1 88.9 88.9  PLT 261 274 279   Cardiac Enzymes:  Recent Labs Lab 10/03/15 1309 10/03/15 1843 10/04/15 0045  TROPONINI <0.03 <0.03 <0.03   CBG:  Recent Labs Lab 10/03/15 1645 10/03/15 2124 10/04/15 0732 10/04/15 1130 10/04/15 1613  GLUCAP 136* 95 91 165* 122*       Signed:  Mery Guadalupe  [facility] 10/04/2015, 6:13 PM

## 2015-10-04 NOTE — Progress Notes (Signed)
  Echocardiogram 2D Echocardiogram has been performed.  [name] 10/04/2015, 4:16 PM

## 2015-10-11 DIAGNOSIS — L821 Other seborrheic keratosis: Secondary | ICD-10-CM | POA: Diagnosis not present

## 2015-10-11 DIAGNOSIS — D485 Neoplasm of uncertain behavior of skin: Secondary | ICD-10-CM | POA: Diagnosis not present

## 2015-10-11 DIAGNOSIS — L659 Nonscarring hair loss, unspecified: Secondary | ICD-10-CM | POA: Diagnosis not present

## 2015-10-11 DIAGNOSIS — L719 Rosacea, unspecified: Secondary | ICD-10-CM | POA: Diagnosis not present

## 2015-10-18 ENCOUNTER — Encounter: Payer: Self-pay | Admitting: Cardiovascular Disease

## 2015-10-29 DIAGNOSIS — I10 Essential (primary) hypertension: Secondary | ICD-10-CM | POA: Diagnosis not present

## 2015-10-29 DIAGNOSIS — L659 Nonscarring hair loss, unspecified: Secondary | ICD-10-CM | POA: Diagnosis not present

## 2015-10-29 DIAGNOSIS — E1140 Type 2 diabetes mellitus with diabetic neuropathy, unspecified: Secondary | ICD-10-CM | POA: Diagnosis not present

## 2015-10-29 DIAGNOSIS — E559 Vitamin D deficiency, unspecified: Secondary | ICD-10-CM | POA: Diagnosis not present

## 2015-10-29 DIAGNOSIS — G20 Parkinson's disease: Secondary | ICD-10-CM | POA: Diagnosis not present

## 2015-10-29 DIAGNOSIS — G3183 Dementia with Lewy bodies: Secondary | ICD-10-CM | POA: Diagnosis not present

## 2015-10-29 DIAGNOSIS — E785 Hyperlipidemia, unspecified: Secondary | ICD-10-CM | POA: Diagnosis not present

## 2015-10-29 DIAGNOSIS — Z23 Encounter for immunization: Secondary | ICD-10-CM | POA: Diagnosis not present

## 2015-10-29 DIAGNOSIS — Z6829 Body mass index (BMI) 29.0-29.9, adult: Secondary | ICD-10-CM | POA: Diagnosis not present

## 2015-10-29 DIAGNOSIS — Z9861 Coronary angioplasty status: Secondary | ICD-10-CM | POA: Diagnosis not present

## 2015-11-02 ENCOUNTER — Ambulatory Visit (INDEPENDENT_AMBULATORY_CARE_PROVIDER_SITE_OTHER): Payer: Medicare Other | Admitting: Cardiovascular Disease

## 2015-11-02 VITALS — BP 122/80 | HR 68 | Ht 66.0 in | Wt 184.7 lb

## 2015-11-02 DIAGNOSIS — E785 Hyperlipidemia, unspecified: Secondary | ICD-10-CM

## 2015-11-02 DIAGNOSIS — G20 Parkinson's disease: Secondary | ICD-10-CM | POA: Diagnosis not present

## 2015-11-02 DIAGNOSIS — F03A0 Unspecified dementia, mild, without behavioral disturbance, psychotic disturbance, mood disturbance, and anxiety: Secondary | ICD-10-CM

## 2015-11-02 DIAGNOSIS — I2583 Coronary atherosclerosis due to lipid rich plaque: Secondary | ICD-10-CM

## 2015-11-02 DIAGNOSIS — I2510 Atherosclerotic heart disease of native coronary artery without angina pectoris: Secondary | ICD-10-CM | POA: Diagnosis not present

## 2015-11-02 DIAGNOSIS — R079 Chest pain, unspecified: Secondary | ICD-10-CM

## 2015-11-02 DIAGNOSIS — F4323 Adjustment disorder with mixed anxiety and depressed mood: Secondary | ICD-10-CM

## 2015-11-02 DIAGNOSIS — F0390 Unspecified dementia without behavioral disturbance: Secondary | ICD-10-CM

## 2015-11-02 MED ORDER — EZETIMIBE 10 MG PO TABS: 10.0000 mg | ORAL_TABLET | Freq: Every day | ORAL | Status: DC

## 2015-11-02 NOTE — Patient Instructions (Signed)
Your physician has recommended you make the following change in your medication: start new prescription for zetia 10 mg. This has been sent to your mail order pharmacy.  Your physician recommends that you return for fasting lab work in: 2 months.  Your physician wants you to follow-up in: 6 months or sooner if needed. You will receive a reminder letter in the mail two months in advance. If you don't receive a letter, please call our office to schedule the follow-up appointment.  If you need a refill on your cardiac medications before your next appointment, please call your pharmacy.

## 2015-11-03 ENCOUNTER — Encounter: Payer: Self-pay | Admitting: Cardiovascular Disease

## 2015-11-03 DIAGNOSIS — F0390 Unspecified dementia without behavioral disturbance: Secondary | ICD-10-CM | POA: Insufficient documentation

## 2015-11-03 NOTE — Progress Notes (Signed)
Patient ID: [name], female   DOB: 1941/11/21, 74 y.o.   MRN: [id number]     HPI: [name], is a 74 y.o. female presents to the office for a one-year cardiology evaluation.  [name] has established CAD and in November 2001 underwent initial stenting of her proximal LAD. Catheterization in 2002 which showed a patent LAD stent with 40% narrowing proximal to the stent. Her last catheterization was in 2011 which showed a patent stent without significant other coronary artery disease. She had normal LV function. Her last echo Doppler study was in 2012 which showed an ejection fraction of 60-65%. She had mild LA dilatation. There is mild aortic insufficiency. She did have a nuclear perfusion study in November 2012 which showed normal perfusion.  She  has a history of hyperlipidemia, hypertension, and type 2 diabetes mellitus.  There also is a history of anxiety. She has developed significant Parkinson's  disease and is followed by Dr. [name].  Recently, she has been on long-acting carbidopa, levodopa, 23.75-95 mg CPCR.  She was changed from Effexor to Zoloft for her anxiety.  She is on an excellent patch for memory issues.  She has been on metformin 1000 mg twice a day for diabetes mellitus.  She also takes Ativan for anxiety.  Presently, she denies chest pain.  She denies palpitations.  She tells me Dr. Brigitte Pulse checked laboratory 2 months ago.  Remotely she had taken Zetia, but is no longer taking this.  She has been using a stationary bike 25-30 minutes at a time for Parkinson's disease and she was told that if she did not do this she would run the risk of not being able to walk.    Past Medical History  Diagnosis Date  . Coronary artery disease     Mid LAD Stent 2001 Patent By cath on 08/10/2010  . Diabetes mellitus     Type II  . Dyslipidemia   . HTN (hypertension)   . Parkinson disease ([place])   . Anxiety   . GERD (gastroesophageal reflux disease)     Past Surgical History  Procedure  Laterality Date  . Cholecystectomy  1963  . Appendectomy    . Hysterectomy  1989  . Transthoracic echocardiogram  2012    EF 60-65%, mild AV regurg, LA mildly dilated, PA peak pressure 37mmHg  . Nm myocar perf wall motion  2012    lexiscan - normal perfusion, EF 71%, low risk  . Cardiac catheterization  07/02/2000    normal LV function, 40-50% prox smooth LAD stenosis between 1st and 2nd diagonal, normal Cfx & RCA (Dr. Corky Downs)  . Coronary angioplasty with stent placement  10/31/2000    single vessel CAD (mild degree in mid-LAD 60% stenosis) - predilatation of plaque in mid LAD with cutting balloon angioplasty; 3.0x69mm Nir Elite stent to mid-LAD (Dr. Domenic Moras)  . Cardiac catheterization  02/20/2001    normal LV function, 50% stenosis prox to prox 3.5x36mm Nir Elite stent, 80% stenosis in a 1st septal perforating artery (Dr. Corky Downs)  . Cardiac catheterization  06/18/2006    patent LAD stent, dominant Cfx, low-normal EF (Dr. Domenic Moras)  . Cardiac catheterization  08/10/2010    patent mid LAD stent, nonobstructive CAD (Dr. Alma Friendly)    Allergies  Allergen Reactions  . Aspirin Anaphylaxis    swelling    Current Outpatient Prescriptions  Medication Sig Dispense Refill  . ALPRAZolam (XANAX) 0.5 MG tablet Take 0.5 mg by mouth at  bedtime as needed for anxiety.    . Carbidopa-Levodopa ER (RYTARY) 23.75-95 MG CPCR Take 4 tablets by mouth 3 (three) times daily.    . metFORMIN (GLUCOPHAGE) 1000 MG tablet Take 1,000 mg by mouth 2 (two) times daily with a meal.      . rivastigmine (EXELON) 4.6 mg/24hr Place 1 patch (4.6 mg total) onto the skin daily. 90 patch 1  . sertraline (ZOLOFT) 50 MG tablet Take 50 mg by mouth daily.    Marland Kitchen ezetimibe (ZETIA) 10 MG tablet Take 1 tablet (10 mg total) by mouth daily. 90 tablet 3   No current facility-administered medications for this visit.    Social History   Social History  . Marital Status: Widowed    Spouse Name: N/A  . Number of Children: 3  .  Years of Education: 12   Occupational History  . Retired    Social History Main Topics  . Smoking status: Never Smoker   . Smokeless tobacco: Never Used  . Alcohol Use: No  . Drug Use: No  . Sexual Activity: Not on file   Other Topics Concern  . Not on file   Social History Narrative   PT lives at home with her spouse.   Caffeine Use- 3 to 4 cups daily    Family History  Problem Relation Age of Onset  . Heart failure Father    Socially she lives alone. However her daughter is very close and checks on her daily. Her husband passed away with within the past month. She has 3 children 8 grandchildren.   ROS General: Negative; No fevers, chills, or night sweats;  HEENT: Negative; No changes in vision or hearing, sinus congestion, difficulty swallowing Pulmonary: Negative; No cough, wheezing, shortness of breath, hemoptysis Cardiovascular: Negative; No chest pain, presyncope, syncope, palpitations GI: Negative; No nausea, vomiting, diarrhea, or abdominal pain GU: Negative; No dysuria, hematuria, or difficulty voiding Musculoskeletal: Negative; no myalgias, joint pain, or weakness Hematologic/Oncology: Negative; no easy bruising, bleeding Endocrine: Negative; no heat/cold intolerance; no diabetes Neuro: Positive for Parkinson's disease with tremor and rigidity.; no changes in balance, headaches Skin: Negative; No rashes or skin lesions Psychiatric: Positive for anxiety.  Positive for dementia, mild Sleep: Negative; No snoring, daytime sleepiness, hypersomnolence, bruxism, restless legs, hypnogognic hallucinations, no cataplexy Other comprehensive 14 point system review is negative.   PE BP 122/80 mmHg  Pulse 68  Ht $R'5\' 6"'eG$  (1.676 m)  Wt 184 lb 11.2 oz (83.779 kg)  BMI 29.83 kg/m2   Wt Readings from Last 3 Encounters:  11/02/15 184 lb 11.2 oz (83.779 kg)  10/04/15 179 lb 1.6 oz (81.239 kg)  09/20/15 182 lb (82.555 kg)   General: Alert, oriented, no distress.  Skin:  normal turgor, no rashes HEENT: Normocephalic, atraumatic. Pupils round and reactive; sclera anicteric;no lid lag.  Nose without nasal septal hypertrophy Mouth/Parynx benign; Mallinpatti scale 3 Neck: No JVD, no carotid bruits with normal carotid Lungs: clear to ausculatation and percussion; no wheezing or rales Chest wall: Nontender to palpation Heart: RRR, s1 s2 normal 1/6 systolic murmur at the left sternal border, faint 1/6 diastolic murmur no S3 or S4 gallop. No rubs, thrills or heaves. Abdomen: Old cholecystomy scar. Suggestion of mild incisional hernia; soft, nontender; no hepatosplenomehaly, BS+; abdominal aorta nontender and not dilated by palpation. Pulses 2+ Extremities: Her legs are large, but there is not significant edema; no clubbing cyanosis or edema, Homan's sign negative  Neurologic: Positive for tremor.  Positive for mild cogwheel rigidity. Psychological:  Normal affect  ECG (independently read by me): Normal sinus rhythm at 68 bpm.  Q-wave in 3.  No significant ST segment changes  October 2015 ECG (independently read by me and (: Sinus bradycardia at 54 beats per minute. Q-wave in lead 3.  Normal intervals.  No significant ST changes  Prior September 2014  ECG: Sinus rhythm at 60 beats per minute. No ectopy  LABS:  I did review recent laboratory by Dr.Doug Brigitte Pulse  which was done on 06/22/2015. BUN 24, creatinine 0.9; normal LFTs.  Lipid panel: Cholesterol 244, TG 158, HDL 58, LDL 154 Hemoglobin A1c 6.4. Vitamin D 19.3 TSH 1.59.   BMP Latest Ref Rng 10/04/2015 10/03/2015 10/03/2015  Glucose 65 - 99 mg/dL 107(H) - 121(H)  BUN 6 - 20 mg/dL 16 - 16  Creatinine 0.44 - 1.00 mg/dL 1.01(H) 0.97 0.99  Sodium 135 - 145 mmol/L 136 - 138  Potassium 3.5 - 5.1 mmol/L 5.1 - 4.4  Chloride 101 - 111 mmol/L 103 - 106  CO2 22 - 32 mmol/L 24 - 20(L)  Calcium 8.9 - 10.3 mg/dL 9.0 - 9.4   Hepatic Function Latest Ref Rng 08/10/2010  Total Protein 6.0 - 8.3 g/dL 7.3  Albumin 3.5 -  5.2 g/dL 3.7  AST 0 - 37 U/L 23  ALT 0 - 35 U/L 14  Alk Phosphatase 39 - 117 U/L 91  Total Bilirubin 0.3 - 1.2 mg/dL 0.5  Bilirubin, Direct 0.0 - 0.3 mg/dL 0.2   CBC Latest Ref Rng 10/04/2015 10/03/2015 10/03/2015  WBC 4.0 - 10.5 K/uL 6.9 7.3 7.4  Hemoglobin 12.0 - 15.0 g/dL 11.2(L) 12.3 12.6  Hematocrit 36.0 - 46.0 % 35.1(L) 38.6 37.7  Platelets 150 - 400 K/uL 279 274 261   Lab Results  Component Value Date   MCV 88.9 10/04/2015   MCV 88.9 10/03/2015   MCV 88.1 10/03/2015   Lab Results  Component Value Date   TSH 3.446 08/10/2010   Lab Results  Component Value Date   HGBA1C 6.8* 11/01/2011   Lipid Panel     Component Value Date/Time   CHOL 208* 11/01/2011 0413   TRIG 158* 11/01/2011 0413   HDL 54 11/01/2011 0413   CHOLHDL 3.9 11/01/2011 0413   VLDL 32 11/01/2011 0413   LDLCALC 122* 11/01/2011 0413     RADIOLOGY: No results found.    ASSESSMENT AND PLAN: Ms. Derstine is a 74 years old and is 15 years status post stenting of her LAD at the ostium.  Her last cardiac catheterization was in 2011.  She is not having any anginal symptoms.  Her blood pressure today is controlled. .  She apparently is no longer taking Bystolic.  She denies recent palpitations.  I reviewed blood work from Dr. Brigitte Pulse which had been done this past summer.  With her significant recurrent increased LDL cholesterol  I have recommended resumption of Zetia 10 mg daily.  Her parkinsonian disease and rigidity, she is not on statin therapy.  She now is on long-acting carbidopa levodopa.  She continues to have parkinsonian tremors.  She continues to have mild rigidity but she feels her Parkinson disease has stabilized.  She also tells me she had a biopsy of her head.  Due to concerns for losing hair and was told that her problem was genetic.  With reinstitution of Zetia, repeat blood work will be obtained in 2 months.  I will see her in 6 months for reevaluation.   Time spent: 25 minutes  Erikka Follmer A.  Claiborne Billings, MD,  [facility]  11/03/2015 7:28 PM

## 2015-11-18 ENCOUNTER — Encounter: Payer: Self-pay | Admitting: Diagnostic Neuroimaging

## 2015-11-18 ENCOUNTER — Encounter: Payer: Self-pay | Admitting: Cardiovascular Disease

## 2015-11-19 ENCOUNTER — Telehealth: Payer: Self-pay | Admitting: *Deleted

## 2015-11-19 MED ORDER — CARBIDOPA-LEVODOPA ER 23.75-95 MG PO CPCR: 4.0000 | ORAL_CAPSULE | Freq: Three times a day (TID) | ORAL | Status: DC

## 2015-11-19 NOTE — Telephone Encounter (Signed)
Spoke with daughter re: wrong fax number. Resent fax to correct number, [phone].

## 2015-11-19 NOTE — Telephone Encounter (Signed)
Prescription printed, signed, faxed to [facility] [phone].

## 2015-11-29 DIAGNOSIS — H0015 Chalazion left lower eyelid: Secondary | ICD-10-CM | POA: Diagnosis not present

## 2015-12-21 ENCOUNTER — Ambulatory Visit: Payer: Medicare Other | Admitting: Diagnostic Neuroimaging

## 2015-12-22 ENCOUNTER — Encounter: Payer: Self-pay | Admitting: Diagnostic Neuroimaging

## 2015-12-27 ENCOUNTER — Encounter: Payer: Self-pay | Admitting: Diagnostic Neuroimaging

## 2015-12-27 ENCOUNTER — Ambulatory Visit (INDEPENDENT_AMBULATORY_CARE_PROVIDER_SITE_OTHER): Payer: Medicare Other | Admitting: Diagnostic Neuroimaging

## 2015-12-27 VITALS — BP 122/72 | HR 66 | Ht 66.0 in | Wt 182.0 lb

## 2015-12-27 DIAGNOSIS — G20 Parkinson's disease: Secondary | ICD-10-CM | POA: Diagnosis not present

## 2015-12-27 DIAGNOSIS — R441 Visual hallucinations: Secondary | ICD-10-CM | POA: Diagnosis not present

## 2015-12-27 MED ORDER — CARBIDOPA-LEVODOPA ER 23.75-95 MG PO CPCR: 3.0000 | ORAL_CAPSULE | Freq: Three times a day (TID) | ORAL | Status: DC

## 2015-12-27 NOTE — Patient Instructions (Addendum)
-   reduce rytary to 3 tabs three times per day - agree with plan to increase safety / supervision

## 2015-12-27 NOTE — Progress Notes (Signed)
[facility]  PATIENT: [name] DOB: [birth year]  REFERRING CLINICIAN:  HISTORY FROM: patient and daughter [name]) REASON FOR VISIT: follow up   HISTORICAL  CHIEF COMPLAINT:  Chief Complaint  Patient presents with  . Parkinson's disease    rm 7, dgtrAnderson Cannon, "balance issues continue, hallucinations worsening"  . Follow-up    3 month    HISTORY OF PRESENT ILLNESS:   UPDATE 12/27/15: Since last visit, more visual hallucinations (non-threatening). 1 close call fall into a chair.   UPDATE 09/20/15: Since last visit, more wearing off. Also having 1 week every 6 weeks of fatigue, malaise, tired feeling. Also noted a red scalp issue, new, and going to dermatology.   UPDATE 06/21/15: Pt reporting more fam hx of tremor (sister dx'd with PD recently; sister's son and his daughter dx'd with ET; 2 first cousins with ALS; patient's father had ET and PD). Doing well on rytary. Better tremor control. Better sleep. Pt and daughter planning to change living arrangement. Pt planning to sell her home and move in with daughter (after home renovations). Using cane at home. Had muscle spasms, better with heat pad and tizanidine. Now off muscle relaxer.   UPDATE 03/22/15: Since last visit, tried rytary with excellent results x 1 month; then cost was going to increase significantly. Now back to carb/levo 25/100 1.5/1.5/1.5/2 tabs. Better results over night, but not as good as with rytary. More right knee pain issues.  UPDATE 01/29/15: Since last visit patient having more problems with wearing off. She is taking her carbidopa/levodopa at 6 AM, noon, 4 PM and 7 PM. Patient feeling maximum wearing off symptoms mainly around 10 AM. She notes increasing tremor in arms, body, as well as internal tremulous feeling. Patient also having more problems with difficulty in urination, constipation, balance. Patient continues to live her on her own. Her daughter checks on her several times per  week. Patient still able to do light housework. Patient not driving or shopping on her own. Daughter helps with these functions.  UPDATE 07/20/14: Since last visit, doing much better. Now exercising daily (30 minutes) on a abike and with weights / bands. Also incr carb/levo is working better. Some occ wearing off, but pt can usually sense this and make small adjustments with timing / freq and this helps manage symptoms.   UPDATE 01/20/14: Since last visit, tried amantadine, but it gave her flu-like symptoms, confusion, bad side effects. She took x 1 week then stopped. Few months a ago, also began to have intermittent visual hallucinations ("amish people, dogs, animals, children"), mainly at night. These are non-threatening, and she usually goes to sleep after seeing them. She usually can tell that they are not real. Anxiety is stable. Depression is still present. More balance difficulties. More wearing off of carb/levo (usually after 4 hours). Still driving. Not using cane/walker. Is going to PT for exercises.  UPDATE 10/08/13: Since last visit, patient's husband passed away, and patient struggled with significant grieving and stress. She's finally come to terms and is doing better with mood and cognitive ability. Patient continues on carbidopa/levodopa, one and half tablets in the morning, one half tablets at noon, half tablet at 3 PM and 2 tablets in the evening. Patient is very attuned to her on off fluctuations and wearing off and is able to adjust her medications on daily basis to manage this. Her mood and anxiety have improved. She's taking Effexor 75 mg at bedtime and lorazepam 0.5 mg tabs, 2 tablets  at bedtime. This controls her symptoms quite well. Patient's daughter has noted that tremor is slightly increasing over time. Patient also feels spasms in her abdominal region.  UPDATE 03/25/13: since last visit patient's tremor, balance and gait have worsened. Her memory is slightly worse. She's also  becoming very obsessive compulsive. She has difficulty with going to the bathroom and may sit on the toilet for 1 or 2 hours at a time. She's become obsessed with the timing and scheduling going to the bathroom.  UPDATE 06/24/12: Now complaining of right leg pain (behind knee) severe pain since Sunday. Also intermittent since past 4-6 weeks. Started before starting exelon patch. Some cramps in her thighs. No back pain. Increased carb/levo is helping. Not tried clonazepam yet. Exelon patch seems to help her cognitive symptoms.   UPDATE 05/13/12: Cognitive problems, navigation abilities, perseveration and short term memory loss now more problematic.Seems to be progressing at rapid pace per family. Poor sleep at night. More anxiety at night. Carb/levo working, but tends to wear off before next dose. Now takes 4 tabs per day (with meals + 1 qhs). Yesterday had visual hallucination (saw a small boy outside near her husband).   UPDATE 01/10/12: Doing much better on carb/levo; tremor, gait, and mental abilities better.  No wearing off during day.  Sometimes wakes up in middle of night (2-3am) and has shaking, tremor, cannot fall asleep again.  Has snoring and daytime sleepiness.   PRIOR HPI (11/23/11): 75 year old right-handed female with history of diabetes, coronary artery disease, anxiety, care for hospital followup after possible transient ischemic attack. 10/31/11 - patient woke up, feeling confused, unable to organize and express her thoughts. Her daughter came to see her and was able to understand her, but the patient seemed frustrated and confused and unable to express what she was trying to say. At some point the patient developed some slurred speech and increased tremor. She was taken to the emergency room on 11/01/11 at 12:40am, and enrolled in the TIA protocol.  CT, MRI, carotid u/s and TTE done.  Labs done.  Patient discharged. For past 10 years, patient has had progressive postural tremor. Also has had  intermittent episodes of internal sensation of restlessness and anxiety.  She has been treated with primidone since August 2012, without relief of symptoms. She is also on Effexor and Xanax for anxiety and depression.  Patient's daughter has noted progressive memory loss, confusional episodes over the last several years.  Balance and walking deteriorating.   REVIEW OF SYSTEMS: Full 14 system review of systems performed and notable only as per HPI.   ALLERGIES: Allergies  Allergen Reactions  . Aspirin Anaphylaxis    swelling    HOME MEDICATIONS: Outpatient Prescriptions Prior to Visit  Medication Sig Dispense Refill  . ALPRAZolam (XANAX) 0.5 MG tablet Take 0.5 mg by mouth at bedtime as needed for anxiety.    . metFORMIN (GLUCOPHAGE) 1000 MG tablet Take 1,000 mg by mouth 2 (two) times daily with a meal.      . rivastigmine (EXELON) 4.6 mg/24hr Place 1 patch (4.6 mg total) onto the skin daily. 90 patch 1  . sertraline (ZOLOFT) 50 MG tablet Take 50 mg by mouth daily.    . Carbidopa-Levodopa ER (RYTARY) 23.75-95 MG CPCR Take 4 tablets by mouth 3 (three) times daily. 1080 capsule 0  . ezetimibe (ZETIA) 10 MG tablet Take 1 tablet (10 mg total) by mouth daily. (Patient not taking: Reported on 12/27/2015) 90 tablet 3   No facility-administered medications  prior to visit.    PAST MEDICAL HISTORY: Past Medical History  Diagnosis Date  . Coronary artery disease     Mid LAD Stent 2001 Patent By cath on 08/10/2010  . Diabetes mellitus     Type II  . Dyslipidemia   . HTN (hypertension)   . Parkinson disease ([place])   . Anxiety   . GERD (gastroesophageal reflux disease)     PAST SURGICAL HISTORY: Past Surgical History  Procedure Laterality Date  . Cholecystectomy  1963  . Appendectomy    . Hysterectomy  1989  . Transthoracic echocardiogram  2012    EF 60-65%, mild AV regurg, LA mildly dilated, PA peak pressure 72mmHg  . Nm myocar perf wall motion  2012    lexiscan - normal perfusion, EF  71%, low risk  . Cardiac catheterization  07/02/2000    normal LV function, 40-50% prox smooth LAD stenosis between 1st and 2nd diagonal, normal Cfx & RCA (Dr. Corky Downs)  . Coronary angioplasty with stent placement  10/31/2000    single vessel CAD (mild degree in mid-LAD 60% stenosis) - predilatation of plaque in mid LAD with cutting balloon angioplasty; 3.0x65mm Nir Elite stent to mid-LAD (Dr. Domenic Moras)  . Cardiac catheterization  02/20/2001    normal LV function, 50% stenosis prox to prox 3.5x25mm Nir Elite stent, 80% stenosis in a 1st septal perforating artery (Dr. Corky Downs)  . Cardiac catheterization  06/18/2006    patent LAD stent, dominant Cfx, low-normal EF (Dr. Domenic Moras)  . Cardiac catheterization  08/10/2010    patent mid LAD stent, nonobstructive CAD (Dr. Alma Friendly)    FAMILY HISTORY: Family History  Problem Relation Age of Onset  . Heart failure Father     SOCIAL HISTORY:  Social History   Social History  . Marital Status: Widowed    Spouse Name: N/A  . Number of Children: 3  . Years of Education: 12   Occupational History  . Retired    Social History Main Topics  . Smoking status: Never Smoker   . Smokeless tobacco: Never Used  . Alcohol Use: No  . Drug Use: No  . Sexual Activity: Not on file   Other Topics Concern  . Not on file   Social History Narrative   PT lives at home with her spouse.   Caffeine Use- 3 to 4 cups daily     PHYSICAL EXAM  Filed Vitals:   12/27/15 1323  BP: 122/72  Pulse: 66  Height: 5\' 6"  (1.676 m)  Weight: 182 lb (82.555 kg)   Body mass index is 29.39 kg/(m^2).  EXAM: General: Patient is awake, alert and in no acute distress.  Well developed and groomed. Cardiovascular: No carotid artery bruits.  Heart is regular rate and rhythm with no murmurs.  Neurologic Exam  Mental Status: Awake, alert. Language is fluent and comprehension intact. Cranial Nerves: Masked facial expression. Pupils are equal and reactive to light.   Visual fields are full to confrontation.  Conjugate eye movements are full, saccadic .  Ptosis on the left.  Facial sensation and strength are symmetric.  Hearing is intact to finger rub.  Palate elevated symmetrically and uvula is midline.  Shoulder shrug is symmetric.  Tongue is midline. Tremor at the soft pallate.  Motor: INTERMITTENT LUE > RUE MILD REST TREMOR. MILD POSTURAL TREMOR OF BUE.  MILD HEAD TREMOR.  MILD COGWHEELING WITH REINFORCEMENT.  MOD BRADYKINESIA IN LUE > RUE; RLE > LLE. Normal bulk. Full strength  in the upper and lower extremities.  No pronator drift. Sensory: Intact and symmetric to light touch. Coordination: No ataxia or dysmetria on finger-nose or rapid alternating movement testing. Gait and Station: SLOW TO STAND, WALK, TURN. LEFT HAND TREMOR; RIGHT HAND USING CANE.   DIAGNOSTIC DATA (LABS, IMAGING, TESTING) - I reviewed patient records, labs, notes, testing and imaging myself where available.  Lab Results  Component Value Date   WBC 6.9 10/04/2015   HGB 11.2* 10/04/2015   HCT 35.1* 10/04/2015   MCV 88.9 10/04/2015   PLT 279 10/04/2015      Component Value Date/Time   NA 136 10/04/2015 0045   K 5.1 10/04/2015 0045   CL 103 10/04/2015 0045   CO2 24 10/04/2015 0045   GLUCOSE 107* 10/04/2015 0045   BUN 16 10/04/2015 0045   CREATININE 1.01* 10/04/2015 0045   CALCIUM 9.0 10/04/2015 0045   PROT 7.3 08/10/2010 0149   ALBUMIN 3.7 08/10/2010 0149   AST 23 08/10/2010 0149   ALT 14 08/10/2010 0149   ALKPHOS 91 08/10/2010 0149   BILITOT 0.5 08/10/2010 0149   GFRNONAA 53* 10/04/2015 0045   GFRAA >60 10/04/2015 0045   Lab Results  Component Value Date   CHOL 208* 11/01/2011   HDL 54 11/01/2011   LDLCALC 122* 11/01/2011   TRIG 158* 11/01/2011   CHOLHDL 3.9 11/01/2011   Lab Results  Component Value Date   HGBA1C 6.8* 11/01/2011   No results found for: [id number] Lab Results  Component Value Date   TSH 3.446 08/10/2010    ASSESSMENT AND PLAN  75 y.o.  year old female  has a past medical history of Coronary artery disease; Diabetes mellitus; Dyslipidemia; HTN (hypertension); Parkinson disease ([place]); Anxiety; and GERD (gastroesophageal reflux disease). here with Parkinson's disease.   Had been doing much better with rytary with tremor control, but having more hallucinations. Still with balance issues and fall risk.   Also reports strong fam hx of essential tremor, PD, and ALS. Could represent a genetic/familial form of PD for patient.    PLAN: - reduce rytary (23.75/96) to 3 caps TID) - caution with balance, fall risk --> use walker - caution with living alone; agree with plan per daughter and pt to live together   Meds ordered this encounter  Medications  . Carbidopa-Levodopa ER (RYTARY) 23.75-95 MG CPCR    Sig: Take 3 tablets by mouth 3 (three) times daily.    Dispense:  540 capsule    Refill:  4   Return in about 4 months (around 04/25/2016).     Penni Bombard, MD [id number], [id number] PM Certified in Neurology, Neurophysiology and Neuroimaging  [facility] [address], [place], [place] [address] [phone]

## 2015-12-29 ENCOUNTER — Telehealth: Payer: Self-pay | Admitting: Diagnostic Neuroimaging

## 2015-12-29 NOTE — Telephone Encounter (Signed)
[employer] called Carbidopa-Levodopa ER (RYTARY) 23.75-95 MG CPCR, expiration date for Authorization is 02/04/16, too early to do new authorization, will need to wait until mid February to try again.

## 2015-12-29 NOTE — Telephone Encounter (Signed)
[employer] also advised that we can use the same form to re-submit for PA in mid February, no need to discard current form.

## 2016-01-07 DIAGNOSIS — H0015 Chalazion left lower eyelid: Secondary | ICD-10-CM | POA: Diagnosis not present

## 2016-02-01 ENCOUNTER — Telehealth: Payer: Self-pay | Admitting: Diagnostic Neuroimaging

## 2016-02-01 NOTE — Telephone Encounter (Signed)
[employer] called and has approved Carbidopa-Levodopa ER (RYTARY) 23.75-95 MG CPCR from 02/01/16 to 01/31/17. Letters to our office and pt will be sent out. Phone: [phone]

## 2016-02-01 NOTE — Addendum Note (Signed)
Addended by: Minna Antis on: 02/01/2016 03:37 PM   Modules accepted: Medications

## 2016-02-03 ENCOUNTER — Encounter: Payer: Self-pay | Admitting: *Deleted

## 2016-02-09 ENCOUNTER — Telehealth: Payer: Self-pay | Admitting: Diagnostic Neuroimaging

## 2016-02-09 NOTE — Telephone Encounter (Signed)
Blanch Media with [facility] [phone]  Fax# [phone] called requesting RX for Carbidopa-Levodopa ER (RYTARY) 23.75-95 MG CPCR. RX is out of refills

## 2016-02-09 NOTE — Telephone Encounter (Signed)
Spoke with [name] and informed her that pt received refill in Jan and [place] approved coverage thru 01/31/2017. She advised to call patient.  Spoke with patient who stated she received Rytary 3 month supply. She had to pay her insurance deductible. She stated her daughter is handling her medication, and she'll have her daughter call back tomorrow to clarify. She verbalized understanding for call.

## 2016-02-14 NOTE — Telephone Encounter (Signed)
Spoke w/daughter, [name] who stated her mother is to receive free med through [employer] pt assistance, but she needs new prescription.  Spoke with Malachy Mood, pharmacist, [facility] [phone] and gave exact refill that Dr [name] sent to [facility] 12/27/15. She repeated refill to verify.

## 2016-02-22 ENCOUNTER — Encounter: Payer: Self-pay | Admitting: *Deleted

## 2016-02-28 ENCOUNTER — Other Ambulatory Visit: Payer: Self-pay

## 2016-02-28 DIAGNOSIS — Z1231 Encounter for screening mammogram for malignant neoplasm of breast: Secondary | ICD-10-CM

## 2016-03-03 DIAGNOSIS — G20 Parkinson's disease: Secondary | ICD-10-CM | POA: Diagnosis not present

## 2016-03-03 DIAGNOSIS — I10 Essential (primary) hypertension: Secondary | ICD-10-CM | POA: Diagnosis not present

## 2016-03-03 DIAGNOSIS — E1140 Type 2 diabetes mellitus with diabetic neuropathy, unspecified: Secondary | ICD-10-CM | POA: Diagnosis not present

## 2016-03-03 DIAGNOSIS — E784 Other hyperlipidemia: Secondary | ICD-10-CM | POA: Diagnosis not present

## 2016-03-03 DIAGNOSIS — Z6830 Body mass index (BMI) 30.0-30.9, adult: Secondary | ICD-10-CM | POA: Diagnosis not present

## 2016-03-15 ENCOUNTER — Ambulatory Visit (INDEPENDENT_AMBULATORY_CARE_PROVIDER_SITE_OTHER): Payer: Medicare Other | Admitting: Diagnostic Neuroimaging

## 2016-03-15 ENCOUNTER — Encounter: Payer: Self-pay | Admitting: Diagnostic Neuroimaging

## 2016-03-15 VITALS — BP 119/71 | HR 79 | Ht 66.0 in | Wt 183.6 lb

## 2016-03-15 DIAGNOSIS — R6889 Other general symptoms and signs: Secondary | ICD-10-CM

## 2016-03-15 DIAGNOSIS — R5381 Other malaise: Secondary | ICD-10-CM

## 2016-03-15 DIAGNOSIS — R6883 Chills (without fever): Secondary | ICD-10-CM

## 2016-03-15 DIAGNOSIS — R42 Dizziness and giddiness: Secondary | ICD-10-CM

## 2016-03-15 DIAGNOSIS — G20 Parkinson's disease: Secondary | ICD-10-CM

## 2016-03-15 DIAGNOSIS — G20A1 Parkinson's disease without dyskinesia, without mention of fluctuations: Secondary | ICD-10-CM

## 2016-03-15 DIAGNOSIS — E538 Deficiency of other specified B group vitamins: Secondary | ICD-10-CM

## 2016-03-15 DIAGNOSIS — Z79899 Other long term (current) drug therapy: Secondary | ICD-10-CM | POA: Diagnosis not present

## 2016-03-15 DIAGNOSIS — N390 Urinary tract infection, site not specified: Secondary | ICD-10-CM

## 2016-03-15 NOTE — Patient Instructions (Signed)
Thank you for coming to see [place] at [facility]. I hope we have been able to provide you high quality care today.  You may receive a patient satisfaction survey over the next few weeks. We would appreciate your feedback and comments so that we may continue to improve ourselves and the health of our patients.  - I will check MRI and lab testing

## 2016-03-15 NOTE — Progress Notes (Signed)
[facility]  PATIENT: [name] DOB: [birth year]  REFERRING CLINICIAN:  HISTORY FROM: patient and daughter [name]) REASON FOR VISIT: follow up   HISTORICAL  CHIEF COMPLAINT:  Chief Complaint  Patient presents with  . Parkinson's disease    rm 6, dgtr- [name], "increased weakness over past 2 mos, began Rytary tid x 1 day  . hallucinations  . Follow-up    early FU requested    HISTORY OF PRESENT ILLNESS:   UPDATE 03/15/16: Since last visit, had progressive malaise, weakness, fatigue, chills, confusion, memory problems over past 1-2 months. In last few days, more severe worsening of generalized sxs. No infx signs. No focal weakness. PD stable, on rytary.  UPDATE 12/27/15: Since last visit, more visual hallucinations (non-threatening). 1 close call fall into a chair.   UPDATE 09/20/15: Since last visit, more wearing off. Also having 1 week every 6 weeks of fatigue, malaise, tired feeling. Also noted a red scalp issue, new, and going to dermatology.   UPDATE 06/21/15: Pt reporting more fam hx of tremor (sister dx'd with PD recently; sister's son and his daughter dx'd with ET; 2 first cousins with ALS; patient's father had ET and PD). Doing well on rytary. Better tremor control. Better sleep. Pt and daughter planning to change living arrangement. Pt planning to sell her home and move in with daughter (after home renovations). Using cane at home. Had muscle spasms, better with heat pad and tizanidine. Now off muscle relaxer.   UPDATE 03/22/15: Since last visit, tried rytary with excellent results x 1 month; then cost was going to increase significantly. Now back to carb/levo 25/100 1.5/1.5/1.5/2 tabs. Better results over night, but not as good as with rytary. More right knee pain issues.  UPDATE 01/29/15: Since last visit patient having more problems with wearing off. She is taking her carbidopa/levodopa at 6 AM, noon, 4 PM and 7 PM. Patient feeling maximum wearing  off symptoms mainly around 10 AM. She notes increasing tremor in arms, body, as well as internal tremulous feeling. Patient also having more problems with difficulty in urination, constipation, balance. Patient continues to live her on her own. Her daughter checks on her several times per week. Patient still able to do light housework. Patient not driving or shopping on her own. Daughter helps with these functions.  UPDATE 07/20/14: Since last visit, doing much better. Now exercising daily (30 minutes) on a abike and with weights / bands. Also incr carb/levo is working better. Some occ wearing off, but pt can usually sense this and make small adjustments with timing / freq and this helps manage symptoms.   UPDATE 01/20/14: Since last visit, tried amantadine, but it gave her flu-like symptoms, confusion, bad side effects. She took x 1 week then stopped. Few months a ago, also began to have intermittent visual hallucinations ("amish people, dogs, animals, children"), mainly at night. These are non-threatening, and she usually goes to sleep after seeing them. She usually can tell that they are not real. Anxiety is stable. Depression is still present. More balance difficulties. More wearing off of carb/levo (usually after 4 hours). Still driving. Not using cane/walker. Is going to PT for exercises.  UPDATE 10/08/13: Since last visit, patient's husband passed away, and patient struggled with significant grieving and stress. She's finally come to terms and is doing better with mood and cognitive ability. Patient continues on carbidopa/levodopa, one and half tablets in the morning, one half tablets at noon, half tablet at 3 PM and  2 tablets in the evening. Patient is very attuned to her on off fluctuations and wearing off and is able to adjust her medications on daily basis to manage this. Her mood and anxiety have improved. She's taking Effexor 75 mg at bedtime and lorazepam 0.5 mg tabs, 2 tablets at bedtime. This  controls her symptoms quite well. Patient's daughter has noted that tremor is slightly increasing over time. Patient also feels spasms in her abdominal region.  UPDATE 03/25/13: since last visit patient's tremor, balance and gait have worsened. Her memory is slightly worse. She's also becoming very obsessive compulsive. She has difficulty with going to the bathroom and may sit on the toilet for 1 or 2 hours at a time. She's become obsessed with the timing and scheduling going to the bathroom.  UPDATE 06/24/12: Now complaining of right leg pain (behind knee) severe pain since Sunday. Also intermittent since past 4-6 weeks. Started before starting exelon patch. Some cramps in her thighs. No back pain. Increased carb/levo is helping. Not tried clonazepam yet. Exelon patch seems to help her cognitive symptoms.   UPDATE 05/13/12: Cognitive problems, navigation abilities, perseveration and short term memory loss now more problematic.Seems to be progressing at rapid pace per family. Poor sleep at night. More anxiety at night. Carb/levo working, but tends to wear off before next dose. Now takes 4 tabs per day (with meals + 1 qhs). Yesterday had visual hallucination (saw a small boy outside near her husband).   UPDATE 01/10/12: Doing much better on carb/levo; tremor, gait, and mental abilities better.  No wearing off during day.  Sometimes wakes up in middle of night (2-3am) and has shaking, tremor, cannot fall asleep again.  Has snoring and daytime sleepiness.   PRIOR HPI (11/23/11): 75 year old right-handed female with history of diabetes, coronary artery disease, anxiety, care for hospital followup after possible transient ischemic attack. 10/31/11 - patient woke up, feeling confused, unable to organize and express her thoughts. Her daughter came to see her and was able to understand her, but the patient seemed frustrated and confused and unable to express what she was trying to say. At some point the patient  developed some slurred speech and increased tremor. She was taken to the emergency room on 11/01/11 at 12:40am, and enrolled in the TIA protocol.  CT, MRI, carotid u/s and TTE done.  Labs done.  Patient discharged. For past 10 years, patient has had progressive postural tremor. Also has had intermittent episodes of internal sensation of restlessness and anxiety.  She has been treated with primidone since August 2012, without relief of symptoms. She is also on Effexor and Xanax for anxiety and depression.  Patient's daughter has noted progressive memory loss, confusional episodes over the last several years.  Balance and walking deteriorating.   REVIEW OF SYSTEMS: Full 14 system review of systems performed and notable only as per HPI.   ALLERGIES: Allergies  Allergen Reactions  . Aspirin Anaphylaxis    swelling    HOME MEDICATIONS: Outpatient Prescriptions Prior to Visit  Medication Sig Dispense Refill  . ALPRAZolam (XANAX) 0.5 MG tablet Take 0.5 mg by mouth at bedtime as needed for anxiety.    . Carbidopa-Levodopa ER (RYTARY) 23.75-95 MG CPCR Take 3 tablets by mouth 3 (three) times daily. 540 capsule 4  . metFORMIN (GLUCOPHAGE) 1000 MG tablet Take 1,000 mg by mouth 2 (two) times daily with a meal.      . rivastigmine (EXELON) 4.6 mg/24hr Place 1 patch (4.6 mg total) onto the  skin daily. 90 patch 1  . sertraline (ZOLOFT) 50 MG tablet Take 50 mg by mouth daily.    Marland Kitchen ezetimibe (ZETIA) 10 MG tablet Take 1 tablet (10 mg total) by mouth daily. (Patient not taking: Reported on 12/27/2015) 90 tablet 3   No facility-administered medications prior to visit.    PAST MEDICAL HISTORY: Past Medical History  Diagnosis Date  . Coronary artery disease     Mid LAD Stent 2001 Patent By cath on 08/10/2010  . Diabetes mellitus     Type II  . Dyslipidemia   . HTN (hypertension)   . Parkinson disease ([place])   . Anxiety   . GERD (gastroesophageal reflux disease)     PAST SURGICAL HISTORY: Past Surgical  History  Procedure Laterality Date  . Cholecystectomy  1963  . Appendectomy    . Hysterectomy  1989  . Transthoracic echocardiogram  2012    EF 60-65%, mild AV regurg, LA mildly dilated, PA peak pressure 96mmHg  . Nm myocar perf wall motion  2012    lexiscan - normal perfusion, EF 71%, low risk  . Cardiac catheterization  07/02/2000    normal LV function, 40-50% prox smooth LAD stenosis between 1st and 2nd diagonal, normal Cfx & RCA (Dr. Corky Downs)  . Coronary angioplasty with stent placement  10/31/2000    single vessel CAD (mild degree in mid-LAD 60% stenosis) - predilatation of plaque in mid LAD with cutting balloon angioplasty; 3.0x62mm Nir Elite stent to mid-LAD (Dr. Domenic Moras)  . Cardiac catheterization  02/20/2001    normal LV function, 50% stenosis prox to prox 3.5x72mm Nir Elite stent, 80% stenosis in a 1st septal perforating artery (Dr. Corky Downs)  . Cardiac catheterization  06/18/2006    patent LAD stent, dominant Cfx, low-normal EF (Dr. Domenic Moras)  . Cardiac catheterization  08/10/2010    patent mid LAD stent, nonobstructive CAD (Dr. Alma Friendly)    FAMILY HISTORY: Family History  Problem Relation Age of Onset  . Heart failure Father     SOCIAL HISTORY:  Social History   Social History  . Marital Status: Widowed    Spouse Name: N/A  . Number of Children: 3  . Years of Education: 12   Occupational History  . Retired    Social History Main Topics  . Smoking status: Never Smoker   . Smokeless tobacco: Never Used  . Alcohol Use: No  . Drug Use: No  . Sexual Activity: Not on file   Other Topics Concern  . Not on file   Social History Narrative   PT lives alone   Caffeine Use- 3 to 4 cups daily     PHYSICAL EXAM  Filed Vitals:   03/15/16 1450  BP: 119/71  Pulse: 79  Height: 5\' 6"  (1.676 m)  Weight: 183 lb 9.6 oz (83.28 kg)   Wt Readings from Last 3 Encounters:  03/15/16 183 lb 9.6 oz (83.28 kg)  12/27/15 182 lb (82.555 kg)  11/02/15 184 lb 11.2 oz  (83.779 kg)   Body mass index is 29.65 kg/(m^2).  EXAM: General: Patient is awake, alert and in no acute distress.  Well developed and groomed. MALAISE APPEARANCE, WEAK APPEARING Cardiovascular: No carotid artery bruits.  Heart is regular rate and rhythm with no murmurs.  Neurologic Exam  Mental Status: Awake, alert. Language is fluent and comprehension intact. Cranial Nerves: Masked facial expression. Pupils are equal and reactive to light.  Visual fields are full to confrontation.  Conjugate eye movements  are full, saccadic .  Ptosis on the left.  Facial sensation and strength are symmetric.  Hearing is intact to finger rub.  Palate elevated symmetrically and uvula is midline.  Shoulder shrug is symmetric.  Tongue is midline. Tremor at the soft pallate.  Motor: INTERMITTENT LUE > RUE MILD REST TREMOR. MILD POSTURAL TREMOR OF BUE.  MILD HEAD TREMOR.  MILD COGWHEELING WITH REINFORCEMENT.  MOD BRADYKINESIA IN LUE > RUE; RLE > LLE. Normal bulk. Full strength in the upper and lower extremities.  No pronator drift. Sensory: Intact and symmetric to light touch. Coordination: No ataxia or dysmetria on finger-nose or rapid alternating movement testing. Gait and Station: SLOW TO STAND, WALK, TURN. LEFT HAND TREMOR; RIGHT HAND USING CANE.   DIAGNOSTIC DATA (LABS, IMAGING, TESTING) - I reviewed patient records, labs, notes, testing and imaging myself where available.  Lab Results  Component Value Date   WBC 6.9 10/04/2015   HGB 11.2* 10/04/2015   HCT 35.1* 10/04/2015   MCV 88.9 10/04/2015   PLT 279 10/04/2015      Component Value Date/Time   NA 136 10/04/2015 0045   K 5.1 10/04/2015 0045   CL 103 10/04/2015 0045   CO2 24 10/04/2015 0045   GLUCOSE 107* 10/04/2015 0045   BUN 16 10/04/2015 0045   CREATININE 1.01* 10/04/2015 0045   CALCIUM 9.0 10/04/2015 0045   PROT 7.3 08/10/2010 0149   ALBUMIN 3.7 08/10/2010 0149   AST 23 08/10/2010 0149   ALT 14 08/10/2010 0149   ALKPHOS 91 08/10/2010  0149   BILITOT 0.5 08/10/2010 0149   GFRNONAA 53* 10/04/2015 0045   GFRAA >60 10/04/2015 0045   Lab Results  Component Value Date   CHOL 208* 11/01/2011   HDL 54 11/01/2011   LDLCALC 122* 11/01/2011   TRIG 158* 11/01/2011   CHOLHDL 3.9 11/01/2011   Lab Results  Component Value Date   HGBA1C 6.8* 11/01/2011   No results found for: [id number] Lab Results  Component Value Date   TSH 3.446 08/10/2010    ASSESSMENT AND PLAN  75 y.o. year old female  has a past medical history of Coronary artery disease; Diabetes mellitus; Dyslipidemia; HTN (hypertension); Parkinson disease ([place]); Anxiety; and GERD (gastroesophageal reflux disease). here with Parkinson's disease.   Had been doing much better with rytary with tremor control, but having more hallucinations. Still with balance issues and fall risk.   Also reports strong fam hx of essential tremor, PD, and ALS. Could represent a genetic/familial form of PD for patient.   Now having more malaise, weakness, fatigue, memory issues since Feb 2017; esp since beginning April 2017. Could be viral, metabolic, progression of PD or some other cause. Will check add'l workup.   Dx:  Dizziness and giddiness - Plan: MR Brain Wo Contrast, CBC with Differential/Platelet, Comprehensive metabolic panel, Vitamin 123456, Hemoglobin A1c, TSH, UA/M w/rflx Culture, Routine  Malaise - Plan: MR Brain Wo Contrast, CBC with Differential/Platelet, Comprehensive metabolic panel, Vitamin 123456, Hemoglobin A1c, TSH, UA/M w/rflx Culture, Routine  Parkinson's disease ([place]) - Plan: MR Brain Wo Contrast, CBC with Differential/Platelet, Comprehensive metabolic panel, Vitamin 123456, Hemoglobin A1c, TSH, UA/M w/rflx Culture, Routine  Chills - Plan: MR Brain Wo Contrast, CBC with Differential/Platelet, Comprehensive metabolic panel, Vitamin 123456, Hemoglobin A1c, TSH, UA/M w/rflx Culture, Routine  B12 deficiency - Plan: MR Brain Wo Contrast, CBC with Differential/Platelet,  Comprehensive metabolic panel, Vitamin 123456, Hemoglobin A1c, TSH, UA/M w/rflx Culture, Routine  Other general symptoms and signs - Plan: MR Brain Wo  Contrast, CBC with Differential/Platelet, Comprehensive metabolic panel, Vitamin 123456, Hemoglobin A1c, TSH, UA/M w/rflx Culture, Routine  Long-term use of high-risk medication - Plan: MR Brain Wo Contrast, CBC with Differential/Platelet, Comprehensive metabolic panel, Vitamin 123456, Hemoglobin A1c, TSH, UA/M w/rflx Culture, Routine     PLAN: MALAISE, DIZZINESS, CONFUSION - MRI brain  - lab testing - PCP follow up  PARKINSON'S DZ - continue rytary (23.75/96) to 3-4 caps TID) - continue exelon patch - caution with balance, fall risk --> use walker - caution with living alone; agree with plan per daughter and pt to live together  Orders Placed This Encounter  Procedures  . MR Brain Wo Contrast  . CBC with Differential/Platelet  . Comprehensive metabolic panel  . Vitamin B12  . Hemoglobin A1c  . TSH  . UA/M w/rflx Culture, Routine   Return in about 1 month (around 04/14/2016).     Penni Bombard, MD [id number], [id number] PM Certified in Neurology, Neurophysiology and Neuroimaging  [facility] [address], [place], [place] [address] [phone]

## 2016-03-16 ENCOUNTER — Telehealth: Payer: Self-pay | Admitting: *Deleted

## 2016-03-16 LAB — COMPREHENSIVE METABOLIC PANEL WITH GFR
ALT: 6 IU/L (ref 0–32)
AST: 11 IU/L (ref 0–40)
Albumin/Globulin Ratio: 1.5 (ref 1.2–2.2)
Albumin: 4.3 g/dL (ref 3.5–4.8)
Alkaline Phosphatase: 102 IU/L (ref 39–117)
BUN/Creatinine Ratio: 18 (ref 12–28)
BUN: 19 mg/dL (ref 8–27)
Bilirubin Total: <0.2 mg/dL (ref 0.0–1.2)
CO2: 20 mmol/L (ref 18–29)
Calcium: 9.3 mg/dL (ref 8.7–10.3)
Chloride: 101 mmol/L (ref 96–106)
Creatinine, Ser: 1.07 mg/dL — ABNORMAL HIGH (ref 0.57–1.00)
GFR calc Af Amer: 58 mL/min/1.73 — ABNORMAL LOW (ref >59)
GFR calc non Af Amer: 51 mL/min/1.73 — ABNORMAL LOW (ref >59)
Globulin, Total: 2.9 g/dL (ref 1.5–4.5)
Glucose: 120 mg/dL — ABNORMAL HIGH (ref 65–99)
Potassium: 4.8 mmol/L (ref 3.5–5.2)
Sodium: 143 mmol/L (ref 134–144)
Total Protein: 7.2 g/dL (ref 6.0–8.5)

## 2016-03-16 LAB — HEMOGLOBIN A1C
Est. average glucose Bld gHb Est-mCnc: 154 mg/dL
Hgb A1c MFr Bld: 7 % — ABNORMAL HIGH (ref 4.8–5.6)

## 2016-03-16 LAB — CBC WITH DIFFERENTIAL/PLATELET
Basophils Absolute: 0 x10E3/uL (ref 0.0–0.2)
Basos: 0 %
EOS (ABSOLUTE): 0.1 x10E3/uL (ref 0.0–0.4)
Eos: 2 %
Hematocrit: 35.6 % (ref 34.0–46.6)
Hemoglobin: 11.7 g/dL (ref 11.1–15.9)
Immature Grans (Abs): 0 x10E3/uL (ref 0.0–0.1)
Immature Granulocytes: 0 %
Lymphocytes Absolute: 1.4 x10E3/uL (ref 0.7–3.1)
Lymphs: 18 %
MCH: 28.3 pg (ref 26.6–33.0)
MCHC: 32.9 g/dL (ref 31.5–35.7)
MCV: 86 fL (ref 79–97)
Monocytes Absolute: 0.7 x10E3/uL (ref 0.1–0.9)
Monocytes: 9 %
Neutrophils Absolute: 5.6 x10E3/uL (ref 1.4–7.0)
Neutrophils: 71 %
Platelets: 314 x10E3/uL (ref 150–379)
RBC: 4.13 x10E6/uL (ref 3.77–5.28)
RDW: 14.9 % (ref 12.3–15.4)
WBC: 7.8 x10E3/uL (ref 3.4–10.8)

## 2016-03-16 LAB — TSH: TSH: 3.1 u[IU]/mL (ref 0.450–4.500)

## 2016-03-16 LAB — VITAMIN B12: VITAMIN B 12: 231 pg/mL (ref 211–946)

## 2016-03-16 MED ORDER — SULFAMETHOXAZOLE-TRIMETHOPRIM 800-160 MG PO TABS: 1.0000 | ORAL_TABLET | Freq: Two times a day (BID) | ORAL | Status: DC

## 2016-03-16 NOTE — Addendum Note (Signed)
Addended byAndrey Spearman on: 03/16/2016 01:48 PM   Modules accepted: Orders

## 2016-03-16 NOTE — Telephone Encounter (Signed)
LVM for daughter informing her Dr [name] ordered antibiotic for her mother to treat UTI. Also informed her the other lab results are unremarkable. Spoke with patient who stated her daughter had called her and will pick up antibiotic for her to begin today. Advised she take all medication. Informed her of other lab results. Informed her that UA will be sent for culture, and if any other medication is ordered, she will receive a call. Sh verbalized understanding, appreciation.

## 2016-03-18 LAB — UA/M W/RFLX CULTURE, ROUTINE
Bilirubin, UA: NEGATIVE
Glucose, UA: NEGATIVE
Ketones, UA: NEGATIVE
NITRITE UA: POSITIVE — AB
PH UA: 6 (ref 5.0–7.5)
PROTEIN UA: NEGATIVE
Specific Gravity, UA: 1.012 (ref 1.005–1.030)
UUROB: 0.2 mg/dL (ref 0.2–1.0)

## 2016-03-18 LAB — MICROSCOPIC EXAMINATION: CASTS: NONE SEEN /LPF

## 2016-03-18 LAB — URINE CULTURE, REFLEX

## 2016-03-20 ENCOUNTER — Encounter: Payer: Self-pay | Admitting: Diagnostic Neuroimaging

## 2016-03-22 DIAGNOSIS — R350 Frequency of micturition: Secondary | ICD-10-CM | POA: Diagnosis not present

## 2016-03-22 DIAGNOSIS — Z6830 Body mass index (BMI) 30.0-30.9, adult: Secondary | ICD-10-CM | POA: Diagnosis not present

## 2016-03-22 DIAGNOSIS — R531 Weakness: Secondary | ICD-10-CM | POA: Diagnosis not present

## 2016-03-22 DIAGNOSIS — G20 Parkinson's disease: Secondary | ICD-10-CM | POA: Diagnosis not present

## 2016-03-23 DIAGNOSIS — R350 Frequency of micturition: Secondary | ICD-10-CM | POA: Diagnosis not present

## 2016-03-27 ENCOUNTER — Ambulatory Visit
Admission: RE | Admit: 2016-03-27 | Discharge: 2016-03-27 | Disposition: A | Discharge status: Home / Self Care | Payer: Medicare Other | Source: Ambulatory Visit

## 2016-03-27 DIAGNOSIS — Z1231 Encounter for screening mammogram for malignant neoplasm of breast: Secondary | ICD-10-CM

## 2016-05-01 ENCOUNTER — Encounter: Payer: Self-pay | Admitting: Diagnostic Neuroimaging

## 2016-05-01 ENCOUNTER — Ambulatory Visit (INDEPENDENT_AMBULATORY_CARE_PROVIDER_SITE_OTHER): Payer: Medicare Other | Admitting: Diagnostic Neuroimaging

## 2016-05-01 ENCOUNTER — Encounter: Payer: Self-pay | Admitting: *Deleted

## 2016-05-01 DIAGNOSIS — G20 Parkinson's disease: Secondary | ICD-10-CM

## 2016-05-01 DIAGNOSIS — R441 Visual hallucinations: Secondary | ICD-10-CM

## 2016-05-01 NOTE — Progress Notes (Signed)
[facility]  PATIENT: [name] DOB: 02-24-1941  REFERRING CLINICIAN:  HISTORY FROM: patient and daughter [name]) REASON FOR VISIT: follow up   HISTORICAL  CHIEF COMPLAINT:  Chief Complaint  Patient presents with  . Parkinson's disease, dizziness and giddiness    rm 7, dgtr- [name], "feel better; no change in walking/balance, I feel a difference oin my throat; more hallucinations-unable to convince her cat does not have worms even after vet visit"  . Follow-up    one month    HISTORY OF PRESENT ILLNESS:   UPDATE 05/01/16: Since last visit, had dx of UTI and s/p abx treatment. Hallucinations continue. Now with hallucination/delusion that her cat has worms or some infection. Family took patient and cat to the vet, and no worms/infx found.   UPDATE 03/15/16: Since last visit, had progressive malaise, weakness, fatigue, chills, confusion, memory problems over past 1-2 months. In last few days, more severe worsening of generalized sxs. No infx signs. No focal weakness. PD stable, on rytary.  UPDATE 12/27/15: Since last visit, more visual hallucinations (non-threatening). 1 close call fall into a chair.   UPDATE 09/20/15: Since last visit, more wearing off. Also having 1 week every 6 weeks of fatigue, malaise, tired feeling. Also noted a red scalp issue, new, and going to dermatology.   UPDATE 06/21/15: Pt reporting more fam hx of tremor (sister dx'd with PD recently; sister's son and his daughter dx'd with ET; 2 first cousins with ALS; patient's father had ET and PD). Doing well on rytary. Better tremor control. Better sleep. Pt and daughter planning to change living arrangement. Pt planning to sell her home and move in with daughter (after home renovations). Using cane at home. Had muscle spasms, better with heat pad and tizanidine. Now off muscle relaxer.   UPDATE 03/22/15: Since last visit, tried rytary with excellent results x 1 month; then cost was going to  increase significantly. Now back to carb/levo 25/100 1.5/1.5/1.5/2 tabs. Better results over night, but not as good as with rytary. More right knee pain issues.  UPDATE 01/29/15: Since last visit patient having more problems with wearing off. She is taking her carbidopa/levodopa at 6 AM, noon, 4 PM and 7 PM. Patient feeling maximum wearing off symptoms mainly around 10 AM. She notes increasing tremor in arms, body, as well as internal tremulous feeling. Patient also having more problems with difficulty in urination, constipation, balance. Patient continues to live her on her own. Her daughter checks on her several times per week. Patient still able to do light housework. Patient not driving or shopping on her own. Daughter helps with these functions.  UPDATE 07/20/14: Since last visit, doing much better. Now exercising daily (30 minutes) on a abike and with weights / bands. Also incr carb/levo is working better. Some occ wearing off, but pt can usually sense this and make small adjustments with timing / freq and this helps manage symptoms.   UPDATE 01/20/14: Since last visit, tried amantadine, but it gave her flu-like symptoms, confusion, bad side effects. She took x 1 week then stopped. Few months a ago, also began to have intermittent visual hallucinations ("amish people, dogs, animals, children"), mainly at night. These are non-threatening, and she usually goes to sleep after seeing them. She usually can tell that they are not real. Anxiety is stable. Depression is still present. More balance difficulties. More wearing off of carb/levo (usually after 4 hours). Still driving. Not using cane/walker. Is going to PT for exercises.  UPDATE 10/08/13: Since last visit, patient's husband passed away, and patient struggled with significant grieving and stress. She's finally come to terms and is doing better with mood and cognitive ability. Patient continues on carbidopa/levodopa, one and half tablets in the morning,  one half tablets at noon, half tablet at 3 PM and 2 tablets in the evening. Patient is very attuned to her on off fluctuations and wearing off and is able to adjust her medications on daily basis to manage this. Her mood and anxiety have improved. She's taking Effexor 75 mg at bedtime and lorazepam 0.5 mg tabs, 2 tablets at bedtime. This controls her symptoms quite well. Patient's daughter has noted that tremor is slightly increasing over time. Patient also feels spasms in her abdominal region.  UPDATE 03/25/13: since last visit patient's tremor, balance and gait have worsened. Her memory is slightly worse. She's also becoming very obsessive compulsive. She has difficulty with going to the bathroom and may sit on the toilet for 1 or 2 hours at a time. She's become obsessed with the timing and scheduling going to the bathroom.  UPDATE 06/24/12: Now complaining of right leg pain (behind knee) severe pain since Sunday. Also intermittent since past 4-6 weeks. Started before starting exelon patch. Some cramps in her thighs. No back pain. Increased carb/levo is helping. Not tried clonazepam yet. Exelon patch seems to help her cognitive symptoms.   UPDATE 05/13/12: Cognitive problems, navigation abilities, perseveration and short term memory loss now more problematic.Seems to be progressing at rapid pace per family. Poor sleep at night. More anxiety at night. Carb/levo working, but tends to wear off before next dose. Now takes 4 tabs per day (with meals + 1 qhs). Yesterday had visual hallucination (saw a small boy outside near her husband).   UPDATE 01/10/12: Doing much better on carb/levo; tremor, gait, and mental abilities better.  No wearing off during day.  Sometimes wakes up in middle of night (2-3am) and has shaking, tremor, cannot fall asleep again.  Has snoring and daytime sleepiness.   PRIOR HPI (11/23/11): 75 year old right-handed female with history of diabetes, coronary artery disease, anxiety, care for  hospital followup after possible transient ischemic attack. 10/31/11 - patient woke up, feeling confused, unable to organize and express her thoughts. Her daughter came to see her and was able to understand her, but the patient seemed frustrated and confused and unable to express what she was trying to say. At some point the patient developed some slurred speech and increased tremor. She was taken to the emergency room on 11/01/11 at 12:40am, and enrolled in the TIA protocol.  CT, MRI, carotid u/s and TTE done.  Labs done.  Patient discharged. For past 10 years, patient has had progressive postural tremor. Also has had intermittent episodes of internal sensation of restlessness and anxiety.  She has been treated with primidone since August 2012, without relief of symptoms. She is also on Effexor and Xanax for anxiety and depression.  Patient's daughter has noted progressive memory loss, confusional episodes over the last several years.  Balance and walking deteriorating.   REVIEW OF SYSTEMS: Full 14 system review of systems performed and notable only as per HPI.   ALLERGIES: Allergies  Allergen Reactions  . Aspirin Anaphylaxis    swelling    HOME MEDICATIONS: Outpatient Prescriptions Prior to Visit  Medication Sig Dispense Refill  . ALPRAZolam (XANAX) 0.5 MG tablet Take 0.5 mg by mouth at bedtime as needed for anxiety.    . Carbidopa-Levodopa ER (  RYTARY) 23.75-95 MG CPCR Take 3 tablets by mouth 3 (three) times daily. 540 capsule 4  . metFORMIN (GLUCOPHAGE) 1000 MG tablet Take 1,000 mg by mouth 2 (two) times daily with a meal.      . rivastigmine (EXELON) 4.6 mg/24hr Place 1 patch (4.6 mg total) onto the skin daily. 90 patch 1  . sertraline (ZOLOFT) 50 MG tablet Take 50 mg by mouth daily.    Marland Kitchen sulfamethoxazole-trimethoprim (BACTRIM DS,SEPTRA DS) 800-160 MG tablet Take 1 tablet by mouth 2 (two) times daily. 10 tablet 0   No facility-administered medications prior to visit.    PAST MEDICAL  HISTORY: Past Medical History  Diagnosis Date  . Coronary artery disease     Mid LAD Stent 2001 Patent By cath on 08/10/2010  . Diabetes mellitus     Type II  . Dyslipidemia   . HTN (hypertension)   . Parkinson disease ([place])   . Anxiety   . GERD (gastroesophageal reflux disease)     PAST SURGICAL HISTORY: Past Surgical History  Procedure Laterality Date  . Cholecystectomy  1963  . Appendectomy    . Hysterectomy  1989  . Transthoracic echocardiogram  2012    EF 60-65%, mild AV regurg, LA mildly dilated, PA peak pressure 50mmHg  . Nm myocar perf wall motion  2012    lexiscan - normal perfusion, EF 71%, low risk  . Cardiac catheterization  07/02/2000    normal LV function, 40-50% prox smooth LAD stenosis between 1st and 2nd diagonal, normal Cfx & RCA (Dr. Corky Downs)  . Coronary angioplasty with stent placement  10/31/2000    single vessel CAD (mild degree in mid-LAD 60% stenosis) - predilatation of plaque in mid LAD with cutting balloon angioplasty; 3.0x92mm Nir Elite stent to mid-LAD (Dr. Domenic Moras)  . Cardiac catheterization  02/20/2001    normal LV function, 50% stenosis prox to prox 3.5x84mm Nir Elite stent, 80% stenosis in a 1st septal perforating artery (Dr. Corky Downs)  . Cardiac catheterization  06/18/2006    patent LAD stent, dominant Cfx, low-normal EF (Dr. Domenic Moras)  . Cardiac catheterization  08/10/2010    patent mid LAD stent, nonobstructive CAD (Dr. Alma Friendly)    FAMILY HISTORY: Family History  Problem Relation Age of Onset  . Heart failure Father   . Parkinson's disease Father   . Stroke Mother   . Parkinson's disease Sister   . ALS Cousin     SOCIAL HISTORY:  Social History   Social History  . Marital Status: Widowed    Spouse Name: N/A  . Number of Children: 3  . Years of Education: 12   Occupational History  . Retired    Social History Main Topics  . Smoking status: Never Smoker   . Smokeless tobacco: Never Used  . Alcohol Use: No  . Drug Use: No    . Sexual Activity: Not on file   Other Topics Concern  . Not on file   Social History Narrative   PT lives alone   Caffeine Use- 3 to 4 cups daily     PHYSICAL EXAM  Filed Vitals:   05/01/16 0941  BP: 120/73  Pulse: 79  Height: 5\' 6"  (1.676 m)  Weight: 181 lb (82.101 kg)   Wt Readings from Last 3 Encounters:  05/01/16 181 lb (82.101 kg)  03/15/16 183 lb 9.6 oz (83.28 kg)  12/27/15 182 lb (82.555 kg)   Body mass index is 29.23 kg/(m^2).  EXAM: General: Patient is  awake, alert and in no acute distress.  Well developed and groomed. Cardiovascular: No carotid artery bruits.  Heart is regular rate and rhythm with no murmurs.  Neurologic Exam  Mental Status: Awake, alert. Language is fluent and comprehension intact. Cranial Nerves: Masked facial expression. Pupils are equal and reactive to light.  Visual fields are full to confrontation.  Conjugate eye movements are full, saccadic .  Ptosis on the left.  Facial sensation and strength are symmetric.  Hearing is intact to finger rub.  Palate elevated symmetrically and uvula is midline.  Shoulder shrug is symmetric.  Tongue is midline. Tremor at the soft pallate.  Motor: INTERMITTENT LUE > RUE MILD REST TREMOR. MILD POSTURAL TREMOR OF BUE.  MILD HEAD TREMOR.  MILD COGWHEELING WITH REINFORCEMENT.  MOD BRADYKINESIA IN LUE > RUE; RLE > LLE. Normal bulk. Full strength in the upper and lower extremities.  No pronator drift. Sensory: Intact and symmetric to light touch. Coordination: No ataxia or dysmetria on finger-nose or rapid alternating movement testing. Gait and Station: SLOW TO STAND, WALK, TURN. LEFT HAND TREMOR; RIGHT HAND USING CANE.   DIAGNOSTIC DATA (LABS, IMAGING, TESTING) - I reviewed patient records, labs, notes, testing and imaging myself where available.  Lab Results  Component Value Date   WBC 7.8 03/15/2016   HGB 11.2* 10/04/2015   HCT 35.6 03/15/2016   MCV 86 03/15/2016   PLT 314 03/15/2016      Component  Value Date/Time   NA 143 03/15/2016 1638   NA 136 10/04/2015 0045   K 4.8 03/15/2016 1638   CL 101 03/15/2016 1638   CO2 20 03/15/2016 1638   GLUCOSE 120* 03/15/2016 1638   GLUCOSE 107* 10/04/2015 0045   BUN 19 03/15/2016 1638   BUN 16 10/04/2015 0045   CREATININE 1.07* 03/15/2016 1638   CALCIUM 9.3 03/15/2016 1638   PROT 7.2 03/15/2016 1638   PROT 7.3 08/10/2010 0149   ALBUMIN 4.3 03/15/2016 1638   ALBUMIN 3.7 08/10/2010 0149   AST 11 03/15/2016 1638   ALT 6 03/15/2016 1638   ALKPHOS 102 03/15/2016 1638   BILITOT <0.2 03/15/2016 1638   BILITOT 0.5 08/10/2010 0149   GFRNONAA 51* 03/15/2016 1638   GFRAA 58* 03/15/2016 1638   Lab Results  Component Value Date   CHOL 208* 11/01/2011   HDL 54 11/01/2011   LDLCALC 122* 11/01/2011   TRIG 158* 11/01/2011   CHOLHDL 3.9 11/01/2011   Lab Results  Component Value Date   HGBA1C 7.0* 03/15/2016   Lab Results  Component Value Date   VITAMINB12 231 03/15/2016   Lab Results  Component Value Date   TSH 3.100 03/15/2016    ASSESSMENT AND PLAN  75 y.o. year old female  has a past medical history of Coronary artery disease; Diabetes mellitus; Dyslipidemia; HTN (hypertension); Parkinson disease ([place]); Anxiety; and GERD (gastroesophageal reflux disease). here with Parkinson's disease.   Had been doing much better with rytary with tremor control, but having more hallucinations. Still with balance issues and fall risk.   Also reports strong fam hx of essential tremor, PD, and ALS. Could represent a genetic/familial form of PD for patient.     Dx:  Parkinson's disease ([place])  Hallucinations, visual     PLAN:  PARKINSON'S DZ - continue rytary (23.75/96) to 3-4 caps TID) - continue exelon patch - caution with balance, fall risk --> use walker - caution with living alone; agree with plan per daughter and pt to live together - will start nuplazid  for parkinson's hallucinations  Return in about 3 months (around 08/01/2016).      Penni Bombard, MD [id number], [id number] AM Certified in Neurology, Neurophysiology and Neuroimaging  [facility] [address] [place], [place] [address] [phone]

## 2016-05-02 ENCOUNTER — Encounter: Payer: Self-pay | Admitting: Cardiovascular Disease

## 2016-05-02 ENCOUNTER — Ambulatory Visit (INDEPENDENT_AMBULATORY_CARE_PROVIDER_SITE_OTHER): Payer: Medicare Other | Admitting: Cardiovascular Disease

## 2016-05-02 VITALS — BP 116/64 | HR 77 | Ht 66.0 in | Wt 181.0 lb

## 2016-05-02 DIAGNOSIS — G20 Parkinson's disease: Secondary | ICD-10-CM | POA: Diagnosis not present

## 2016-05-02 DIAGNOSIS — I2510 Atherosclerotic heart disease of native coronary artery without angina pectoris: Secondary | ICD-10-CM

## 2016-05-02 DIAGNOSIS — I2583 Coronary atherosclerosis due to lipid rich plaque: Secondary | ICD-10-CM

## 2016-05-02 DIAGNOSIS — G20A1 Parkinson's disease without dyskinesia, without mention of fluctuations: Secondary | ICD-10-CM

## 2016-05-02 DIAGNOSIS — E119 Type 2 diabetes mellitus without complications: Secondary | ICD-10-CM

## 2016-05-02 DIAGNOSIS — E785 Hyperlipidemia, unspecified: Secondary | ICD-10-CM

## 2016-05-02 NOTE — Patient Instructions (Signed)
Your physician wants you to follow-up in: 1 year or sooner if needed. You will receive a reminder letter in the mail two months in advance. If you don't receive a letter, please call our office to schedule the follow-up appointment.   If you need a refill on your cardiac medications before your next appointment, please call your pharmacy.   

## 2016-05-02 NOTE — Progress Notes (Signed)
Patient ID: [name], female   DOB: 12/10/1941, 75 y.o.   MRN: [id number]     HPI: [name], is a 75 y.o. female presents to the office for a 7 month cardiology evaluation.  [name] has  CAD and in November 2001 underwent initial stenting of her proximal LAD. Catheterization in 2002 showed a patent LAD stent with 40% narrowing proximal to the stent. Her last catheterization was in 2011 which showed a patent stent without significant other coronary artery disease. She had normal LV function. Her last echo Doppler study was in 2012 which showed an ejection fraction of 60-65%. She had mild LA dilatation. There is mild aortic insufficiency.A xnuclear perfusion study in November 2012 which showed normal perfusion.  She  has a history of hyperlipidemia, hypertension, and type 2 diabetes mellitus.  There also is a history of anxiety. She has developed significant Parkinson's  disease and is followed by Dr. [name].  Recently, she has been on long-acting carbidopa, levodopa, 23.75-95 mg CPCR.  She was changed from Effexor to Zoloft for her anxiety.  She is on an exelon patch for memory issues.  She has had some issues with hallucinations from her Parkinson's disease and will be starting on nuplazid for Parkinson hallucinations.  She has been on metformin 1000 mg twice a day for diabetes mellitus.  She also takes Ativan for anxiety.  She has a history of hyperlipidemia.  When she was last seen she was given a prescription for Zetia but apparently she could not tolerate this as well.  In the past.  She has not tolerated statin therapy.  Past Medical History  Diagnosis Date  . Coronary artery disease     Mid LAD Stent 2001 Patent By cath on 08/10/2010  . Diabetes mellitus     Type II  . Dyslipidemia   . HTN (hypertension)   . Parkinson disease ([place])   . Anxiety   . GERD (gastroesophageal reflux disease)     Past Surgical History  Procedure Laterality Date  . Cholecystectomy  1963  .  Appendectomy    . Hysterectomy  1989  . Transthoracic echocardiogram  2012    EF 60-65%, mild AV regurg, LA mildly dilated, PA peak pressure 47mHg  . Nm myocar perf wall motion  2012    lexiscan - normal perfusion, EF 71%, low risk  . Cardiac catheterization  07/02/2000    normal LV function, 40-50% prox smooth LAD stenosis between 1st and 2nd diagonal, normal Cfx & RCA (Dr. TCorky Downs  . Coronary angioplasty with stent placement  10/31/2000    single vessel CAD (mild degree in mid-LAD 60% stenosis) - predilatation of plaque in mid LAD with cutting balloon angioplasty; 3.0x146mNir Elite stent to mid-LAD (Dr. W.Domenic Moras . Cardiac catheterization  02/20/2001    normal LV function, 50% stenosis prox to prox 3.5x1541mir Elite stent, 80% stenosis in a 1st septal perforating artery (Dr. T. Corky Downs. Cardiac catheterization  06/18/2006    patent LAD stent, dominant Cfx, low-normal EF (Dr. W. Domenic Moras. Cardiac catheterization  08/10/2010    patent mid LAD stent, nonobstructive CAD (Dr. K. Alma Friendly  Allergies  Allergen Reactions  . Aspirin Anaphylaxis    swelling    Current Outpatient Prescriptions  Medication Sig Dispense Refill  . ALPRAZolam (XANAX) 0.5 MG tablet Take 0.5 mg by mouth at bedtime as needed for anxiety.    . Carbidopa-Levodopa ER (RYTARY) 23.75-95 MG CPCR Take  3 tablets by mouth 3 (three) times daily. 540 capsule 4  . metFORMIN (GLUCOPHAGE) 1000 MG tablet Take 1,000 mg by mouth 2 (two) times daily with a meal.      . rivastigmine (EXELON) 4.6 mg/24hr Place 1 patch (4.6 mg total) onto the skin daily. 90 patch 1  . sertraline (ZOLOFT) 50 MG tablet Take 50 mg by mouth daily.     No current facility-administered medications for this visit.    Social History   Social History  . Marital Status: Widowed    Spouse Name: N/A  . Number of Children: 3  . Years of Education: 12   Occupational History  . Retired    Social History Main Topics  . Smoking status: Never Smoker     . Smokeless tobacco: Never Used  . Alcohol Use: No  . Drug Use: No  . Sexual Activity: Not on file   Other Topics Concern  . Not on file   Social History Narrative   PT lives alone   Caffeine Use- 3 to 4 cups daily    Family History  Problem Relation Age of Onset  . Heart failure Father   . Parkinson's disease Father   . Stroke Mother   . Parkinson's disease Sister   . ALS Cousin    Socially she Is widowed and lives alone. However her daughter is very close and checks on her daily. She has 3 children 8 grandchildren.   ROS General: Negative; No fevers, chills, or night sweats;  HEENT: Negative; No changes in vision or hearing, sinus congestion, difficulty swallowing Pulmonary: Negative; No cough, wheezing, shortness of breath, hemoptysis Cardiovascular: Negative; No chest pain, presyncope, syncope, palpitations GI: Negative; No nausea, vomiting, diarrhea, or abdominal pain GU: Negative; No dysuria, hematuria, or difficulty voiding Musculoskeletal: Negative; no myalgias, joint pain, or weakness Hematologic/Oncology: Negative; no easy bruising, bleeding Endocrine: Negative; no heat/cold intolerance; no diabetes Neuro: Positive for Parkinson's disease with tremor and rigidity.; no changes in balance, headaches Skin: Negative; No rashes or skin lesions Psychiatric: Positive for anxiety.  Positive for dementia, mild Sleep: Negative; No snoring, daytime sleepiness, hypersomnolence, bruxism, restless legs, hypnogognic hallucinations, no cataplexy Other comprehensive 14 point system review is negative.   PE BP 116/64 mmHg  Pulse 77  Ht '5\' 6"'$  (1.676 m)  Wt 181 lb (82.101 kg)  BMI 29.23 kg/m2   Wt Readings from Last 3 Encounters:  05/02/16 181 lb (82.101 kg)  05/01/16 181 lb (82.101 kg)  03/15/16 183 lb 9.6 oz (83.28 kg)   General: Alert, oriented, no distress.  Skin: normal turgor, no rashes HEENT: Normocephalic, atraumatic. Pupils round and reactive; sclera  anicteric;no lid lag.  Nose without nasal septal hypertrophy Mouth/Parynx benign; Mallinpatti scale 3 Neck: No JVD, no carotid bruits with normal carotid Lungs: clear to ausculatation and percussion; no wheezing or rales Chest wall: Nontender to palpation Heart: RRR, s1 s2 normal 1/6 systolic murmur at the left sternal border, faint 1/6 diastolic murmur no S3 or S4 gallop. No rubs, thrills or heaves. Abdomen: Old cholecystomy scar. Suggestion of mild incisional hernia; soft, nontender; no hepatosplenomehaly, BS+; abdominal aorta nontender and not dilated by palpation. Pulses 2+ Extremities: Her legs are large, but there is not significant edema; no clubbing cyanosis or edema, Homan's sign negative  Neurologic: Tremor and cogwheel rigidity has improved with her long-acting Rytary. Psychological: Normal affect  ECG (independently read by me): Normal sinus rhythm at 77 bpm.  Small nondiagnostic Q waves in 3 and aVF.  November 2016 ECG (independently read by me): Normal sinus rhythm at 68 bpm.  Q-wave in 3.  No significant ST segment changes  October 2015 ECG (independently read by me and (: Sinus bradycardia at 54 beats per minute. Q-wave in lead 3.  Normal intervals.  No significant ST changes  Prior September 2014  ECG: Sinus rhythm at 60 beats per minute. No ectopy  LABS:  Lboratory by Dr.Doug Brigitte Pulse on 06/22/2015. BUN 24, creatinine 0.9; normal LFTs.  Lipid panel: Cholesterol 244, TG 158, HDL 58, LDL 154 Hemoglobin A1c 6.4. Vitamin D 19.3 TSH 1.59.   BMP Latest Ref Rng 03/15/2016 10/04/2015 10/03/2015  Glucose 65 - 99 mg/dL 120(H) 107(H) -  BUN 8 - 27 mg/dL 19 16 -  Creatinine 0.57 - 1.00 mg/dL 1.07(H) 1.01(H) 0.97  BUN/Creat Ratio 12 - 28 18 - -  Sodium 134 - 144 mmol/L 143 136 -  Potassium 3.5 - 5.2 mmol/L 4.8 5.1 -  Chloride 96 - 106 mmol/L 101 103 -  CO2 18 - 29 mmol/L 20 24 -  Calcium 8.7 - 10.3 mg/dL 9.3 9.0 -   Hepatic Function Latest Ref Rng 03/15/2016 08/10/2010  Total  Protein 6.0 - 8.5 g/dL 7.2 7.3  Albumin 3.5 - 4.8 g/dL 4.3 3.7  AST 0 - 40 IU/L 11 23  ALT 0 - 32 IU/L 6 14  Alk Phosphatase 39 - 117 IU/L 102 91  Total Bilirubin 0.0 - 1.2 mg/dL <0.2 0.5  Bilirubin, Direct 0.0 - 0.3 mg/dL - 0.2   CBC Latest Ref Rng 03/15/2016 10/04/2015 10/03/2015  WBC 3.4 - 10.8 x10E3/uL 7.8 6.9 7.3  Hemoglobin 12.0 - 15.0 g/dL - 11.2(L) 12.3  Hematocrit 34.0 - 46.6 % 35.6 35.1(L) 38.6  Platelets 150 - 379 x10E3/uL 314 279 274   Lab Results  Component Value Date   MCV 86 03/15/2016   MCV 88.9 10/04/2015   MCV 88.9 10/03/2015   Lab Results  Component Value Date   TSH 3.100 03/15/2016   Lab Results  Component Value Date   HGBA1C 7.0* 03/15/2016   Lipid Panel     Component Value Date/Time   CHOL 208* 11/01/2011 0413   TRIG 158* 11/01/2011 0413   HDL 54 11/01/2011 0413   CHOLHDL 3.9 11/01/2011 0413   VLDL 32 11/01/2011 0413   LDLCALC 122* 11/01/2011 0413     RADIOLOGY: No results found.    ASSESSMENT AND PLAN: [name] is a 75 years old who is 16 years status post stenting of her LAD at the ostium.  Her last cardiac catheterization was in 2011.  She is not having any anginal symptoms.  Her blood pressure today is controlled.  She is no longer taking Bystolic.  Her Parkinson's seems to have stabilized and there is significantly less tremor and rigidity.  She has a history of hyperlipidemia and the past, could not tolerate statins.  She could not tolerate the recent initiation of Zetia.  I reviewed her recent blood work from one month ago.  She has stage III chronic kidney disease.  She is diabetic on metformin and recent hemoglobin A1c was 7.0.  She has had issues with hallucinations from her Parkinson's disease and will be started on a new medicine, nuplazid, per Dr. [name].  Her weight is stable with a body mass index of 29.23 after having lost significant weight in the past.  As long as she remains stable, I will see her one year for  reevaluation.  Time spent: 25 minutes  [name], MD, [facility]  05/02/2016 6:20 PM

## 2016-05-17 DIAGNOSIS — K642 Third degree hemorrhoids: Secondary | ICD-10-CM | POA: Diagnosis not present

## 2016-05-17 DIAGNOSIS — K625 Hemorrhage of anus and rectum: Secondary | ICD-10-CM | POA: Diagnosis not present

## 2016-05-17 DIAGNOSIS — K5900 Constipation, unspecified: Secondary | ICD-10-CM | POA: Diagnosis not present

## 2016-05-27 DIAGNOSIS — B353 Tinea pedis: Secondary | ICD-10-CM | POA: Diagnosis not present

## 2016-05-31 DIAGNOSIS — K623 Rectal prolapse: Secondary | ICD-10-CM | POA: Diagnosis not present

## 2016-05-31 DIAGNOSIS — K625 Hemorrhage of anus and rectum: Secondary | ICD-10-CM | POA: Diagnosis not present

## 2016-05-31 DIAGNOSIS — K642 Third degree hemorrhoids: Secondary | ICD-10-CM | POA: Diagnosis not present

## 2016-06-08 ENCOUNTER — Telehealth: Payer: Self-pay | Admitting: *Deleted

## 2016-06-08 NOTE — Telephone Encounter (Signed)
LVM for daughter, [name] requesting a call back re: Nuplazid prescription for her mother.  Prescribed by Dr [name] at her last office visit. Left name, number for return call.

## 2016-06-14 NOTE — Telephone Encounter (Signed)
LVM for daughter again, requesting call back to establish if her mother, the patient is taking Nuplazid, and if so, is it helpful. Requested she call back or reply through My Chart if registered. Left name, number.

## 2016-06-16 DIAGNOSIS — R194 Change in bowel habit: Secondary | ICD-10-CM | POA: Diagnosis not present

## 2016-06-16 DIAGNOSIS — B353 Tinea pedis: Secondary | ICD-10-CM | POA: Diagnosis not present

## 2016-06-16 DIAGNOSIS — G3183 Dementia with Lewy bodies: Secondary | ICD-10-CM | POA: Diagnosis not present

## 2016-06-16 DIAGNOSIS — E1140 Type 2 diabetes mellitus with diabetic neuropathy, unspecified: Secondary | ICD-10-CM | POA: Diagnosis not present

## 2016-06-16 DIAGNOSIS — Z6829 Body mass index (BMI) 29.0-29.9, adult: Secondary | ICD-10-CM | POA: Diagnosis not present

## 2016-07-04 ENCOUNTER — Telehealth: Payer: Self-pay | Admitting: *Deleted

## 2016-07-04 NOTE — Telephone Encounter (Signed)
Received call from daughter, [name] who apologized for not returning any of this RN's call earlier re: Nuplazid. She stated her mother never started the medication , and now her hallucinations are worse. She is asking what to do. Advised her mother begin the medication today, this RN will discuss with Dr [name] when he returns to office later this week. Advised that it may take a week or more before she sees improvement in her mother's hallucinations. Reminded her of FU 8/25/817, and told [name] that appt can be moved sooner if Dr [name] feels it would be beneficial.  She stated she will call abck at the end of this week with update, verbalized understanding, appreciation of call.

## 2016-07-06 ENCOUNTER — Encounter: Payer: Self-pay | Admitting: Diagnostic Neuroimaging

## 2016-07-07 ENCOUNTER — Emergency Department (HOSPITAL_COMMUNITY): Payer: Medicare Other

## 2016-07-07 ENCOUNTER — Encounter (HOSPITAL_COMMUNITY): Payer: Self-pay | Admitting: *Deleted

## 2016-07-07 ENCOUNTER — Emergency Department (HOSPITAL_COMMUNITY)
Admission: EM | Admit: 2016-07-07 | Discharge: 2016-07-09 | Payer: Medicare Other | Attending: Emergency Medicine | Admitting: Emergency Medicine

## 2016-07-07 DIAGNOSIS — G20 Parkinson's disease: Secondary | ICD-10-CM | POA: Diagnosis not present

## 2016-07-07 DIAGNOSIS — Z7984 Long term (current) use of oral hypoglycemic drugs: Secondary | ICD-10-CM | POA: Diagnosis not present

## 2016-07-07 DIAGNOSIS — R45851 Suicidal ideations: Secondary | ICD-10-CM | POA: Diagnosis not present

## 2016-07-07 DIAGNOSIS — I2510 Atherosclerotic heart disease of native coronary artery without angina pectoris: Secondary | ICD-10-CM | POA: Insufficient documentation

## 2016-07-07 DIAGNOSIS — N390 Urinary tract infection, site not specified: Secondary | ICD-10-CM | POA: Insufficient documentation

## 2016-07-07 DIAGNOSIS — I10 Essential (primary) hypertension: Secondary | ICD-10-CM | POA: Insufficient documentation

## 2016-07-07 DIAGNOSIS — Z79899 Other long term (current) drug therapy: Secondary | ICD-10-CM | POA: Insufficient documentation

## 2016-07-07 DIAGNOSIS — E119 Type 2 diabetes mellitus without complications: Secondary | ICD-10-CM | POA: Insufficient documentation

## 2016-07-07 DIAGNOSIS — R4182 Altered mental status, unspecified: Secondary | ICD-10-CM | POA: Diagnosis not present

## 2016-07-07 DIAGNOSIS — Z955 Presence of coronary angioplasty implant and graft: Secondary | ICD-10-CM | POA: Diagnosis not present

## 2016-07-07 DIAGNOSIS — R443 Hallucinations, unspecified: Secondary | ICD-10-CM | POA: Insufficient documentation

## 2016-07-07 HISTORY — DX: Dementia in other diseases classified elsewhere, unspecified severity, without behavioral disturbance, psychotic disturbance, mood disturbance, and anxiety: F02.80

## 2016-07-07 HISTORY — DX: Parkinson's disease: G20

## 2016-07-07 HISTORY — DX: Parkinson's disease without dyskinesia, without mention of fluctuations: G20.A1

## 2016-07-07 LAB — URINALYSIS, ROUTINE W REFLEX MICROSCOPIC
BILIRUBIN URINE: NEGATIVE
Glucose, UA: NEGATIVE mg/dL
KETONES UR: NEGATIVE mg/dL
Nitrite: POSITIVE — AB
PROTEIN: NEGATIVE mg/dL
Specific Gravity, Urine: 1.016 (ref 1.005–1.030)
pH: 5.5 (ref 5.0–8.0)

## 2016-07-07 LAB — CBC WITH DIFFERENTIAL/PLATELET
BASOS ABS: 0 10*3/uL (ref 0.0–0.1)
Basophils Relative: 0 %
Eosinophils Absolute: 0.1 10*3/uL (ref 0.0–0.7)
Eosinophils Relative: 1 %
HEMATOCRIT: 34.4 % — AB (ref 36.0–46.0)
HEMOGLOBIN: 11.1 g/dL — AB (ref 12.0–15.0)
LYMPHS PCT: 15 %
Lymphs Abs: 1.1 10*3/uL (ref 0.7–4.0)
MCH: 28.5 pg (ref 26.0–34.0)
MCHC: 32.3 g/dL (ref 30.0–36.0)
MCV: 88.2 fL (ref 78.0–100.0)
MONO ABS: 0.7 10*3/uL (ref 0.1–1.0)
MONOS PCT: 10 %
NEUTROS ABS: 5.3 10*3/uL (ref 1.7–7.7)
Neutrophils Relative %: 74 %
Platelets: 288 10*3/uL (ref 150–400)
RBC: 3.9 MIL/uL (ref 3.87–5.11)
RDW: 13.5 % (ref 11.5–15.5)
WBC: 7.1 10*3/uL (ref 4.0–10.5)

## 2016-07-07 LAB — CBG MONITORING, ED: Glucose-Capillary: 128 mg/dL — ABNORMAL HIGH (ref 65–99)

## 2016-07-07 LAB — URINE MICROSCOPIC-ADD ON

## 2016-07-07 LAB — BASIC METABOLIC PANEL
ANION GAP: 7 (ref 5–15)
BUN: 20 mg/dL (ref 6–20)
CO2: 24 mmol/L (ref 22–32)
Calcium: 9.3 mg/dL (ref 8.9–10.3)
Chloride: 108 mmol/L (ref 101–111)
Creatinine, Ser: 1.01 mg/dL — ABNORMAL HIGH (ref 0.44–1.00)
GFR calc Af Amer: >60 mL/min (ref >60)
GFR, EST NON AFRICAN AMERICAN: 52 mL/min — AB (ref >60)
GLUCOSE: 134 mg/dL — AB (ref 65–99)
POTASSIUM: 4.3 mmol/L (ref 3.5–5.1)
Sodium: 139 mmol/L (ref 135–145)

## 2016-07-07 LAB — ACETAMINOPHEN LEVEL: Acetaminophen (Tylenol), Serum: <10 ug/mL — ABNORMAL LOW (ref 10–30)

## 2016-07-07 LAB — SALICYLATE LEVEL: Salicylate Lvl: <4 mg/dL (ref 2.8–30.0)

## 2016-07-07 LAB — ETHANOL: Alcohol, Ethyl (B): <5 mg/dL (ref <5)

## 2016-07-07 MED ORDER — LORAZEPAM 1 MG PO TABS: 0.0000 mg | ORAL_TABLET | Freq: Four times a day (QID) | ORAL | Status: DC

## 2016-07-07 MED ORDER — QUETIAPINE FUMARATE 25 MG PO TABS
25.0000 mg | ORAL_TABLET | Freq: Every day | ORAL | 6 refills | Status: DC | PRN
Start: 2016-07-07 — End: 2017-05-14

## 2016-07-07 MED ORDER — CEFTRIAXONE SODIUM 1 G IJ SOLR
1.0000 g | Freq: Once | INTRAMUSCULAR | Status: AC
  Administered 2016-07-07: 1 g via INTRAVENOUS
  Filled 2016-07-07: qty 10

## 2016-07-07 MED ORDER — METFORMIN HCL 500 MG PO TABS
1000.0000 mg | ORAL_TABLET | Freq: Two times a day (BID) | ORAL | Status: DC
  Administered 2016-07-07 – 2016-07-09 (×4): 1000 mg via ORAL
  Filled 2016-07-07 (×4): qty 2

## 2016-07-07 MED ORDER — LORAZEPAM 1 MG PO TABS: 0.0000 mg | ORAL_TABLET | Freq: Two times a day (BID) | ORAL | Status: DC

## 2016-07-07 MED ORDER — CARBIDOPA-LEVODOPA ER 23.75-95 MG PO CPCR
3.0000 | ORAL_CAPSULE | Freq: Three times a day (TID) | ORAL | Status: DC
  Filled 2016-07-07: qty 3

## 2016-07-07 MED ORDER — CEPHALEXIN 250 MG PO CAPS
500.0000 mg | ORAL_CAPSULE | Freq: Three times a day (TID) | ORAL | Status: DC
  Administered 2016-07-08 – 2016-07-09 (×4): 500 mg via ORAL
  Filled 2016-07-07 (×4): qty 2

## 2016-07-07 MED ORDER — CARBIDOPA-LEVODOPA ER 23.75-95 MG PO CPCR: 3.0000 | ORAL_CAPSULE | Freq: Three times a day (TID) | ORAL | Status: DC

## 2016-07-07 MED ORDER — SERTRALINE HCL 50 MG PO TABS
50.0000 mg | ORAL_TABLET | Freq: Every day | ORAL | Status: DC
  Administered 2016-07-07 – 2016-07-09 (×3): 50 mg via ORAL
  Filled 2016-07-07 (×3): qty 1

## 2016-07-07 MED ORDER — RIVASTIGMINE 4.6 MG/24HR TD PT24
4.6000 mg | MEDICATED_PATCH | Freq: Every day | TRANSDERMAL | Status: DC
  Administered 2016-07-08: 4.6 mg via TRANSDERMAL
  Filled 2016-07-07 (×4): qty 1

## 2016-07-07 MED ORDER — CARBIDOPA-LEVODOPA ER 23.75-95 MG PO CPCR
4.0000 | ORAL_CAPSULE | Freq: Three times a day (TID) | ORAL | Status: DC
  Administered 2016-07-07 – 2016-07-08 (×3): 4 via ORAL
  Filled 2016-07-07: qty 3

## 2016-07-07 MED ORDER — CEPHALEXIN 250 MG PO CAPS: 500.0000 mg | ORAL_CAPSULE | Freq: Three times a day (TID) | ORAL | Status: DC

## 2016-07-07 NOTE — ED Notes (Signed)
Pt ambulated to restroom from room, pt tolerated well. 

## 2016-07-07 NOTE — ED Notes (Signed)
Wheeled pt to and from restroom.

## 2016-07-07 NOTE — ED Notes (Signed)
Ben PA at bedside. 

## 2016-07-07 NOTE — BH Assessment (Addendum)
Tele Assessment Note   [name] is a 75 y.o. female who presents to [facility] voluntarily, accompanied by her daughter and POA, [name]. Pt presented as oriented x 4, although she has a PMHx of dementia in Parkinson's disease. PT endorsed seeing worms in her stool, urine and on her skin for the past couple of weeks. Pt also endorsed seeing and feeling worms coming out of her eyes. Pt denied active SI, but endorsed passive SI, indicating that she was very tired and weak.   Pt's daughter shared that, prior to pt having hallucinations about worms on her, she had been having VH of worms on her cat. Daughter indicated that they took the cat to the vet twice and there were no worms found on the cat, but pt was not able to accept this as reality. Daughter reported that pt was taking tweezers and pulling the cat's hairs out trying to get the worms out. Daughter added that she is concerned that, now that she is having hallucinations of the worms on her, that pt may try to harm herself, trying to get the worms out/off of her. Daughter also shared that, last night, pt called her and advised her that she "just want to end it all", as she has a belief that the worms are going to get in her brain and kill her.   NOTE: Pt is followed by Dr. [name] for neurology. These symptoms had been reported to him and he recommended that pt start on Nuplazid. After researching this drug, pt's daughter declined to give to pt and Seroquel was recommended by Dr. [name] a couple of days ago. Pt has not yet started on this, and daughter was concerned after pt's admissions of wanting to end it all that she just bought her in to the ED.    Diagnosis: Schizoaffective disorder, Depressive type  Past Medical History:  Past Medical History:  Diagnosis Date  . Anxiety   . Coronary artery disease    Mid LAD Stent 2001 Patent By cath on 08/10/2010  . Dementia in Parkinson's disease ([place])   . Diabetes mellitus    Type II  .  Dyslipidemia   . GERD (gastroesophageal reflux disease)   . HTN (hypertension)   . Parkinson disease [facility])     Past Surgical History:  Procedure Laterality Date  . APPENDECTOMY    . CARDIAC CATHETERIZATION  07/02/2000   normal LV function, 40-50% prox smooth LAD stenosis between 1st and 2nd diagonal, normal Cfx & RCA (Dr. Corky Downs)  . CARDIAC CATHETERIZATION  02/20/2001   normal LV function, 50% stenosis prox to prox 3.5x54mm Nir Elite stent, 80% stenosis in a 1st septal perforating artery (Dr. Corky Downs)  . CARDIAC CATHETERIZATION  06/18/2006   patent LAD stent, dominant Cfx, low-normal EF (Dr. Domenic Moras)  . CARDIAC CATHETERIZATION  08/10/2010   patent mid LAD stent, nonobstructive CAD (Dr. Alma Friendly)  . CHOLECYSTECTOMY  1963  . CORONARY ANGIOPLASTY WITH STENT PLACEMENT  10/31/2000   single vessel CAD (mild degree in mid-LAD 60% stenosis) - predilatation of plaque in mid LAD with cutting balloon angioplasty; 3.0x27mm Nir Elite stent to mid-LAD (Dr. Domenic Moras)  . HYSTERECTOMY  1989  . NM MYOCAR PERF WALL MOTION  2012   lexiscan - normal perfusion, EF 71%, low risk  . TRANSTHORACIC ECHOCARDIOGRAM  2012   EF 60-65%, mild AV regurg, LA mildly dilated, PA peak pressure 4mmHg    Family History:  Family History  Problem Relation  Age of Onset  . Heart failure Father   . Parkinson's disease Father   . Stroke Mother   . Parkinson's disease Sister   . ALS Cousin     Social History:  reports that she has never smoked. She has never used smokeless tobacco. She reports that she does not drink alcohol or use drugs.  Additional Social History:  Alcohol / Drug Use Pain Medications: see PTA meds Prescriptions: see PTA meds Over the Counter: see PTA meds History of alcohol / drug use?: No history of alcohol / drug abuse  CIWA: CIWA-Ar BP: 142/68 Pulse Rate: 76 COWS:    PATIENT STRENGTHS: (choose at least two) Ability for insight Average or above average intelligence Capable of  independent living Supportive family/friends  Allergies:  Allergies  Allergen Reactions  . Aspirin Anaphylaxis    swelling    Home Medications:  (Not in a hospital admission)  OB/GYN Status:  No LMP recorded. Patient has had a hysterectomy.  General Assessment Data Location of Assessment: [facility] ED TTS Assessment: In system Is this a Tele or Face-to-Face Assessment?: Tele Assessment Is this an Initial Assessment or a Re-assessment for this encounter?: Initial Assessment Marital status: Widowed Is patient pregnant?: No Pregnancy Status: No Living Arrangements: Alone Can pt return to current living arrangement?: Yes Admission Status: Voluntary Is patient capable of signing voluntary admission?: Yes Referral Source: Self/Family/Friend Insurance type: Medicare     Crisis Care Plan Living Arrangements: Alone Name of Psychiatrist: none Name of Therapist: none  Education Status Is patient currently in school?: No  Risk to self with the past 6 months Suicidal Ideation: Yes-Currently Present Has patient been a risk to self within the past 6 months prior to admission? : No Suicidal Intent: No Has patient had any suicidal intent within the past 6 months prior to admission? : No Is patient at risk for suicide?: Yes Suicidal Plan?: No Has patient had any suicidal plan within the past 6 months prior to admission? : No Access to Means: No What has been your use of drugs/alcohol within the last 12 months?: no use Previous Attempts/Gestures: No Intentional Self Injurious Behavior: None Family Suicide History: No Recent stressful life event(s): Other (Comment) (worsening hallucinations) Persecutory voices/beliefs?: No Depression: Yes Substance abuse history and/or treatment for substance abuse?: No Suicide prevention information given to non-admitted patients: Not applicable  Risk to Others within the past 6 months Homicidal Ideation: No Does patient have any lifetime risk of  violence toward others beyond the six months prior to admission? : No Thoughts of Harm to Others: No Current Homicidal Intent: No Current Homicidal Plan: No Access to Homicidal Means: No History of harm to others?: No Assessment of Violence: None Noted Does patient have access to weapons?: No Criminal Charges Pending?: No Does patient have a court date: No Is patient on probation?: No  Psychosis Hallucinations: Tactile, Visual Delusions: Unspecified  Mental Status Report Appearance/Hygiene: Unremarkable Eye Contact: Good Motor Activity: Unremarkable Speech: Logical/coherent Level of Consciousness: Alert Mood: Pleasant, Sad Affect: Appropriate to circumstance Anxiety Level: Minimal Thought Processes: Coherent, Relevant Judgement: Partial Orientation: Person, Place, Time, Situation Obsessive Compulsive Thoughts/Behaviors: None  Cognitive Functioning Concentration: Normal Memory: Recent Intact, Remote Intact IQ: Average Insight: see judgement above Impulse Control: Good Appetite: Fair Sleep: No Change Vegetative Symptoms: None  ADLScreening [facility] Assessment Services) Patient's cognitive ability adequate to safely complete daily activities?: Yes Patient able to express need for assistance with ADLs?: Yes Independently performs ADLs?: Yes (appropriate for developmental age)  Prior Inpatient Therapy Prior Inpatient Therapy: No  Prior Outpatient Therapy Prior Outpatient Therapy: No Does patient have an ACCT team?: No Does patient have Intensive In-House Services?  : No Does patient have [facility] services? : No Does patient have P4CC services?: No  ADL Screening (condition at time of admission) Patient's cognitive ability adequate to safely complete daily activities?: Yes Is the patient deaf or have difficulty hearing?: No Does the patient have difficulty seeing, even when wearing glasses/contacts?: No Does the patient have difficulty concentrating, remembering, or  making decisions?: No Patient able to express need for assistance with ADLs?: Yes Does the patient have difficulty dressing or bathing?: No Independently performs ADLs?: Yes (appropriate for developmental age) Does the patient have difficulty walking or climbing stairs?: No Weakness of Legs: None Weakness of Arms/Hands: None  Home Assistive Devices/Equipment Home Assistive Devices/Equipment: None  Therapy Consults (therapy consults require a physician order) PT Evaluation Needed: No OT Evalulation Needed: No SLP Evaluation Needed: No Abuse/Neglect Assessment (Assessment to be complete while patient is alone) Physical Abuse: Denies Verbal Abuse: Denies Sexual Abuse: Denies Exploitation of patient/patient's resources: Denies Self-Neglect: Denies Values / Beliefs Cultural Requests During Hospitalization: None Spiritual Requests During Hospitalization: None Consults Spiritual Care Consult Needed: No Social Work Consult Needed: No Regulatory affairs officer (For Healthcare) Does patient have an advance directive?: No Would patient like information on creating an advanced directive?: No - patient declined information    Additional Information 1:1 In Past 12 Months?: No CIRT Risk: No Elopement Risk: No Does patient have medical clearance?: Yes     Disposition:  Disposition Initial Assessment Completed for this Encounter: Yes (consulted with Elmarie Shiley, NP) Disposition of Patient: Inpatient treatment program Type of inpatient treatment program: Adult [facility] is recommended)  Rexene Edison 07/07/2016 11:10 AM

## 2016-07-07 NOTE — ED Notes (Signed)
[name]; Transporter, transporting pt to CT.

## 2016-07-07 NOTE — ED Notes (Signed)
Patient transported to X-ray 

## 2016-07-07 NOTE — ED Notes (Signed)
Placed pt in wine colored scrubs per nurse. Pt also ambulated to restroom from room. After pt voided pt asked me to look into toilet to see if I seen the worms. Nurse was notified. Pt back in bed and on monitor.

## 2016-07-07 NOTE — ED Notes (Signed)
[name]; Transporter, brought pt back to room from x-ray.

## 2016-07-07 NOTE — ED Triage Notes (Signed)
Per family, pt has hx of dementia and parkinsons. Having hallucinations recently regarding worms, they have become progressively worse. Pt also expressed suicidal thoughts to family last night.

## 2016-07-07 NOTE — ED Notes (Signed)
Pt was wheeled to the bathroom, She was able to ambulate around the bathroom using her cane.  RN found a regular hospital bed and exchanged it w/ the gurney that was in the room for her comfort.

## 2016-07-07 NOTE — ED Notes (Signed)
Patient transported to CT 

## 2016-07-07 NOTE — ED Notes (Signed)
Deloris Ping; Transporter - returned pt to room from [place].

## 2016-07-07 NOTE — ED Notes (Signed)
Urine in a cup at bedside labled and timed.

## 2016-07-07 NOTE — ED Notes (Signed)
Pt on with TTS - daughter at bedside

## 2016-07-07 NOTE — ED Notes (Signed)
Took pt to restroom via wheelchair 

## 2016-07-07 NOTE — ED Notes (Signed)
This RN spoke with Aldona Bar from behavioral health - no referrals have been made at this point, but the pt will be inpatient. Possible placements are [place], [facility], or [place].

## 2016-07-07 NOTE — ED Notes (Signed)
Message sent to pharmacy requesting carbidpa-levadopa and exelon

## 2016-07-07 NOTE — ED Provider Notes (Signed)
[facility] DEPT Provider Note   CSN: [id number] Arrival date & time: 07/07/16  [id number]  First Provider Contact:  First MD Initiated Contact with Patient 07/07/16 1002        History   Chief Complaint Chief Complaint  Patient presents with  . Hallucinations    HPI [name] is a 75 y.o. female with a history of Parkinson's disease, dementia, diabetes, here for evaluation of hallucinations and suicidal ideations. Daughter is at bedside who lives across the street from patient. She reports patient has had pleasant hallucinations intermittently for several years, but over the past 3-4 weeks, she reports seeing worms. Patient reports constantly seeing worms under her skin, in her eyes, in her urine, in her stool, and her food. Daughter reports that she does not see any worms. Patient seen by her neurologist, Dr. [name], who recommended Seroquel. They have not started the Seroquel. They also saw PCP, Dr. Brigitte Pulse, who tested urine and stool for ova and parasites, which was negative. Patient told daughter asked night that "I just want to end it", states that she has a bottle of pills at home that she will take. Patient reports feeling overall weak, but denies any fevers, chills, chest pain or short of breath, abdominal pain, nausea or vomiting, changes in urinary or bowel habits, other focal numbness or weakness.  HPI  Past Medical History:  Diagnosis Date  . Anxiety   . Coronary artery disease    Mid LAD Stent 2001 Patent By cath on 08/10/2010  . 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 Diabetes mellitus    Type II  . Dyslipidemia   . GERD (gastroesophageal reflux disease)   . HTN (hypertension)   . Parkinson disease [facility])     Patient Active Problem List   Diagnosis Date Noted  . Mild dementia 11/03/2015  . Chest pain 10/03/2015  . DM type 2 goal A1C below 7.5 10/03/2015  . Hyperlipidemia LDL goal <70 09/25/2014  . Adjustment disorder with mixed anxiety and depressed mood  05/09/2013  . Parkinson's disease ([place]) 03/25/2013  . OSA (obstructive sleep apnea) 03/25/2013  . Generalized anxiety disorder 03/25/2013  . Coronary artery disease 03/25/2013  . Diabetes ([place]) 03/25/2013    Past Surgical History:  Procedure Laterality Date  . APPENDECTOMY    . CARDIAC CATHETERIZATION  07/02/2000   normal LV function, 40-50% prox smooth LAD stenosis between 1st and 2nd diagonal, normal Cfx & RCA (Dr. Corky Downs)  . CARDIAC CATHETERIZATION  02/20/2001   normal LV function, 50% stenosis prox to prox 3.5x85mm Nir Elite stent, 80% stenosis in a 1st septal perforating artery (Dr. Corky Downs)  . CARDIAC CATHETERIZATION  06/18/2006   patent LAD stent, dominant Cfx, low-normal EF (Dr. Domenic Moras)  . CARDIAC CATHETERIZATION  08/10/2010   patent mid LAD stent, nonobstructive CAD (Dr. Alma Friendly)  . CHOLECYSTECTOMY  1963  . CORONARY ANGIOPLASTY WITH STENT PLACEMENT  10/31/2000   single vessel CAD (mild degree in mid-LAD 60% stenosis) - predilatation of plaque in mid LAD with cutting balloon angioplasty; 3.0x89mm Nir Elite stent to mid-LAD (Dr. Domenic Moras)  . HYSTERECTOMY  1989  . NM MYOCAR PERF WALL MOTION  2012   lexiscan - normal perfusion, EF 71%, low risk  . TRANSTHORACIC ECHOCARDIOGRAM  2012   EF 60-65%, mild AV regurg, LA mildly dilated, PA peak pressure 52mmHg    OB History    No data available       Home Medications  Prior to Admission medications   Medication Sig Start Date End Date Taking? Authorizing Provider  ALPRAZolam Duanne Moron) 0.5 MG tablet Take 0.25-0.5 mg by mouth at bedtime as needed for anxiety.    Yes Historical Provider, MD  Carbidopa-Levodopa ER (RYTARY) 23.75-95 MG CPCR Take 3 tablets by mouth 3 (three) times daily. Patient taking differently: Take 4 tablets by mouth 3 (three) times daily.  12/27/15  Yes Penni Bombard, MD  metFORMIN (GLUCOPHAGE) 1000 MG tablet Take 1,000 mg by mouth 2 (two) times daily with a meal.     Yes Historical Provider, MD    rivastigmine (EXELON) 4.6 mg/24hr Place 1 patch (4.6 mg total) onto the skin daily. 07/28/13  Yes Penni Bombard, MD  sertraline (ZOLOFT) 50 MG tablet Take 50 mg by mouth daily.   Yes Historical Provider, MD    Family History Family History  Problem Relation Age of Onset  . Heart failure Father   . Parkinson's disease Father   . Stroke Mother   . Parkinson's disease Sister   . ALS Cousin     Social History Social History  Substance Use Topics  . Smoking status: Never Smoker  . Smokeless tobacco: Never Used  . Alcohol use No     Allergies   Aspirin   Review of Systems Review of Systems A 10 point review of systems was completed and was negative except for pertinent positives and negatives as mentioned in the history of present illness    Physical Exam Updated Vital Signs BP 142/68 (BP Location: Right Arm)   Pulse 76   Temp 97.8 F (36.6 C) (Oral)   Resp 20   SpO2 100%   Physical Exam  Constitutional: She appears well-developed. No distress.  Awake, alert and nontoxic in appearance  HENT:  Head: Normocephalic and atraumatic.  Right Ear: External ear normal.  Left Ear: External ear normal.  Mouth/Throat: Oropharynx is clear and moist.  Eyes: Conjunctivae and EOM are normal. Pupils are equal, round, and reactive to light.  Neck: Normal range of motion. No JVD present.  Cardiovascular: Normal rate, regular rhythm and normal heart sounds.   Pulmonary/Chest: Effort normal and breath sounds normal. No stridor.  Abdominal: Soft. There is no tenderness.  Musculoskeletal: Normal range of motion.  Neurological:  Awake, alert, cooperative and aware of situation; motor strength bilaterally; sensation normal to light touch bilaterally; no facial asymmetry; tongue midline; major cranial nerves appear intact;  baseline gait without new ataxia.  Skin: No rash noted. She is not diaphoretic.  Psychiatric: She has a normal mood and affect. Her behavior is normal.  Thought  content is grossly normal, some tangential thinking.  Nursing note and vitals reviewed.    ED Treatments / Results  Labs (all labs ordered are listed, but only abnormal results are displayed) Labs Reviewed  BASIC METABOLIC PANEL - Abnormal; Notable for the following:       Result Value   Glucose, Bld 134 (*)    Creatinine, Ser 1.01 (*)    GFR calc non Af Amer 52 (*)    All other components within normal limits  CBC WITH DIFFERENTIAL/PLATELET - Abnormal; Notable for the following:    Hemoglobin 11.1 (*)    HCT 34.4 (*)    All other components within normal limits  URINALYSIS, ROUTINE W REFLEX MICROSCOPIC (NOT AT [facility]) - Abnormal; Notable for the following:    APPearance TURBID (*)    Hgb urine dipstick TRACE (*)    Nitrite POSITIVE (*)  Leukocytes, UA LARGE (*)    All other components within normal limits  ACETAMINOPHEN LEVEL - Abnormal; Notable for the following:    Acetaminophen (Tylenol), Serum <10 (*)    All other components within normal limits  URINE MICROSCOPIC-ADD ON - Abnormal; Notable for the following:    Squamous Epithelial / LPF 6-30 (*)    Bacteria, UA MANY (*)    All other components within normal limits  URINE CULTURE  SALICYLATE LEVEL  ETHANOL    EKG  EKG Interpretation None       Radiology No results found.  Procedures Procedures (includi, Parkinson'sng critical care time)  Medications Ordered in ED Medications  Carbidopa-Levodopa ER 23.75-95 MG CPCR 3 tablet (not administered)  metFORMIN (GLUCOPHAGE) tablet 1,000 mg (not administered)  rivastigmine (EXELON) 4.6 mg/24hr 4.6 mg (not administered)  sertraline (ZOLOFT) tablet 50 mg (not administered)  cefTRIAXone (ROCEPHIN) 1 g in dextrose 5 % 50 mL IVPB (not administered)     Initial Impression / Assessment and Plan / ED Course  I have reviewed the triage vital signs and the nursing notes.  Pertinent labs & imaging results that were available during my care of the patient were reviewed  by me and considered in my medical decision making (see chart for details).  Clinical Course   Vitals:   07/07/16 0904  BP: 142/68  Pulse: 76  Resp: 20  Temp: 97.8 F (36.6 C)  TempSrc: Oral  SpO2: 100%     Patient with history of dementia, Parkinson, here for evaluation of hallucinations. Patient reports she sees worms in food, feces, urine, eyes and skin. Reports associated suicidal ideations due to the hallucinations. Plan in place to consume unidentified pills at home. Taking carbidopa levodopa, rivastigmine as prescribed. Vital signs stable, afebrile, overall appears well and nontoxic. Pending screening labs, urinalysis, chest x-ray and EKG. Consult TTS. Discussed with my attending, Dr. Ellender Hose who also saw and evaluated patient. Patient had a UTI, urine culture obtained. Started on 1 g Rocephin. Labs otherwise unremarkable. Patient medically cleared Discussed with Samantha, TTS, they are actively seeking placement.  Final Clinical Impressions(s) / ED Diagnoses   Final diagnoses:  Hallucinations  Suicidal ideation    New Prescriptions New Prescriptions   No medications on file     Comer Locket, PA-C 07/07/16 1147    Duffy Bruce, MD 07/08/16 2117

## 2016-07-07 NOTE — ED Notes (Signed)
Dinner tray at bedside

## 2016-07-07 NOTE — ED Notes (Signed)
Diet Coke given to pt. Daughter of pt states that pt is a diabetic, and usually eats around 11am-11:30am, says she will go to [place] to get patient food since food trays take a while to get here. RN Lynnze aware.

## 2016-07-08 DIAGNOSIS — R443 Hallucinations, unspecified: Secondary | ICD-10-CM | POA: Diagnosis not present

## 2016-07-08 LAB — CBG MONITORING, ED: Glucose-Capillary: 105 mg/dL — ABNORMAL HIGH (ref 65–99)

## 2016-07-08 MED ORDER — CARBIDOPA-LEVODOPA ER 23.75-95 MG PO CPCR
4.0000 | ORAL_CAPSULE | ORAL | Status: DC
  Filled 2016-07-08: qty 3

## 2016-07-08 MED ORDER — CARBIDOPA-LEVODOPA ER 23.75-95 MG PO CPCR
4.0000 | ORAL_CAPSULE | ORAL | Status: DC
  Administered 2016-07-08 – 2016-07-09 (×3): 4 via ORAL
  Filled 2016-07-08: qty 4

## 2016-07-08 MED ORDER — CARBIDOPA-LEVODOPA ER 23.75-95 MG PO CPCR: 1.0000 | ORAL_CAPSULE | Freq: Every day | ORAL | Status: DC | PRN

## 2016-07-08 NOTE — Progress Notes (Signed)
Pt has been accepted to [facility] as informed by the [facility] Disposition CSW. CSW is awaiting a call back from facility to see when pt POA can come sign in the pt.    Lendon Collar, [place] ED Clinical Social Worker [phone]

## 2016-07-08 NOTE — Progress Notes (Signed)
CSW has spoken with [name] at [facility] and was informed that pt POA would have to sign her in. CSW spoke with pt daughter [name] and was informed that pt does not have a POA. Pt daughter wants pt to go to a Art therapist facility in [place]. CSW will contact [place] Disposition CSW to follow-up with pt daughter.    Lendon Collar, [place] ED Clinical Social Worker [phone]

## 2016-07-08 NOTE — Progress Notes (Signed)
CSW spoke with [facility] Disposition CSW regarding the pt not having a POA. CSW is awaiting feedback from [facility] Disposition Social Work to follow-up on pt.    Lendon Collar, [place] ED Clinical Social Worker [phone]

## 2016-07-08 NOTE — Progress Notes (Signed)
Disposition CSW completed patient referrals to the following Geropsychiatry facilities:  [facility]  CSW will continue to follow patient for placement needs.  [place] Disposition CSW [phone]

## 2016-07-08 NOTE — Progress Notes (Signed)
CSW was informed by [facility] Disposition CSW that pt referrals have been faxed and is awaiting feedback from placements.CSW will continue to follow-up.    Lendon Collar, [place] ED Clinical Social Worker [phone]

## 2016-07-08 NOTE — Progress Notes (Signed)
MEDICATION RELATED CONSULT NOTE - INITIAL   Documentation for Rytary ER home medication  Assessment: This pt is on her home medication Rytary ER while being held in ED awaiting placement in a Psych facility.  She is in Pod A which has no shadow charts for paperwork, therefore no copy has been of the Home Med Form has been left in the ED.  All three copies will be stored in Pharmacy  Plan:  Rytary ER 23.75mg /95mg  has been received by Pharmacy "Patient's Home Medication Stored By Pharmacy" is stored appropriately in [place]

## 2016-07-08 NOTE — ED Notes (Signed)
Pt oob to shower.

## 2016-07-09 DIAGNOSIS — Z7984 Long term (current) use of oral hypoglycemic drugs: Secondary | ICD-10-CM | POA: Diagnosis not present

## 2016-07-09 DIAGNOSIS — R45851 Suicidal ideations: Secondary | ICD-10-CM | POA: Diagnosis not present

## 2016-07-09 DIAGNOSIS — F29 Unspecified psychosis not due to a substance or known physiological condition: Secondary | ICD-10-CM | POA: Diagnosis not present

## 2016-07-09 DIAGNOSIS — I10 Essential (primary) hypertension: Secondary | ICD-10-CM | POA: Diagnosis present

## 2016-07-09 DIAGNOSIS — R443 Hallucinations, unspecified: Secondary | ICD-10-CM | POA: Diagnosis present

## 2016-07-09 DIAGNOSIS — N3000 Acute cystitis without hematuria: Secondary | ICD-10-CM | POA: Diagnosis present

## 2016-07-09 DIAGNOSIS — B353 Tinea pedis: Secondary | ICD-10-CM | POA: Diagnosis not present

## 2016-07-09 DIAGNOSIS — F0391 Unspecified dementia with behavioral disturbance: Secondary | ICD-10-CM | POA: Diagnosis not present

## 2016-07-09 DIAGNOSIS — I2510 Atherosclerotic heart disease of native coronary artery without angina pectoris: Secondary | ICD-10-CM | POA: Diagnosis not present

## 2016-07-09 DIAGNOSIS — N390 Urinary tract infection, site not specified: Secondary | ICD-10-CM | POA: Diagnosis not present

## 2016-07-09 DIAGNOSIS — G20 Parkinson's disease: Secondary | ICD-10-CM | POA: Diagnosis not present

## 2016-07-09 DIAGNOSIS — E559 Vitamin D deficiency, unspecified: Secondary | ICD-10-CM | POA: Diagnosis not present

## 2016-07-09 DIAGNOSIS — G4733 Obstructive sleep apnea (adult) (pediatric): Secondary | ICD-10-CM | POA: Diagnosis present

## 2016-07-09 DIAGNOSIS — Z79899 Other long term (current) drug therapy: Secondary | ICD-10-CM | POA: Diagnosis not present

## 2016-07-09 DIAGNOSIS — F419 Anxiety disorder, unspecified: Secondary | ICD-10-CM | POA: Diagnosis present

## 2016-07-09 DIAGNOSIS — E785 Hyperlipidemia, unspecified: Secondary | ICD-10-CM | POA: Diagnosis not present

## 2016-07-09 DIAGNOSIS — K219 Gastro-esophageal reflux disease without esophagitis: Secondary | ICD-10-CM | POA: Diagnosis present

## 2016-07-09 DIAGNOSIS — E119 Type 2 diabetes mellitus without complications: Secondary | ICD-10-CM | POA: Diagnosis not present

## 2016-07-09 DIAGNOSIS — R42 Dizziness and giddiness: Secondary | ICD-10-CM | POA: Diagnosis not present

## 2016-07-09 DIAGNOSIS — N289 Disorder of kidney and ureter, unspecified: Secondary | ICD-10-CM | POA: Diagnosis not present

## 2016-07-09 LAB — URINE CULTURE

## 2016-07-09 MED ORDER — CIPROFLOXACIN HCL 500 MG PO TABS: 500.0000 mg | ORAL_TABLET | Freq: Once | ORAL | Status: DC

## 2016-07-09 NOTE — ED Notes (Signed)
Pt. Transported by [facility] at this time to [facility]

## 2016-07-09 NOTE — ED Provider Notes (Signed)
Patient accepted to [facility] by Dr. Darlin Coco.  Patient stable. Urine culture is growing Citrobacter that is resistant to Keflex. We'll change to Cipro.  BP (!) 126/54 (BP Location: Right Arm)   Pulse 67   Temp 97.8 F (36.6 C) (Oral)   Resp 17   SpO2 97%     Ezequiel Essex, MD 07/09/16 [id number]

## 2016-07-09 NOTE — ED Notes (Signed)
Rytary ER bottle with 74 tablets released to daughter at this time. Daughter will transport medication to [facility]. Original form put in medical records drawer and copies sent to [facility] and given to daughter   Voluntary consent form signed and faxed to [facility] at this time. Original placed in chart.   Pt. Bed bathed, changed scrubs, and brushed teeth.

## 2016-07-09 NOTE — ED Notes (Signed)
Pt. Belongings sent with daughter

## 2016-07-11 ENCOUNTER — Telehealth: Payer: Self-pay | Admitting: *Deleted

## 2016-07-11 NOTE — Telephone Encounter (Signed)
Spoke with daughter, [name] who stated her mother was admitted to [facility] for medication management. She stated that family had decided not to start the Nuplaizid due to it being a new medicine and the side effects they had read about. She stated her mother will be seen by an MD and psychiatrist to manage her medications and control her hallucinations. She stated her mother must be free of hallucinations for three days before she will be discharged. [name] requested Dr [name] discontinue Seroquel since patient is inpatient now. She stated she will update Dr [name] when her mother is discharged, so she can follow up with Dr [name]. Reminded her that pt has FU at the end of Aug, and that appointment will not be cancelled at this time. She verbalized appreciation for the care Dr [name] has given her mother and his responses to her e mails.  She verbalized understanding of call and appreciation.

## 2016-07-24 DIAGNOSIS — N390 Urinary tract infection, site not specified: Secondary | ICD-10-CM | POA: Diagnosis not present

## 2016-07-24 DIAGNOSIS — R8290 Unspecified abnormal findings in urine: Secondary | ICD-10-CM | POA: Diagnosis not present

## 2016-08-01 ENCOUNTER — Other Ambulatory Visit: Payer: Self-pay | Admitting: Diagnostic Neuroimaging

## 2016-08-04 ENCOUNTER — Encounter: Payer: Self-pay | Admitting: Diagnostic Neuroimaging

## 2016-08-04 ENCOUNTER — Ambulatory Visit (INDEPENDENT_AMBULATORY_CARE_PROVIDER_SITE_OTHER): Payer: Medicare Other | Admitting: Diagnostic Neuroimaging

## 2016-08-04 VITALS — BP 114/63 | HR 67 | Wt 183.6 lb

## 2016-08-04 DIAGNOSIS — G20 Parkinson's disease: Secondary | ICD-10-CM | POA: Diagnosis not present

## 2016-08-04 DIAGNOSIS — F22 Delusional disorders: Secondary | ICD-10-CM

## 2016-08-04 DIAGNOSIS — R441 Visual hallucinations: Secondary | ICD-10-CM | POA: Diagnosis not present

## 2016-08-04 DIAGNOSIS — I2510 Atherosclerotic heart disease of native coronary artery without angina pectoris: Secondary | ICD-10-CM | POA: Diagnosis not present

## 2016-08-04 MED ORDER — RISPERIDONE 0.5 MG PO TBDP: 1.0000 mg | ORAL_TABLET | Freq: Two times a day (BID) | ORAL | 4 refills | Status: DC

## 2016-08-04 MED ORDER — CARBIDOPA-LEVODOPA ER 23.75-95 MG PO CPCR: 3.0000 | ORAL_CAPSULE | Freq: Three times a day (TID) | ORAL | 0 refills | Status: DC

## 2016-08-04 NOTE — Patient Instructions (Signed)
-   increase risperidone to 0.5 - 1mg  twice a day

## 2016-08-04 NOTE — Progress Notes (Signed)
[facility]  PATIENT: [name] DOB: 1941/10/13  REFERRING CLINICIAN:  HISTORY FROM: patient and daughters REASON FOR VISIT: follow up   HISTORICAL  CHIEF COMPLAINT:  Chief Complaint  Patient presents with  . Other    rm 7, dgtrs-Julie, Jennifer, "in [place] x 9 days for hallucinations; on antibioitc for UTI; per daughter, patient still having hallucinations"  . Follow-up    3 month    HISTORY OF PRESENT ILLNESS:   UPDATE 08/04/16: Since last visit, has had increasing hallucinations and delusions, went to [place] [facility], now on risperidone 0.5mg  BID. Still with belief that she has some type of internal infection or parasite.   UPDATE 05/01/16: Since last visit, had dx of UTI and s/p abx treatment. Hallucinations continue. Now with hallucination/delusion that her cat has worms or some infection. Family took patient and cat to the vet, and no worms/infx found.   UPDATE 03/15/16: Since last visit, had progressive malaise, weakness, fatigue, chills, confusion, memory problems over past 1-2 months. In last few days, more severe worsening of generalized sxs. No infx signs. No focal weakness. PD stable, on rytary.  UPDATE 12/27/15: Since last visit, more visual hallucinations (non-threatening). 1 close call fall into a chair.   UPDATE 09/20/15: Since last visit, more wearing off. Also having 1 week every 6 weeks of fatigue, malaise, tired feeling. Also noted a red scalp issue, new, and going to dermatology.   UPDATE 06/21/15: Pt reporting more fam hx of tremor (sister dx'd with PD recently; sister's son and his daughter dx'd with ET; 2 first cousins with ALS; patient's father had ET and PD). Doing well on rytary. Better tremor control. Better sleep. Pt and daughter planning to change living arrangement. Pt planning to sell her home and move in with daughter (after home renovations). Using cane at home. Had muscle spasms, better with heat pad and  tizanidine. Now off muscle relaxer.   UPDATE 03/22/15: Since last visit, tried rytary with excellent results x 1 month; then cost was going to increase significantly. Now back to carb/levo 25/100 1.5/1.5/1.5/2 tabs. Better results over night, but not as good as with rytary. More right knee pain issues.  UPDATE 01/29/15: Since last visit patient having more problems with wearing off. She is taking her carbidopa/levodopa at 6 AM, noon, 4 PM and 7 PM. Patient feeling maximum wearing off symptoms mainly around 10 AM. She notes increasing tremor in arms, body, as well as internal tremulous feeling. Patient also having more problems with difficulty in urination, constipation, balance. Patient continues to live her on her own. Her daughter checks on her several times per week. Patient still able to do light housework. Patient not driving or shopping on her own. Daughter helps with these functions.  UPDATE 07/20/14: Since last visit, doing much better. Now exercising daily (30 minutes) on a abike and with weights / bands. Also incr carb/levo is working better. Some occ wearing off, but pt can usually sense this and make small adjustments with timing / freq and this helps manage symptoms.   UPDATE 01/20/14: Since last visit, tried amantadine, but it gave her flu-like symptoms, confusion, bad side effects. She took x 1 week then stopped. Few months a ago, also began to have intermittent visual hallucinations ("amish people, dogs, animals, children"), mainly at night. These are non-threatening, and she usually goes to sleep after seeing them. She usually can tell that they are not real. Anxiety is stable. Depression is still present. More  balance difficulties. More wearing off of carb/levo (usually after 4 hours). Still driving. Not using cane/walker. Is going to PT for exercises.  UPDATE 10/08/13: Since last visit, patient's husband passed away, and patient struggled with significant grieving and stress. She's finally  come to terms and is doing better with mood and cognitive ability. Patient continues on carbidopa/levodopa, one and half tablets in the morning, one half tablets at noon, half tablet at 3 PM and 2 tablets in the evening. Patient is very attuned to her on off fluctuations and wearing off and is able to adjust her medications on daily basis to manage this. Her mood and anxiety have improved. She's taking Effexor 75 mg at bedtime and lorazepam 0.5 mg tabs, 2 tablets at bedtime. This controls her symptoms quite well. Patient's daughter has noted that tremor is slightly increasing over time. Patient also feels spasms in her abdominal region.  UPDATE 03/25/13: since last visit patient's tremor, balance and gait have worsened. Her memory is slightly worse. She's also becoming very obsessive compulsive. She has difficulty with going to the bathroom and may sit on the toilet for 1 or 2 hours at a time. She's become obsessed with the timing and scheduling going to the bathroom.  UPDATE 06/24/12: Now complaining of right leg pain (behind knee) severe pain since Sunday. Also intermittent since past 4-6 weeks. Started before starting exelon patch. Some cramps in her thighs. No back pain. Increased carb/levo is helping. Not tried clonazepam yet. Exelon patch seems to help her cognitive symptoms.   UPDATE 05/13/12: Cognitive problems, navigation abilities, perseveration and short term memory loss now more problematic.Seems to be progressing at rapid pace per family. Poor sleep at night. More anxiety at night. Carb/levo working, but tends to wear off before next dose. Now takes 4 tabs per day (with meals + 1 qhs). Yesterday had visual hallucination (saw a small boy outside near her husband).   UPDATE 01/10/12: Doing much better on carb/levo; tremor, gait, and mental abilities better.  No wearing off during day.  Sometimes wakes up in middle of night (2-3am) and has shaking, tremor, cannot fall asleep again.  Has snoring and  daytime sleepiness.   PRIOR HPI (11/23/11): 75 year old right-handed female with history of diabetes, coronary artery disease, anxiety, care for hospital followup after possible transient ischemic attack. 10/31/11 - patient woke up, feeling confused, unable to organize and express her thoughts. Her daughter came to see her and was able to understand her, but the patient seemed frustrated and confused and unable to express what she was trying to say. At some point the patient developed some slurred speech and increased tremor. She was taken to the emergency room on 11/01/11 at 12:40am, and enrolled in the TIA protocol.  CT, MRI, carotid u/s and TTE done.  Labs done.  Patient discharged. For past 10 years, patient has had progressive postural tremor. Also has had intermittent episodes of internal sensation of restlessness and anxiety.  She has been treated with primidone since August 2012, without relief of symptoms. She is also on Effexor and Xanax for anxiety and depression.  Patient's daughter has noted progressive memory loss, confusional episodes over the last several years.  Balance and walking deteriorating.   REVIEW OF SYSTEMS: Full 14 system review of systems performed and notable only as per HPI.   ALLERGIES: Allergies  Allergen Reactions  . Aspirin Anaphylaxis    swelling    HOME MEDICATIONS: Outpatient Medications Prior to Visit  Medication Sig Dispense Refill  .  ALPRAZolam (XANAX) 0.5 MG tablet Take 0.25-0.5 mg by mouth at bedtime as needed for anxiety.     . Carbidopa-Levodopa ER (RYTARY) 23.75-95 MG CPCR Take 4 capsules by mouth 3 (three) times daily. At 6AM, 1PM, and 6PM    . Carbidopa-Levodopa ER (RYTARY) 23.75-95 MG CPCR Take 1-2 capsules by mouth as needed (during the night for shakes).    . metFORMIN (GLUCOPHAGE) 1000 MG tablet Take 1,000 mg by mouth 2 (two) times daily with a meal.      . QUEtiapine (SEROQUEL) 25 MG tablet Take 1 tablet (25 mg total) by mouth daily as needed  (agitation or hallucination). 30 tablet 6  . rivastigmine (EXELON) 4.6 mg/24hr APPLY 1 PATCH TOPICALLY DAILY 90 patch 0  . sertraline (ZOLOFT) 50 MG tablet Take 50 mg by mouth daily.     No facility-administered medications prior to visit.     PAST MEDICAL HISTORY: Past Medical History:  Diagnosis Date  . Anxiety   . Coronary artery disease    Mid LAD Stent 2001 Patent By cath on 08/10/2010  . 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 Diabetes mellitus    Type II  . Dyslipidemia   . GERD (gastroesophageal reflux disease)   . HTN (hypertension)   . Parkinson disease ([place])     PAST SURGICAL HISTORY: Past Surgical History:  Procedure Laterality Date  . APPENDECTOMY    . CARDIAC CATHETERIZATION  07/02/2000   normal LV function, 40-50% prox smooth LAD stenosis between 1st and 2nd diagonal, normal Cfx & RCA (Dr. Corky Downs)  . CARDIAC CATHETERIZATION  02/20/2001   normal LV function, 50% stenosis prox to prox 3.5x67mm Nir Elite stent, 80% stenosis in a 1st septal perforating artery (Dr. Corky Downs)  . CARDIAC CATHETERIZATION  06/18/2006   patent LAD stent, dominant Cfx, low-normal EF (Dr. Domenic Moras)  . CARDIAC CATHETERIZATION  08/10/2010   patent mid LAD stent, nonobstructive CAD (Dr. Alma Friendly)  . CHOLECYSTECTOMY  1963  . CORONARY ANGIOPLASTY WITH STENT PLACEMENT  10/31/2000   single vessel CAD (mild degree in mid-LAD 60% stenosis) - predilatation of plaque in mid LAD with cutting balloon angioplasty; 3.0x46mm Nir Elite stent to mid-LAD (Dr. Domenic Moras)  . HYSTERECTOMY  1989  . NM MYOCAR PERF WALL MOTION  2012   lexiscan - normal perfusion, EF 71%, low risk  . TRANSTHORACIC ECHOCARDIOGRAM  2012   EF 60-65%, mild AV regurg, LA mildly dilated, PA peak pressure 73mmHg    FAMILY HISTORY: Family History  Problem Relation Age of Onset  . Heart failure Father   . Parkinson's disease Father   . Stroke Mother   . Parkinson's disease Sister   . ALS Cousin     SOCIAL HISTORY:  Social  History   Social History  . Marital status: Widowed    Spouse name: N/A  . Number of children: 3  . Years of education: 12   Occupational History  . Retired    Social History Main Topics  . Smoking status: Never Smoker  . Smokeless tobacco: Never Used  . Alcohol use No  . Drug use: No  . Sexual activity: Not on file   Other Topics Concern  . Not on file   Social History Narrative   08/04/16 PT living with dgtrs now   Caffeine Use- 3 to 4 cups daily     PHYSICAL EXAM  Vitals:   08/04/16 1211  BP: 114/63  Pulse: 67  Weight: 183 lb 9.6 oz (  83.3 kg)   Wt Readings from Last 3 Encounters:  08/04/16 183 lb 9.6 oz (83.3 kg)  05/02/16 181 lb (82.1 kg)  05/01/16 181 lb (82.1 kg)   Body mass index is 29.63 kg/m.  EXAM: General: Patient is awake, alert and in no acute distress.  Well developed and groomed. Cardiovascular: No carotid artery bruits.  Heart is regular rate and rhythm with no murmurs.  Neurologic Exam  Mental Status: Awake, alert. Language is fluent and comprehension intact. Cranial Nerves: Masked facial expression. Pupils are equal and reactive to light.  Visual fields are full to confrontation.  Conjugate eye movements are full, saccadic .  Ptosis on the left.  Facial sensation and strength are symmetric.  Hearing is intact to finger rub.  Palate elevated symmetrically and uvula is midline.  Shoulder shrug is symmetric.  Tongue is midline. Tremor at the soft pallate.  Motor: INTERMITTENT LUE > RUE MILD REST TREMOR. MILD POSTURAL TREMOR OF BUE.  MILD HEAD TREMOR.  MILD COGWHEELING WITH REINFORCEMENT.  MOD BRADYKINESIA IN LUE > RUE; RLE > LLE. Normal bulk. Full strength in the upper and lower extremities.  No pronator drift. Sensory: Intact and symmetric to light touch. Coordination: No ataxia or dysmetria on finger-nose or rapid alternating movement testing. Gait and Station: SLOW TO STAND, WALK, TURN. LEFT HAND TREMOR; RIGHT HAND USING CANE.   DIAGNOSTIC DATA  (LABS, IMAGING, TESTING) - I reviewed patient records, labs, notes, testing and imaging myself where available.  Lab Results  Component Value Date   WBC 7.1 07/07/2016   HGB 11.1 (L) 07/07/2016   HCT 34.4 (L) 07/07/2016   MCV 88.2 07/07/2016   PLT 288 07/07/2016      Component Value Date/Time   NA 139 07/07/2016 0945   NA 143 03/15/2016 1638   K 4.3 07/07/2016 0945   CL 108 07/07/2016 0945   CO2 24 07/07/2016 0945   GLUCOSE 134 (H) 07/07/2016 0945   BUN 20 07/07/2016 0945   BUN 19 03/15/2016 1638   CREATININE 1.01 (H) 07/07/2016 0945   CALCIUM 9.3 07/07/2016 0945   PROT 7.2 03/15/2016 1638   ALBUMIN 4.3 03/15/2016 1638   AST 11 03/15/2016 1638   ALT 6 03/15/2016 1638   ALKPHOS 102 03/15/2016 1638   BILITOT <0.2 03/15/2016 1638   GFRNONAA 52 (L) 07/07/2016 0945   GFRAA >60 07/07/2016 0945   Lab Results  Component Value Date   CHOL 208 (H) 11/01/2011   HDL 54 11/01/2011   LDLCALC 122 (H) 11/01/2011   TRIG 158 (H) 11/01/2011   CHOLHDL 3.9 11/01/2011   Lab Results  Component Value Date   HGBA1C 7.0 (H) 03/15/2016   Lab Results  Component Value Date   VITAMINB12 231 03/15/2016   Lab Results  Component Value Date   TSH 3.100 03/15/2016    ASSESSMENT AND PLAN  75 y.o. 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 here with Parkinson's disease.   Had been doing much better with rytary with tremor control, but having more hallucinations/delusions. Still with balance issues and fall risk.   Also reports strong fam hx of essential tremor, PD, and ALS. Could represent a genetic/familial form of PD for patient.    Dx:  Parkinson's disease ([place])  Delusion ([place])  Hallucinations, visual    PLAN:  PARKINSON'S DZ - continue rytary (23.75/96) to 3  caps TID) - continue  exelon patch - caution with balance, fall risk --> use walker  DELUSIONS/PSYCHOSIS - increase risperidone to 0.5-1mg  BID - refer to geriatric psychiatry  Meds ordered this encounter  Medications  . Carbidopa-Levodopa ER (RYTARY) 23.75-95 MG CPCR    Sig: Take 3 capsules by mouth 3 (three) times daily. At 6AM, 1PM, and 6PM    Dispense:  70 capsule    Refill:  0  . risperiDONE (RISPERDAL M-TABS) 0.5 MG disintegrating tablet    Sig: Take 2 tablets (1 mg total) by mouth 2 (two) times daily.    Dispense:  360 tablet    Refill:  4   Orders Placed This Encounter  Procedures  . Ambulatory referral to Psychiatry    Referral Priority:   Routine    Referral Type:   Psychiatric    Referral Reason:   Specialty Services Required    Requested Specialty:   Psychiatry    Number of Visits Requested:   1   Return in about 3 months (around 11/04/2016).     Penni Bombard, MD [id number], [id number] PM Certified in Neurology, Neurophysiology and Neuroimaging  [facility] [address] [place], [place] [address] [phone]

## 2016-08-07 ENCOUNTER — Telehealth: Payer: Self-pay | Admitting: Diagnostic Neuroimaging

## 2016-08-07 NOTE — Telephone Encounter (Signed)
Sh[facility] [phone] called to request refill of RIVASTIGMINE

## 2016-08-07 NOTE — Telephone Encounter (Signed)
Spoke with Ed, pharmacist, [facility] and informed him that patient received refill of this medication on 08/02/16. Ed stated he has no record, so verbal order given as pre Dr [name] with refills x 1 year. Ed verbalized understanding, appreciation for call back.

## 2016-08-08 ENCOUNTER — Telehealth: Payer: Self-pay | Admitting: *Deleted

## 2016-08-08 NOTE — Telephone Encounter (Signed)
Received call from [place] inquiring about quantity limit for month's supply of risperidone. Per Dr [name], prescription adjusted to disp 1 mg tabs, #60/month which per Rodena Piety, quantity does not need PA. She stated she will process claim through for medication.

## 2016-08-15 ENCOUNTER — Encounter: Payer: Self-pay | Admitting: Diagnostic Neuroimaging

## 2016-08-16 ENCOUNTER — Telehealth: Payer: Self-pay | Admitting: *Deleted

## 2016-08-16 DIAGNOSIS — G3183 Dementia with Lewy bodies: Secondary | ICD-10-CM | POA: Diagnosis not present

## 2016-08-16 DIAGNOSIS — R443 Hallucinations, unspecified: Secondary | ICD-10-CM | POA: Diagnosis not present

## 2016-08-16 DIAGNOSIS — F329 Major depressive disorder, single episode, unspecified: Secondary | ICD-10-CM | POA: Diagnosis not present

## 2016-08-16 DIAGNOSIS — N3941 Urge incontinence: Secondary | ICD-10-CM | POA: Diagnosis not present

## 2016-08-16 DIAGNOSIS — Z9861 Coronary angioplasty status: Secondary | ICD-10-CM | POA: Diagnosis not present

## 2016-08-16 DIAGNOSIS — Z1389 Encounter for screening for other disorder: Secondary | ICD-10-CM | POA: Diagnosis not present

## 2016-08-16 DIAGNOSIS — I10 Essential (primary) hypertension: Secondary | ICD-10-CM | POA: Diagnosis not present

## 2016-08-16 DIAGNOSIS — E784 Other hyperlipidemia: Secondary | ICD-10-CM | POA: Diagnosis not present

## 2016-08-16 DIAGNOSIS — E1140 Type 2 diabetes mellitus with diabetic neuropathy, unspecified: Secondary | ICD-10-CM | POA: Diagnosis not present

## 2016-08-16 DIAGNOSIS — R350 Frequency of micturition: Secondary | ICD-10-CM | POA: Diagnosis not present

## 2016-08-16 DIAGNOSIS — G20 Parkinson's disease: Secondary | ICD-10-CM | POA: Diagnosis not present

## 2016-08-16 MED ORDER — RISPERIDONE 0.5 MG PO TBDP: 1.0000 mg | ORAL_TABLET | Freq: Two times a day (BID) | ORAL | 0 refills | Status: DC

## 2016-08-16 NOTE — Telephone Encounter (Signed)
Risperidone refill of 3 month supply sent top [address] as daughter requested.

## 2016-08-16 NOTE — Telephone Encounter (Signed)
Started PA for risperidone w/[place] [employer], but call was cut off. Received fax to complete for PA.

## 2016-08-16 NOTE — Telephone Encounter (Signed)
Pt's daughter called to advise [facility] said the insurance will not fill medication until PA is done. She said [facility] faxed over the form. She also said the pt took the last pill today.

## 2016-08-16 NOTE — Telephone Encounter (Signed)
Spoke with daughter, [name] and informed her that PA has been started for risperidone. Informed her that this RN will check on PA tomorrow if reply not received. She verbalized understanding, appreciation.

## 2016-08-17 ENCOUNTER — Other Ambulatory Visit: Payer: Self-pay | Admitting: Diagnostic Neuroimaging

## 2016-08-17 MED ORDER — RISPERIDONE 0.5 MG PO TBDP: 1.0000 mg | ORAL_TABLET | Freq: Two times a day (BID) | ORAL | 0 refills | Status: DC

## 2016-08-18 ENCOUNTER — Other Ambulatory Visit: Payer: Self-pay | Admitting: Diagnostic Neuroimaging

## 2016-08-18 ENCOUNTER — Encounter: Payer: Self-pay | Admitting: *Deleted

## 2016-08-18 NOTE — Progress Notes (Signed)
08/18/16 [employer] risperidone approved for quantity limit exception , authorization ends 08/16/2017

## 2016-08-18 NOTE — Telephone Encounter (Signed)
LVM for dgtr, [name] informing her risperidone has been approved w/[employer] through 08/16/2017.

## 2016-08-21 ENCOUNTER — Telehealth: Payer: Self-pay | Admitting: Diagnostic Neuroimaging

## 2016-08-21 NOTE — Telephone Encounter (Signed)
Pt's daughter called request refill for tizanidine 4 mg to be sent to [facility] . She said the last refill was 06/03/15, she said the pt takes it prn.

## 2016-08-22 NOTE — Telephone Encounter (Signed)
Spoke with [name], daughter to clarify pharmacy of choice. Advised her the [facility] [address] She stated they switched to [facility], but it was okay to [facility]. Advised her the refill will be sent in, and when her mother comes for FU in Nov, to inform this RN of any pharmacies she wants removed from her mother's list. She verbalized understanding, appreciation of call.

## 2016-09-07 ENCOUNTER — Telehealth: Payer: Self-pay | Admitting: Diagnostic Neuroimaging

## 2016-09-07 NOTE — Telephone Encounter (Signed)
Pt's daughter called in request risperiDONE (RISPERDAL M-TABS) 0.5 MG be changed to a regular pill and [id number] [place] [place] - [place] does not carry the disintegrating tablets and they are more expensive.

## 2016-09-07 NOTE — Telephone Encounter (Signed)
Called [facility], spoke with Caryl Pina, pharmacist who stated patient's daughter had been there to get medication. She stated she can take a verbal order for rispeidone tablets. Gave her verbal order, Risperidone tablets, take two 0.5 mg tabs (1 mg total) two times a day, disp #360, 0 refills.  Spoke with Ian Bushman, pharmacist with [facility] and advised him to discontinue the prescription sent electronically on 08/17/16. He stated the only prescription he was seeing was dated in Aug, however he would discontinue it.  Called dgtr, [name] and informed. She verbalized understanding, appreciation.

## 2016-09-19 DIAGNOSIS — N3944 Nocturnal enuresis: Secondary | ICD-10-CM | POA: Diagnosis not present

## 2016-09-19 DIAGNOSIS — N3946 Mixed incontinence: Secondary | ICD-10-CM | POA: Diagnosis not present

## 2016-09-19 DIAGNOSIS — R351 Nocturia: Secondary | ICD-10-CM | POA: Diagnosis not present

## 2016-09-19 DIAGNOSIS — R350 Frequency of micturition: Secondary | ICD-10-CM | POA: Diagnosis not present

## 2016-09-19 DIAGNOSIS — R3914 Feeling of incomplete bladder emptying: Secondary | ICD-10-CM | POA: Diagnosis not present

## 2016-10-17 DIAGNOSIS — N3944 Nocturnal enuresis: Secondary | ICD-10-CM | POA: Diagnosis not present

## 2016-10-17 DIAGNOSIS — N390 Urinary tract infection, site not specified: Secondary | ICD-10-CM | POA: Diagnosis not present

## 2016-10-17 DIAGNOSIS — R3914 Feeling of incomplete bladder emptying: Secondary | ICD-10-CM | POA: Diagnosis not present

## 2016-10-17 DIAGNOSIS — N3946 Mixed incontinence: Secondary | ICD-10-CM | POA: Diagnosis not present

## 2016-10-20 DIAGNOSIS — N3944 Nocturnal enuresis: Secondary | ICD-10-CM | POA: Diagnosis not present

## 2016-10-20 DIAGNOSIS — R350 Frequency of micturition: Secondary | ICD-10-CM | POA: Diagnosis not present

## 2016-10-20 DIAGNOSIS — N3946 Mixed incontinence: Secondary | ICD-10-CM | POA: Diagnosis not present

## 2016-11-07 ENCOUNTER — Ambulatory Visit (INDEPENDENT_AMBULATORY_CARE_PROVIDER_SITE_OTHER): Payer: Medicare Other | Admitting: Diagnostic Neuroimaging

## 2016-11-07 ENCOUNTER — Encounter: Payer: Self-pay | Admitting: Diagnostic Neuroimaging

## 2016-11-07 VITALS — BP 119/62 | HR 68 | Wt 183.0 lb

## 2016-11-07 DIAGNOSIS — I2510 Atherosclerotic heart disease of native coronary artery without angina pectoris: Secondary | ICD-10-CM

## 2016-11-07 DIAGNOSIS — R441 Visual hallucinations: Secondary | ICD-10-CM

## 2016-11-07 DIAGNOSIS — G20 Parkinson's disease: Secondary | ICD-10-CM

## 2016-11-07 DIAGNOSIS — F22 Delusional disorders: Secondary | ICD-10-CM

## 2016-11-07 DIAGNOSIS — R269 Unspecified abnormalities of gait and mobility: Secondary | ICD-10-CM | POA: Diagnosis not present

## 2016-11-07 DIAGNOSIS — R42 Dizziness and giddiness: Secondary | ICD-10-CM

## 2016-11-07 MED ORDER — RIVASTIGMINE 4.6 MG/24HR TD PT24: 4.6000 mg | MEDICATED_PATCH | Freq: Every day | TRANSDERMAL | 4 refills | Status: DC

## 2016-11-07 MED ORDER — CARBIDOPA-LEVODOPA ER 23.75-95 MG PO CPCR: 3.0000 | ORAL_CAPSULE | Freq: Three times a day (TID) | ORAL | 12 refills | Status: DC

## 2016-11-07 NOTE — Progress Notes (Signed)
[facility]  PATIENT: [name] DOB: Jun 28, 1941  REFERRING CLINICIAN:  HISTORY FROM: patient and daughter [name]) REASON FOR VISIT: follow up   HISTORICAL  CHIEF COMPLAINT:  Chief Complaint  Patient presents with  . Parkinson's disease    rm 7, dgtr- [name], "my head hurts in different spots, gets real sore, swells after several days -stays for 2 weeks"  . Follow-up    3 month    HISTORY OF PRESENT ILLNESS:   UPDATE 11/07/16: Since last visit, continues with scalp sensitivity (migratory). Has seen dermatology. Delusions are improved with risperidone. PD is otherwise stable. Now has moved out of her house and now living with 2 daughters (1 month at a time).   UPDATE 08/04/16: Since last visit, has had increasing hallucinations and delusions, went to [facility], now on risperidone 0.5mg  BID. Still with belief that she has some type of internal infection or parasite.   UPDATE 05/01/16: Since last visit, had dx of UTI and s/p abx treatment. Hallucinations continue. Now with hallucination/delusion that her cat has worms or some infection. Family took patient and cat to the vet, and no worms/infx found.   UPDATE 03/15/16: Since last visit, had progressive malaise, weakness, fatigue, chills, confusion, memory problems over past 1-2 months. In last few days, more severe worsening of generalized sxs. No infx signs. No focal weakness. PD stable, on rytary.  UPDATE 12/27/15: Since last visit, more visual hallucinations (non-threatening). 1 close call fall into a chair.   UPDATE 09/20/15: Since last visit, more wearing off. Also having 1 week every 6 weeks of fatigue, malaise, tired feeling. Also noted a red scalp issue, new, and going to dermatology.   UPDATE 06/21/15: Pt reporting more fam hx of tremor (sister dx'd with PD recently; sister's son and his daughter dx'd with ET; 2 first cousins with ALS; patient's father had ET and PD). Doing well on rytary.  Better tremor control. Better sleep. Pt and daughter planning to change living arrangement. Pt planning to sell her home and move in with daughter (after home renovations). Using cane at home. Had muscle spasms, better with heat pad and tizanidine. Now off muscle relaxer.   UPDATE 03/22/15: Since last visit, tried rytary with excellent results x 1 month; then cost was going to increase significantly. Now back to carb/levo 25/100 1.5/1.5/1.5/2 tabs. Better results over night, but not as good as with rytary. More right knee pain issues.  UPDATE 01/29/15: Since last visit patient having more problems with wearing off. She is taking her carbidopa/levodopa at 6 AM, noon, 4 PM and 7 PM. Patient feeling maximum wearing off symptoms mainly around 10 AM. She notes increasing tremor in arms, body, as well as internal tremulous feeling. Patient also having more problems with difficulty in urination, constipation, balance. Patient continues to live her on her own. Her daughter checks on her several times per week. Patient still able to do light housework. Patient not driving or shopping on her own. Daughter helps with these functions.  UPDATE 07/20/14: Since last visit, doing much better. Now exercising daily (30 minutes) on a abike and with weights / bands. Also incr carb/levo is working better. Some occ wearing off, but pt can usually sense this and make small adjustments with timing / freq and this helps manage symptoms.   UPDATE 01/20/14: Since last visit, tried amantadine, but it gave her flu-like symptoms, confusion, bad side effects. She took x 1 week then stopped. Few months a ago, also  began to have intermittent visual hallucinations ("amish people, dogs, animals, children"), mainly at night. These are non-threatening, and she usually goes to sleep after seeing them. She usually can tell that they are not real. Anxiety is stable. Depression is still present. More balance difficulties. More wearing off of carb/levo  (usually after 4 hours). Still driving. Not using cane/walker. Is going to PT for exercises.  UPDATE 10/08/13: Since last visit, patient's husband passed away, and patient struggled with significant grieving and stress. She's finally come to terms and is doing better with mood and cognitive ability. Patient continues on carbidopa/levodopa, one and half tablets in the morning, one half tablets at noon, half tablet at 3 PM and 2 tablets in the evening. Patient is very attuned to her on off fluctuations and wearing off and is able to adjust her medications on daily basis to manage this. Her mood and anxiety have improved. She's taking Effexor 75 mg at bedtime and lorazepam 0.5 mg tabs, 2 tablets at bedtime. This controls her symptoms quite well. Patient's daughter has noted that tremor is slightly increasing over time. Patient also feels spasms in her abdominal region.  UPDATE 03/25/13: since last visit patient's tremor, balance and gait have worsened. Her memory is slightly worse. She's also becoming very obsessive compulsive. She has difficulty with going to the bathroom and may sit on the toilet for 1 or 2 hours at a time. She's become obsessed with the timing and scheduling going to the bathroom.  UPDATE 06/24/12: Now complaining of right leg pain (behind knee) severe pain since Sunday. Also intermittent since past 4-6 weeks. Started before starting exelon patch. Some cramps in her thighs. No back pain. Increased carb/levo is helping. Not tried clonazepam yet. Exelon patch seems to help her cognitive symptoms.   UPDATE 05/13/12: Cognitive problems, navigation abilities, perseveration and short term memory loss now more problematic.Seems to be progressing at rapid pace per family. Poor sleep at night. More anxiety at night. Carb/levo working, but tends to wear off before next dose. Now takes 4 tabs per day (with meals + 1 qhs). Yesterday had visual hallucination (saw a small boy outside near her husband).    UPDATE 01/10/12: Doing much better on carb/levo; tremor, gait, and mental abilities better.  No wearing off during day.  Sometimes wakes up in middle of night (2-3am) and has shaking, tremor, cannot fall asleep again.  Has snoring and daytime sleepiness.   PRIOR HPI (11/23/11): 75 year old right-handed female with history of diabetes, coronary artery disease, anxiety, care for hospital followup after possible transient ischemic attack. 10/31/11 - patient woke up, feeling confused, unable to organize and express her thoughts. Her daughter came to see her and was able to understand her, but the patient seemed frustrated and confused and unable to express what she was trying to say. At some point the patient developed some slurred speech and increased tremor. She was taken to the emergency room on 11/01/11 at 12:40am, and enrolled in the TIA protocol.  CT, MRI, carotid u/s and TTE done.  Labs done.  Patient discharged. For past 10 years, patient has had progressive postural tremor. Also has had intermittent episodes of internal sensation of restlessness and anxiety.  She has been treated with primidone since August 2012, without relief of symptoms. She is also on Effexor and Xanax for anxiety and depression.  Patient's daughter has noted progressive memory loss, confusional episodes over the last several years.  Balance and walking deteriorating.   REVIEW OF SYSTEMS: Full  14 system review of systems performed and negative except: memory loss dizziness speech diff weakness tremors confusion agitation depression anxiety hallucinations leg swelling hearing loss constipation snoring.    ALLERGIES: Allergies  Allergen Reactions  . Aspirin Anaphylaxis    swelling    HOME MEDICATIONS: Outpatient Medications Prior to Visit  Medication Sig Dispense Refill  . ALPRAZolam (XANAX) 0.5 MG tablet Take 0.25-0.5 mg by mouth at bedtime as needed for anxiety.     . Carbidopa-Levodopa ER (RYTARY) 23.75-95 MG CPCR  Take 1-2 capsules by mouth as needed (during the night for shakes).    . Carbidopa-Levodopa ER (RYTARY) 23.75-95 MG CPCR Take 3 capsules by mouth 3 (three) times daily. At 6AM, 1PM, and 6PM 70 capsule 0  . metFORMIN (GLUCOPHAGE) 1000 MG tablet Take 1,000 mg by mouth 2 (two) times daily with a meal.      . QUEtiapine (SEROQUEL) 25 MG tablet Take 1 tablet (25 mg total) by mouth daily as needed (agitation or hallucination). 30 tablet 6  . risperiDONE (RISPERDAL) 0.5 MG tablet Take 1 mg by mouth 2 (two) times daily.    . rivastigmine (EXELON) 4.6 mg/24hr APPLY 1 PATCH TOPICALLY DAILY 90 patch 0  . sertraline (ZOLOFT) 50 MG tablet Take 50 mg by mouth daily.    Marland Kitchen tiZANidine (ZANAFLEX) 4 MG tablet TAKE 1 TABLET BY MOUTH EVERY 8 HOURS AS NEEDED FOR MUSCLE SPASMS 30 tablet 0  . cephALEXin (KEFLEX) 500 MG capsule 500 mg. Will begin when she completes Cipro course  3  . ciprofloxacin (CIPRO) 500 MG tablet 500 mg.  0   No facility-administered medications prior to visit.     PAST MEDICAL HISTORY: Past Medical History:  Diagnosis Date  . Anxiety   . Coronary artery disease    Mid LAD Stent 2001 Patent By cath on 08/10/2010  . 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 Diabetes mellitus    Type II  . Dyslipidemia   . GERD (gastroesophageal reflux disease)   . HTN (hypertension)   . Parkinson disease ([place])     PAST SURGICAL HISTORY: Past Surgical History:  Procedure Laterality Date  . APPENDECTOMY    . CARDIAC CATHETERIZATION  07/02/2000   normal LV function, 40-50% prox smooth LAD stenosis between 1st and 2nd diagonal, normal Cfx & RCA (Dr. Corky Downs)  . CARDIAC CATHETERIZATION  02/20/2001   normal LV function, 50% stenosis prox to prox 3.5x34mm Nir Elite stent, 80% stenosis in a 1st septal perforating artery (Dr. Corky Downs)  . CARDIAC CATHETERIZATION  06/18/2006   patent LAD stent, dominant Cfx, low-normal EF (Dr. Domenic Moras)  . CARDIAC CATHETERIZATION  08/10/2010   patent mid LAD stent,  nonobstructive CAD (Dr. Alma Friendly)  . CHOLECYSTECTOMY  1963  . CORONARY ANGIOPLASTY WITH STENT PLACEMENT  10/31/2000   single vessel CAD (mild degree in mid-LAD 60% stenosis) - predilatation of plaque in mid LAD with cutting balloon angioplasty; 3.0x59mm Nir Elite stent to mid-LAD (Dr. Domenic Moras)  . HYSTERECTOMY  1989  . NM MYOCAR PERF WALL MOTION  2012   lexiscan - normal perfusion, EF 71%, low risk  . TRANSTHORACIC ECHOCARDIOGRAM  2012   EF 60-65%, mild AV regurg, LA mildly dilated, PA peak pressure 31mmHg    FAMILY HISTORY: Family History  Problem Relation Age of Onset  . Heart failure Father   . Parkinson's disease Father   . Stroke Mother   . Parkinson's disease Sister   . ALS Cousin  SOCIAL HISTORY:  Social History   Social History  . Marital status: Widowed    Spouse name: N/A  . Number of children: 3  . Years of education: 12   Occupational History  . Retired    Social History Main Topics  . Smoking status: Never Smoker  . Smokeless tobacco: Never Used  . Alcohol use No  . Drug use: No  . Sexual activity: Not on file   Other Topics Concern  . Not on file   Social History Narrative   08/04/16 PT living with dgtrs now   Caffeine Use- 3 to 4 cups daily     PHYSICAL EXAM  Vitals:   11/07/16 1124  BP: 119/62  Pulse: 68  Weight: 183 lb (83 kg)   Wt Readings from Last 3 Encounters:  11/07/16 183 lb (83 kg)  08/04/16 183 lb 9.6 oz (83.3 kg)  05/02/16 181 lb (82.1 kg)   Body mass index is 29.54 kg/m.  EXAM: General: Patient is awake, alert and in no acute distress.  Well developed and groomed. Cardiovascular: No carotid artery bruits.  Heart is regular rate and rhythm with no murmurs.  Neurologic Exam  Mental Status: Awake, alert. Language is fluent and comprehension intact. Cranial Nerves: Masked facial expression. Pupils are equal and reactive to light.  Visual fields are full to confrontation.  Conjugate eye movements are full, saccadic .   Ptosis on the left.  Facial sensation and strength are symmetric.  Hearing is intact to finger rub.  Palate elevated symmetrically and uvula is midline.  Shoulder shrug is symmetric.  Tongue is midline. Tremor at the soft pallate.  Motor: RARE LUE > RUE MILD REST TREMOR. MILD POSTURAL TREMOR OF BUE.  MILD HEAD TREMOR.  MILD COGWHEELING WITH REINFORCEMENT.  MOD BRADYKINESIA IN LUE > RUE; RLE > LLE. Normal bulk. Full strength in the upper and lower extremities.  No pronator drift. Sensory: Intact and symmetric to light touch. Coordination: No ataxia or dysmetria on finger-nose or rapid alternating movement testing. Gait and Station: SLOW TO STAND, WALK, TURN. LEFT HAND TREMOR; RIGHT HAND USING CANE.   DIAGNOSTIC DATA (LABS, IMAGING, TESTING) - I reviewed patient records, labs, notes, testing and imaging myself where available.  Lab Results  Component Value Date   WBC 7.1 07/07/2016   HGB 11.1 (L) 07/07/2016   HCT 34.4 (L) 07/07/2016   MCV 88.2 07/07/2016   PLT 288 07/07/2016      Component Value Date/Time   NA 139 07/07/2016 0945   NA 143 03/15/2016 1638   K 4.3 07/07/2016 0945   CL 108 07/07/2016 0945   CO2 24 07/07/2016 0945   GLUCOSE 134 (H) 07/07/2016 0945   BUN 20 07/07/2016 0945   BUN 19 03/15/2016 1638   CREATININE 1.01 (H) 07/07/2016 0945   CALCIUM 9.3 07/07/2016 0945   PROT 7.2 03/15/2016 1638   ALBUMIN 4.3 03/15/2016 1638   AST 11 03/15/2016 1638   ALT 6 03/15/2016 1638   ALKPHOS 102 03/15/2016 1638   BILITOT <0.2 03/15/2016 1638   GFRNONAA 52 (L) 07/07/2016 0945   GFRAA >60 07/07/2016 0945   Lab Results  Component Value Date   CHOL 208 (H) 11/01/2011   HDL 54 11/01/2011   LDLCALC 122 (H) 11/01/2011   TRIG 158 (H) 11/01/2011   CHOLHDL 3.9 11/01/2011   Lab Results  Component Value Date   HGBA1C 7.0 (H) 03/15/2016   Lab Results  Component Value Date   VITAMINB12 231 03/15/2016  Lab Results  Component Value Date   TSH 3.100 03/15/2016    ASSESSMENT  AND PLAN  75 y.o. 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 here with Parkinson's disease.   Had been doing much better with rytary with tremor control, but having more hallucinations/delusions. Still with balance issues and fall risk.   Also reports strong fam hx of essential tremor, PD, and ALS. Could represent a genetic/familial form of PD for patient.    Dx:  Parkinson's disease ([place])  Delusion ([place])  Hallucinations, visual  Dizziness and giddiness  Abnormality of gait    PLAN:  PARKINSON'S DZ - continue rytary (23.75/96) to 3 caps TID - continue exelon patch - caution with balance, fall risk --> use walker  DELUSIONS/PSYCHOSIS - continue risperidone 0.5mg  twice a day  Meds ordered this encounter  Medications  . Carbidopa-Levodopa ER (RYTARY) 23.75-95 MG CPCR    Sig: Take 3 capsules by mouth 3 (three) times daily. At 6AM, 1PM, and 6PM    Dispense:  270 capsule    Refill:  12  . rivastigmine (EXELON) 4.6 mg/24hr    Sig: Place 1 patch (4.6 mg total) onto the skin daily.    Dispense:  90 patch    Refill:  4   Return in about 6 months (around 05/07/2017).     Penni Bombard, MD 0000000, 0000000 PM Certified in Neurology, Neurophysiology and Neuroimaging  [facility] [address], [place], [place] [address] [phone]

## 2016-11-13 DIAGNOSIS — H903 Sensorineural hearing loss, bilateral: Secondary | ICD-10-CM | POA: Diagnosis not present

## 2016-12-04 ENCOUNTER — Other Ambulatory Visit: Payer: Self-pay | Admitting: Diagnostic Neuroimaging

## 2016-12-10 ENCOUNTER — Other Ambulatory Visit: Payer: Self-pay | Admitting: Diagnostic Neuroimaging

## 2016-12-13 DIAGNOSIS — N390 Urinary tract infection, site not specified: Secondary | ICD-10-CM | POA: Diagnosis not present

## 2016-12-13 DIAGNOSIS — R8299 Other abnormal findings in urine: Secondary | ICD-10-CM | POA: Diagnosis not present

## 2016-12-13 DIAGNOSIS — R350 Frequency of micturition: Secondary | ICD-10-CM | POA: Diagnosis not present

## 2017-01-01 DIAGNOSIS — H0011 Chalazion right upper eyelid: Secondary | ICD-10-CM | POA: Diagnosis not present

## 2017-01-08 ENCOUNTER — Encounter: Payer: Self-pay | Admitting: Diagnostic Neuroimaging

## 2017-01-08 ENCOUNTER — Other Ambulatory Visit: Payer: Self-pay | Admitting: Diagnostic Neuroimaging

## 2017-01-08 MED ORDER — RISPERIDONE 0.5 MG PO TABS: 1.0000 mg | ORAL_TABLET | Freq: Two times a day (BID) | ORAL | 0 refills | Status: DC

## 2017-01-09 DIAGNOSIS — N3944 Nocturnal enuresis: Secondary | ICD-10-CM | POA: Diagnosis not present

## 2017-01-09 DIAGNOSIS — N3946 Mixed incontinence: Secondary | ICD-10-CM | POA: Diagnosis not present

## 2017-01-15 DIAGNOSIS — E1140 Type 2 diabetes mellitus with diabetic neuropathy, unspecified: Secondary | ICD-10-CM | POA: Diagnosis not present

## 2017-01-15 DIAGNOSIS — I10 Essential (primary) hypertension: Secondary | ICD-10-CM | POA: Diagnosis not present

## 2017-01-15 DIAGNOSIS — E559 Vitamin D deficiency, unspecified: Secondary | ICD-10-CM | POA: Diagnosis not present

## 2017-01-15 DIAGNOSIS — E784 Other hyperlipidemia: Secondary | ICD-10-CM | POA: Diagnosis not present

## 2017-01-19 ENCOUNTER — Telehealth: Payer: Self-pay | Admitting: *Deleted

## 2017-01-19 NOTE — Telephone Encounter (Signed)
Rytary Approved through cover my meds, 01/19/17 through 01/19/18.

## 2017-01-19 NOTE — Telephone Encounter (Signed)
Started Rytary PA on cover my meds.

## 2017-01-22 DIAGNOSIS — Z0000 Encounter for general adult medical examination without abnormal findings: Secondary | ICD-10-CM | POA: Diagnosis not present

## 2017-01-22 DIAGNOSIS — G3183 Dementia with Lewy bodies: Secondary | ICD-10-CM | POA: Diagnosis not present

## 2017-01-22 DIAGNOSIS — I10 Essential (primary) hypertension: Secondary | ICD-10-CM | POA: Diagnosis not present

## 2017-01-22 DIAGNOSIS — Z9861 Coronary angioplasty status: Secondary | ICD-10-CM | POA: Diagnosis not present

## 2017-01-22 DIAGNOSIS — F3289 Other specified depressive episodes: Secondary | ICD-10-CM | POA: Diagnosis not present

## 2017-01-22 DIAGNOSIS — D649 Anemia, unspecified: Secondary | ICD-10-CM | POA: Diagnosis not present

## 2017-01-22 DIAGNOSIS — Z1389 Encounter for screening for other disorder: Secondary | ICD-10-CM | POA: Diagnosis not present

## 2017-01-22 DIAGNOSIS — E1140 Type 2 diabetes mellitus with diabetic neuropathy, unspecified: Secondary | ICD-10-CM | POA: Diagnosis not present

## 2017-01-22 DIAGNOSIS — E668 Other obesity: Secondary | ICD-10-CM | POA: Diagnosis not present

## 2017-01-22 DIAGNOSIS — E784 Other hyperlipidemia: Secondary | ICD-10-CM | POA: Diagnosis not present

## 2017-01-22 DIAGNOSIS — G20 Parkinson's disease: Secondary | ICD-10-CM | POA: Diagnosis not present

## 2017-01-22 DIAGNOSIS — Z6830 Body mass index (BMI) 30.0-30.9, adult: Secondary | ICD-10-CM | POA: Diagnosis not present

## 2017-01-23 NOTE — Telephone Encounter (Signed)
[employer] Medicare D coverage approved through 01/19/2018.

## 2017-01-25 ENCOUNTER — Telehealth: Payer: Self-pay | Admitting: *Deleted

## 2017-01-25 NOTE — Telephone Encounter (Signed)
Received call from Clearence Cheek pt assistance program. He had received financial information to assist patient with Rytary. He requested this RN to validate prescription from Dr [name]. Purvis Kilts that Dr [name] had completed the physician portion of patient's papers with refill on Rytary, and this RN had faxed to [employer].  Richardson Landry stated he would complete process and ship medication to patient. He verbalized understanding, appreciation.

## 2017-01-29 DIAGNOSIS — H2513 Age-related nuclear cataract, bilateral: Secondary | ICD-10-CM | POA: Diagnosis not present

## 2017-01-29 DIAGNOSIS — E119 Type 2 diabetes mellitus without complications: Secondary | ICD-10-CM | POA: Diagnosis not present

## 2017-03-16 ENCOUNTER — Encounter (HOSPITAL_COMMUNITY): Payer: Self-pay | Admitting: Emergency Medicine

## 2017-03-16 ENCOUNTER — Ambulatory Visit (HOSPITAL_COMMUNITY)
Admission: EM | Admit: 2017-03-16 | Discharge: 2017-03-16 | Disposition: A | Discharge status: Home / Self Care | Payer: Medicare Other | Attending: Internal Medicine | Admitting: Internal Medicine

## 2017-03-16 DIAGNOSIS — R220 Localized swelling, mass and lump, head: Secondary | ICD-10-CM | POA: Diagnosis not present

## 2017-03-16 DIAGNOSIS — T7840XA Allergy, unspecified, initial encounter: Secondary | ICD-10-CM | POA: Diagnosis not present

## 2017-03-16 MED ORDER — METHYLPREDNISOLONE ACETATE 80 MG/ML IJ SUSP
40.0000 mg | Freq: Once | INTRAMUSCULAR | Status: AC
  Administered 2017-03-16: 40 mg via INTRAMUSCULAR

## 2017-03-16 MED ORDER — PREDNISONE 20 MG PO TABS: 20.0000 mg | ORAL_TABLET | Freq: Every day | ORAL | 0 refills | Status: DC

## 2017-03-16 MED ORDER — DIPHENHYDRAMINE HCL 50 MG/ML IJ SOLN
INTRAMUSCULAR | Status: AC
  Filled 2017-03-16: qty 1

## 2017-03-16 MED ORDER — METHYLPREDNISOLONE ACETATE 80 MG/ML IJ SUSP
INTRAMUSCULAR | Status: AC
  Filled 2017-03-16: qty 1

## 2017-03-16 MED ORDER — DIPHENHYDRAMINE HCL 50 MG/ML IJ SOLN
25.0000 mg | Freq: Once | INTRAMUSCULAR | Status: AC
  Administered 2017-03-16: 25 mg via INTRAMUSCULAR

## 2017-03-16 NOTE — ED Triage Notes (Signed)
Pt reports facial swelling and redness that she noticed this morning when she woke up  Hx of parkinson's   Denies dyspnea, SOB, new foods or meds  Brought back on wheelchair  daughter at bedside.   A&O x4... NAD

## 2017-03-16 NOTE — ED Provider Notes (Signed)
CSN: [id number]     Arrival date & time 03/16/17  1319 History   First MD Initiated Contact with Patient 03/16/17 1342     Chief Complaint  Patient presents with  . Facial Swelling   (Consider location/radiation/quality/duration/timing/severity/associated sxs/prior Treatment) 76 year old female presents to clinic in care of her daughter with a chief complaint of facial swelling, and facial redness occurring last night. Daughter gave the patient 25 mg Benadryl earlier today, and has had partial relief in symptoms. No new soaps, detergents, no new medications, no new foods, or other sources of known allergens. She denies any wheezing, shortness of breath, stridor, or difficulty swallowing.   The history is provided by the patient and a relative.    Past Medical History:  Diagnosis Date  . Anxiety   . Coronary artery disease    Mid LAD Stent 2001 Patent By cath on 08/10/2010  . 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 Diabetes mellitus    Type II  . Dyslipidemia   . GERD (gastroesophageal reflux disease)   . HTN (hypertension)   . Parkinson disease [facility])    Past Surgical History:  Procedure Laterality Date  . APPENDECTOMY    . CARDIAC CATHETERIZATION  07/02/2000   normal LV function, 40-50% prox smooth LAD stenosis between 1st and 2nd diagonal, normal Cfx & RCA (Dr. Corky Downs)  . CARDIAC CATHETERIZATION  02/20/2001   normal LV function, 50% stenosis prox to prox 3.5x81mm Nir Elite stent, 80% stenosis in a 1st septal perforating artery (Dr. Corky Downs)  . CARDIAC CATHETERIZATION  06/18/2006   patent LAD stent, dominant Cfx, low-normal EF (Dr. Domenic Moras)  . CARDIAC CATHETERIZATION  08/10/2010   patent mid LAD stent, nonobstructive CAD (Dr. Alma Friendly)  . CHOLECYSTECTOMY  1963  . CORONARY ANGIOPLASTY WITH STENT PLACEMENT  10/31/2000   single vessel CAD (mild degree in mid-LAD 60% stenosis) - predilatation of plaque in mid LAD with cutting balloon angioplasty; 3.0x2mm Nir Elite stent to  mid-LAD (Dr. Domenic Moras)  . HYSTERECTOMY  1989  . NM MYOCAR PERF WALL MOTION  2012   lexiscan - normal perfusion, EF 71%, low risk  . TRANSTHORACIC ECHOCARDIOGRAM  2012   EF 60-65%, mild AV regurg, LA mildly dilated, PA peak pressure 61mmHg   Family History  Problem Relation Age of Onset  . Heart failure Father   . Parkinson's disease Father   . Stroke Mother   . Parkinson's disease Sister   . ALS Cousin    Social History  Substance Use Topics  . Smoking status: Never Smoker  . Smokeless tobacco: Never Used  . Alcohol use No   OB History    No data available     Review of Systems  Constitutional: Negative for chills and fever.  HENT: Positive for facial swelling. Negative for congestion, sinus pain, sinus pressure and sneezing.   Eyes: Negative for photophobia, redness and itching.  Respiratory: Negative for cough, shortness of breath, wheezing and stridor.   Cardiovascular: Negative for chest pain and palpitations.  Gastrointestinal: Negative for abdominal pain, diarrhea, nausea and vomiting.  Genitourinary: Negative for dysuria, frequency and urgency.  Musculoskeletal: Negative for myalgias, neck pain and neck stiffness.  Skin: Positive for color change and rash.  Neurological: Positive for weakness. Negative for dizziness, light-headedness and headaches.    Allergies  Aspirin  Home Medications   Prior to Admission medications   Medication Sig Start Date End Date Taking? Authorizing Provider  ALPRAZolam Duanne Moron) 0.5 MG tablet Take  0.25-0.5 mg by mouth at bedtime as needed for anxiety.    Yes Historical Provider, MD  Carbidopa-Levodopa ER (RYTARY) 23.75-95 MG CPCR Take 3 capsules by mouth 3 (three) times daily. At 6AM, 1PM, and 6PM 11/07/16  Yes Penni Bombard, MD  metFORMIN (GLUCOPHAGE) 1000 MG tablet Take 1,000 mg by mouth 2 (two) times daily with a meal.     Yes Historical Provider, MD  risperiDONE (RISPERDAL) 0.5 MG tablet Take 2 tablets (1 mg total) by mouth 2  (two) times daily. 01/08/17  Yes Penni Bombard, MD  rivastigmine (EXELON) 4.6 mg/24hr Place 1 patch (4.6 mg total) onto the skin daily. 11/07/16  Yes Penni Bombard, MD  sertraline (ZOLOFT) 50 MG tablet Take 50 mg by mouth daily.   Yes Historical Provider, MD  tiZANidine (ZANAFLEX) 4 MG tablet TAKE 1 TABLET BY MOUTH EVERY 8 HOURS AS NEEDED FOR MUSCLE SPASMS 08/22/16  Yes Penni Bombard, MD  predniSONE (DELTASONE) 20 MG tablet Take 1 tablet (20 mg total) by mouth daily with breakfast. 03/16/17   Barnet Glasgow, NP  QUEtiapine (SEROQUEL) 25 MG tablet Take 1 tablet (25 mg total) by mouth daily as needed (agitation or hallucination). 07/07/16   Penni Bombard, MD   Meds Ordered and Administered this Visit   Medications  diphenhydrAMINE (BENADRYL) injection 25 mg (25 mg Intramuscular Given 03/16/17 1427)  methylPREDNISolone acetate (DEPO-MEDROL) injection 40 mg (40 mg Intramuscular Given 03/16/17 1430)    BP 132/87 (BP Location: Right Arm)   Pulse 75   Temp 97.9 F (36.6 C) (Oral)   Resp 20   SpO2 98%  No data found.   Physical Exam  Constitutional: She is oriented to person, place, and time. She appears well-developed and well-nourished. No distress.  HENT:  Head: Normocephalic and atraumatic.  Right Ear: External ear normal.  Left Ear: External ear normal.  Erythema and swelling noted bilaterally periorbital area and along the area of maxillary sinuses  Eyes: Conjunctivae are normal. Pupils are equal, round, and reactive to light. Right eye exhibits no discharge. Left eye exhibits no discharge.  Neck: Normal range of motion.  Cardiovascular: Normal rate and regular rhythm.   Pulmonary/Chest: Effort normal and breath sounds normal. No respiratory distress. She has no wheezes.  Abdominal: Soft. Bowel sounds are normal.  Musculoskeletal: She exhibits no edema or tenderness.  Lymphadenopathy:    She has no cervical adenopathy.  Neurological: She is alert and oriented to  person, place, and time.  Skin: Skin is warm and dry. Capillary refill takes less than 2 seconds. She is not diaphoretic.  Psychiatric: Her behavior is normal.  Nursing note and vitals reviewed.   Urgent Care Course     Procedures (including critical care time)  Labs Review Labs Reviewed - No data to display  Imaging Review No results found.     MDM   1. Allergic reaction, initial encounter    Treating for possible allergic reaction, given Benadryl, Depo-Medrol in clinic. Discharged home on prednisone, encouraged over-the-counter Benadryl every 6 hours and to follow-up with primary care if symptoms persist, or go to the emergency room at any time symptoms worsen.    Barnet Glasgow, NP 03/16/17 1440

## 2017-03-16 NOTE — Discharge Instructions (Signed)
I am treating her mother for an allergic reaction, given her Benadryl, and Depo-Medrol here in clinic. I have prescribed a short course of steroids to take at home, and I would encourage use of over-the-counter Benadryl every 6 hours for the next 1-2 days. If her symptoms continue, follow up with her primary care provider, or return to clinic

## 2017-03-20 DIAGNOSIS — N3946 Mixed incontinence: Secondary | ICD-10-CM | POA: Diagnosis not present

## 2017-03-20 DIAGNOSIS — N3944 Nocturnal enuresis: Secondary | ICD-10-CM | POA: Diagnosis not present

## 2017-04-13 ENCOUNTER — Telehealth: Payer: Self-pay | Admitting: *Deleted

## 2017-04-13 MED ORDER — RISPERIDONE 0.5 MG PO TABS: 1.0000 mg | ORAL_TABLET | Freq: Two times a day (BID) | ORAL | 0 refills | Status: DC

## 2017-04-13 NOTE — Telephone Encounter (Signed)
Refill escribed

## 2017-04-19 DIAGNOSIS — H0011 Chalazion right upper eyelid: Secondary | ICD-10-CM | POA: Diagnosis not present

## 2017-05-01 DIAGNOSIS — H0014 Chalazion left upper eyelid: Secondary | ICD-10-CM | POA: Diagnosis not present

## 2017-05-04 DIAGNOSIS — R8299 Other abnormal findings in urine: Secondary | ICD-10-CM | POA: Diagnosis not present

## 2017-05-04 DIAGNOSIS — G3183 Dementia with Lewy bodies: Secondary | ICD-10-CM | POA: Diagnosis not present

## 2017-05-04 DIAGNOSIS — Z6830 Body mass index (BMI) 30.0-30.9, adult: Secondary | ICD-10-CM | POA: Diagnosis not present

## 2017-05-04 DIAGNOSIS — R600 Localized edema: Secondary | ICD-10-CM | POA: Diagnosis not present

## 2017-05-04 DIAGNOSIS — G20 Parkinson's disease: Secondary | ICD-10-CM | POA: Diagnosis not present

## 2017-05-04 DIAGNOSIS — R350 Frequency of micturition: Secondary | ICD-10-CM | POA: Diagnosis not present

## 2017-05-14 ENCOUNTER — Other Ambulatory Visit: Payer: Self-pay | Admitting: Diagnostic Neuroimaging

## 2017-05-14 ENCOUNTER — Ambulatory Visit (INDEPENDENT_AMBULATORY_CARE_PROVIDER_SITE_OTHER): Payer: Medicare Other | Admitting: Diagnostic Neuroimaging

## 2017-05-14 ENCOUNTER — Encounter: Payer: Self-pay | Admitting: Diagnostic Neuroimaging

## 2017-05-14 VITALS — BP 119/76 | HR 71 | Ht 66.0 in | Wt 187.2 lb

## 2017-05-14 DIAGNOSIS — F0390 Unspecified dementia without behavioral disturbance: Secondary | ICD-10-CM

## 2017-05-14 DIAGNOSIS — G4733 Obstructive sleep apnea (adult) (pediatric): Secondary | ICD-10-CM | POA: Diagnosis not present

## 2017-05-14 DIAGNOSIS — G20 Parkinson's disease: Secondary | ICD-10-CM | POA: Diagnosis not present

## 2017-05-14 DIAGNOSIS — F411 Generalized anxiety disorder: Secondary | ICD-10-CM | POA: Diagnosis not present

## 2017-05-14 DIAGNOSIS — F03A0 Unspecified dementia, mild, without behavioral disturbance, psychotic disturbance, mood disturbance, and anxiety: Secondary | ICD-10-CM

## 2017-05-14 MED ORDER — RIVASTIGMINE 4.6 MG/24HR TD PT24: 4.6000 mg | MEDICATED_PATCH | Freq: Every day | TRANSDERMAL | 4 refills | Status: DC

## 2017-05-14 MED ORDER — CARBIDOPA-LEVODOPA ER 23.75-95 MG PO CPCR: 4.0000 | ORAL_CAPSULE | Freq: Three times a day (TID) | ORAL | 12 refills | Status: DC

## 2017-05-14 MED ORDER — RISPERIDONE 0.5 MG PO TABS: 1.0000 mg | ORAL_TABLET | Freq: Two times a day (BID) | ORAL | 12 refills | Status: DC

## 2017-05-14 MED ORDER — TIZANIDINE HCL 4 MG PO TABS: 4.0000 mg | ORAL_TABLET | Freq: Three times a day (TID) | ORAL | 3 refills | Status: DC | PRN

## 2017-05-14 MED ORDER — RISPERIDONE 0.5 MG PO TABS: 0.5000 mg | ORAL_TABLET | Freq: Two times a day (BID) | ORAL | 12 refills | Status: DC

## 2017-05-14 NOTE — Patient Instructions (Addendum)
PARKINSON'S DZ (established problem, worsening) - increase rytary (23.75/96) to 4 caps three times per day - continue exelon patch - caution with balance, fall risk --> use walker  DELUSIONS/PSYCHOSIS - continue risperidone 0.5mg  twice a day

## 2017-05-14 NOTE — Progress Notes (Signed)
[facility]  PATIENT: [name] DOB: [birth year]  REFERRING CLINICIAN:  HISTORY FROM: patient and daughter [name]) REASON FOR VISIT: follow up   HISTORICAL  CHIEF COMPLAINT:  Chief Complaint  Patient presents with  . Follow-up     6 month    HISTORY OF PRESENT ILLNESS:   UPDATE 05/14/17: Since last visit, more confusion, diff with time, memory, hallucinations. Walking more affected. Diff with getting up and down from chair. Using cane for balance. Planning to go to [place] this summer for vacation. Delusions and hallucinations stable. Patient on risperidone 0.5mg  BID.   UPDATE 11/07/16: Since last visit, continues with scalp sensitivity (migratory). Has seen dermatology. Delusions are improved with risperidone. PD is otherwise stable. Now has moved out of her house and now living with 2 daughters (1 month at a time).   UPDATE 08/04/16: Since last visit, has had increasing hallucinations and delusions, went to [place] [facility], now on risperidone 0.5mg  BID. Still with belief that she has some type of internal infection or parasite.   UPDATE 05/01/16: Since last visit, had dx of UTI and s/p abx treatment. Hallucinations continue. Now with hallucination/delusion that her cat has worms or some infection. Family took patient and cat to the vet, and no worms/infx found.   UPDATE 03/15/16: Since last visit, had progressive malaise, weakness, fatigue, chills, confusion, memory problems over past 1-2 months. In last few days, more severe worsening of generalized sxs. No infx signs. No focal weakness. PD stable, on rytary.  UPDATE 12/27/15: Since last visit, more visual hallucinations (non-threatening). 1 close call fall into a chair.   UPDATE 09/20/15: Since last visit, more wearing off. Also having 1 week every 6 weeks of fatigue, malaise, tired feeling. Also noted a red scalp issue, new, and going to dermatology.   UPDATE 06/21/15: Pt reporting more fam hx of tremor  (sister dx'd with PD recently; sister's son and his daughter dx'd with ET; 2 first cousins with ALS; patient's father had ET and PD). Doing well on rytary. Better tremor control. Better sleep. Pt and daughter planning to change living arrangement. Pt planning to sell her home and move in with daughter (after home renovations). Using cane at home. Had muscle spasms, better with heat pad and tizanidine. Now off muscle relaxer.   UPDATE 03/22/15: Since last visit, tried rytary with excellent results x 1 month; then cost was going to increase significantly. Now back to carb/levo 25/100 1.5/1.5/1.5/2 tabs. Better results over night, but not as good as with rytary. More right knee pain issues.  UPDATE 01/29/15: Since last visit patient having more problems with wearing off. She is taking her carbidopa/levodopa at 6 AM, noon, 4 PM and 7 PM. Patient feeling maximum wearing off symptoms mainly around 10 AM. She notes increasing tremor in arms, body, as well as internal tremulous feeling. Patient also having more problems with difficulty in urination, constipation, balance. Patient continues to live her on her own. Her daughter checks on her several times per week. Patient still able to do light housework. Patient not driving or shopping on her own. Daughter helps with these functions.  UPDATE 07/20/14: Since last visit, doing much better. Now exercising daily (30 minutes) on a abike and with weights / bands. Also incr carb/levo is working better. Some occ wearing off, but pt can usually sense this and make small adjustments with timing / freq and this helps manage symptoms.   UPDATE 01/20/14: Since last visit, tried amantadine, but it  gave her flu-like symptoms, confusion, bad side effects. She took x 1 week then stopped. Few months a ago, also began to have intermittent visual hallucinations ("amish people, dogs, animals, children"), mainly at night. These are non-threatening, and she usually goes to sleep after seeing  them. She usually can tell that they are not real. Anxiety is stable. Depression is still present. More balance difficulties. More wearing off of carb/levo (usually after 4 hours). Still driving. Not using cane/walker. Is going to PT for exercises.  UPDATE 10/08/13: Since last visit, patient's husband passed away, and patient struggled with significant grieving and stress. She's finally come to terms and is doing better with mood and cognitive ability. Patient continues on carbidopa/levodopa, one and half tablets in the morning, one half tablets at noon, half tablet at 3 PM and 2 tablets in the evening. Patient is very attuned to her on off fluctuations and wearing off and is able to adjust her medications on daily basis to manage this. Her mood and anxiety have improved. She's taking Effexor 75 mg at bedtime and lorazepam 0.5 mg tabs, 2 tablets at bedtime. This controls her symptoms quite well. Patient's daughter has noted that tremor is slightly increasing over time. Patient also feels spasms in her abdominal region.  UPDATE 03/25/13: since last visit patient's tremor, balance and gait have worsened. Her memory is slightly worse. She's also becoming very obsessive compulsive. She has difficulty with going to the bathroom and may sit on the toilet for 1 or 2 hours at a time. She's become obsessed with the timing and scheduling going to the bathroom.  UPDATE 06/24/12: Now complaining of right leg pain (behind knee) severe pain since Sunday. Also intermittent since past 4-6 weeks. Started before starting exelon patch. Some cramps in her thighs. No back pain. Increased carb/levo is helping. Not tried clonazepam yet. Exelon patch seems to help her cognitive symptoms.   UPDATE 05/13/12: Cognitive problems, navigation abilities, perseveration and short term memory loss now more problematic.Seems to be progressing at rapid pace per family. Poor sleep at night. More anxiety at night. Carb/levo working, but tends to  wear off before next dose. Now takes 4 tabs per day (with meals + 1 qhs). Yesterday had visual hallucination (saw a small boy outside near her husband).   UPDATE 01/10/12: Doing much better on carb/levo; tremor, gait, and mental abilities better.  No wearing off during day.  Sometimes wakes up in middle of night (2-3am) and has shaking, tremor, cannot fall asleep again.  Has snoring and daytime sleepiness.   PRIOR HPI (11/23/11): 76 year old right-handed female with history of diabetes, coronary artery disease, anxiety, care for hospital followup after possible transient ischemic attack. 10/31/11 - patient woke up, feeling confused, unable to organize and express her thoughts. Her daughter came to see her and was able to understand her, but the patient seemed frustrated and confused and unable to express what she was trying to say. At some point the patient developed some slurred speech and increased tremor. She was taken to the emergency room on 11/01/11 at 12:40am, and enrolled in the TIA protocol.  CT, MRI, carotid u/s and TTE done.  Labs done.  Patient discharged. For past 10 years, patient has had progressive postural tremor. Also has had intermittent episodes of internal sensation of restlessness and anxiety.  She has been treated with primidone since August 2012, without relief of symptoms. She is also on Effexor and Xanax for anxiety and depression.  Patient's daughter has noted progressive  memory loss, confusional episodes over the last several years.  Balance and walking deteriorating.  REVIEW OF SYSTEMS: Full 14 system review of systems performed and negative except: weakness tremors confusion anxiety constipation fatigue activity eye pain snoring sleep talking.    ALLERGIES: Allergies  Allergen Reactions  . Aspirin Anaphylaxis    swelling    HOME MEDICATIONS: Outpatient Medications Prior to Visit  Medication Sig Dispense Refill  . ALPRAZolam (XANAX) 0.5 MG tablet Take 0.25-0.5 mg by  mouth at bedtime as needed for anxiety.     . Carbidopa-Levodopa ER (RYTARY) 23.75-95 MG CPCR Take 3 capsules by mouth 3 (three) times daily. At 6AM, 1PM, and 6PM 270 capsule 12  . metFORMIN (GLUCOPHAGE) 1000 MG tablet Take 1,000 mg by mouth 2 (two) times daily with a meal.      . risperiDONE (RISPERDAL) 0.5 MG tablet Take 2 tablets (1 mg total) by mouth 2 (two) times daily. 360 tablet 0  . rivastigmine (EXELON) 4.6 mg/24hr Place 1 patch (4.6 mg total) onto the skin daily. 90 patch 4  . sertraline (ZOLOFT) 50 MG tablet Take 50 mg by mouth daily.    Marland Kitchen tiZANidine (ZANAFLEX) 4 MG tablet TAKE 1 TABLET BY MOUTH EVERY 8 HOURS AS NEEDED FOR MUSCLE SPASMS 30 tablet 0  . predniSONE (DELTASONE) 20 MG tablet Take 1 tablet (20 mg total) by mouth daily with breakfast. 5 tablet 0  . QUEtiapine (SEROQUEL) 25 MG tablet Take 1 tablet (25 mg total) by mouth daily as needed (agitation or hallucination). 30 tablet 6   No facility-administered medications prior to visit.     PAST MEDICAL HISTORY: Past Medical History:  Diagnosis Date  . Anxiety   . Coronary artery disease    Mid LAD Stent 2001 Patent By cath on 08/10/2010  . 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 Diabetes mellitus    Type II  . Dyslipidemia   . GERD (gastroesophageal reflux disease)   . HTN (hypertension)   . Parkinson disease ([place])     PAST SURGICAL HISTORY: Past Surgical History:  Procedure Laterality Date  . APPENDECTOMY    . CARDIAC CATHETERIZATION  07/02/2000   normal LV function, 40-50% prox smooth LAD stenosis between 1st and 2nd diagonal, normal Cfx & RCA (Dr. Corky Downs)  . CARDIAC CATHETERIZATION  02/20/2001   normal LV function, 50% stenosis prox to prox 3.5x32mm Nir Elite stent, 80% stenosis in a 1st septal perforating artery (Dr. Corky Downs)  . CARDIAC CATHETERIZATION  06/18/2006   patent LAD stent, dominant Cfx, low-normal EF (Dr. Domenic Moras)  . CARDIAC CATHETERIZATION  08/10/2010   patent mid LAD stent, nonobstructive CAD  (Dr. Alma Friendly)  . CHOLECYSTECTOMY  1963  . CORONARY ANGIOPLASTY WITH STENT PLACEMENT  10/31/2000   single vessel CAD (mild degree in mid-LAD 60% stenosis) - predilatation of plaque in mid LAD with cutting balloon angioplasty; 3.0x36mm Nir Elite stent to mid-LAD (Dr. Domenic Moras)  . HYSTERECTOMY  1989  . NM MYOCAR PERF WALL MOTION  2012   lexiscan - normal perfusion, EF 71%, low risk  . TRANSTHORACIC ECHOCARDIOGRAM  2012   EF 60-65%, mild AV regurg, LA mildly dilated, PA peak pressure 21mmHg    FAMILY HISTORY: Family History  Problem Relation Age of Onset  . Heart failure Father   . Parkinson's disease Father   . Stroke Mother   . Parkinson's disease Sister   . ALS Cousin     SOCIAL HISTORY:  Social History   Social  History  . Marital status: Widowed    Spouse name: N/A  . Number of children: 3  . Years of education: 12   Occupational History  . Retired    Social History Main Topics  . Smoking status: Never Smoker  . Smokeless tobacco: Never Used  . Alcohol use No  . Drug use: No  . Sexual activity: Not on file   Other Topics Concern  . Not on file   Social History Narrative   08/04/16 PT living with dgtrs now   Caffeine Use- 3 to 4 cups daily     PHYSICAL EXAM  Vitals:   05/14/17 1134  BP: 119/76  Pulse: 71  Weight: 187 lb 4 oz (84.9 kg)  Height: 5\' 6"  (1.676 m)   Wt Readings from Last 3 Encounters:  05/14/17 187 lb 4 oz (84.9 kg)  11/07/16 183 lb (83 kg)  08/04/16 183 lb 9.6 oz (83.3 kg)   Body mass index is 30.22 kg/m.  EXAM: General: Patient is awake, alert and in no acute distress.  Well developed and groomed. Cardiovascular: No carotid artery bruits.  Heart is regular rate and rhythm with no murmurs.  Neurologic Exam  Mental Status: Awake, alert. Language is fluent and comprehension intact. Cranial Nerves: Masked facial expression. Pupils are equal and reactive to light.  Visual fields are full to confrontation.  Conjugate eye movements are  full, saccadic .  Ptosis on the left.  Facial sensation and strength are symmetric.  Hearing is intact to finger rub.  Palate elevated symmetrically and uvula is midline.  Shoulder shrug is symmetric.  Tongue is midline. Tremor at the soft pallate.  Motor: RARE LUE > RUE MILD REST TREMOR. MILD POSTURAL TREMOR OF BUE.  MILD HEAD TREMOR.  MILD COGWHEELING WITH REINFORCEMENT.  MOD BRADYKINESIA IN LUE > RUE; RLE > LLE. Normal bulk. Full strength in the upper and lower extremities.  No pronator drift. Sensory: Intact and symmetric to light touch. Coordination: No ataxia or dysmetria on finger-nose or rapid alternating movement testing. Gait and Station: SLOW TO STAND, WALK, TURN. LEFT HAND TREMOR; RIGHT HAND USING CANE.   DIAGNOSTIC DATA (LABS, IMAGING, TESTING) - I reviewed patient records, labs, notes, testing and imaging myself where available.  Lab Results  Component Value Date   WBC 7.1 07/07/2016   HGB 11.1 (L) 07/07/2016   HCT 34.4 (L) 07/07/2016   MCV 88.2 07/07/2016   PLT 288 07/07/2016      Component Value Date/Time   NA 139 07/07/2016 0945   NA 143 03/15/2016 1638   K 4.3 07/07/2016 0945   CL 108 07/07/2016 0945   CO2 24 07/07/2016 0945   GLUCOSE 134 (H) 07/07/2016 0945   BUN 20 07/07/2016 0945   BUN 19 03/15/2016 1638   CREATININE 1.01 (H) 07/07/2016 0945   CALCIUM 9.3 07/07/2016 0945   PROT 7.2 03/15/2016 1638   ALBUMIN 4.3 03/15/2016 1638   AST 11 03/15/2016 1638   ALT 6 03/15/2016 1638   ALKPHOS 102 03/15/2016 1638   BILITOT <0.2 03/15/2016 1638   GFRNONAA 52 (L) 07/07/2016 0945   GFRAA >60 07/07/2016 0945   Lab Results  Component Value Date   CHOL 208 (H) 11/01/2011   HDL 54 11/01/2011   LDLCALC 122 (H) 11/01/2011   TRIG 158 (H) 11/01/2011   CHOLHDL 3.9 11/01/2011   Lab Results  Component Value Date   HGBA1C 7.0 (H) 03/15/2016   Lab Results  Component Value Date   VITAMINB12 231  03/15/2016   Lab Results  Component Value Date   TSH 3.100 03/15/2016      11/01/11 CT head  - Mild white matter hypodensities, a nonspecific finding often secondary to chronic microangiopathic change.  No acute intracranial abnormality identified by CT.  11/01/11 MRI brain - No acute intracranial abnormality.  Minimal to mild for age nonspecific white matter signal changes.    ASSESSMENT AND PLAN  76 y.o. 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 here with Parkinson's disease.   Had been doing much better with rytary with tremor control, but having more hallucinations/delusions. Still with balance issues and fall risk.   Also reports strong fam hx of essential tremor, PD, and ALS. Could represent a genetic/familial form of PD for patient.   Meds tried and failed: carbidopa/levodopa, effexor   Dx:  Parkinson's disease ([place])  Mild dementia  Generalized anxiety disorder  OSA (obstructive sleep apnea)    PLAN:  PARKINSON'S DZ (established problem, worsening) - increase rytary (23.75/96) to 4 caps three times per day - continue exelon patch - caution with balance, fall risk --> use walker - consider lift chair  DELUSIONS/PSYCHOSIS (established problem, worsening) - continue risperidone 0.5mg  twice a day  MUSCLE SPASMS (established problem, stable) - continue tizanidine as needed  ANXIETY (established problem, stable) - continue sertraline and xanax as needed (per PCP)  Meds ordered this encounter  Medications  . tiZANidine (ZANAFLEX) 4 MG tablet    Sig: Take 1 tablet (4 mg total) by mouth every 8 (eight) hours as needed for muscle spasms.    Dispense:  30 tablet    Refill:  3  . rivastigmine (EXELON) 4.6 mg/24hr    Sig: Place 1 patch (4.6 mg total) onto the skin daily.    Dispense:  90 patch    Refill:  4  . DISCONTD: risperiDONE  (RISPERDAL) 0.5 MG tablet    Sig: Take 2 tablets (1 mg total) by mouth 2 (two) times daily.    Dispense:  60 tablet    Refill:  12  . Carbidopa-Levodopa ER (RYTARY) 23.75-95 MG CPCR    Sig: Take 4 capsules by mouth 3 (three) times daily. At 6AM, 1PM, and 6PM    Dispense:  360 capsule    Refill:  12  . risperiDONE (RISPERDAL) 0.5 MG tablet    Sig: Take 1 tablet (0.5 mg total) by mouth 2 (two) times daily.    Dispense:  60 tablet    Refill:  12   Return in about 6 months (around 11/13/2017).     Penni Bombard, MD 08/17/5882, 25:49 AM Certified in Neurology, Neurophysiology and Neuroimaging  [facility] [address] [place], [place] [address] [phone]

## 2017-05-15 DIAGNOSIS — H0011 Chalazion right upper eyelid: Secondary | ICD-10-CM | POA: Diagnosis not present

## 2017-05-16 ENCOUNTER — Telehealth: Payer: Self-pay | Admitting: Diagnostic Neuroimaging

## 2017-05-16 MED ORDER — CARBIDOPA-LEVODOPA ER 23.75-95 MG PO CPCR: 4.0000 | ORAL_CAPSULE | Freq: Three times a day (TID) | ORAL | 3 refills | Status: DC

## 2017-05-16 NOTE — Telephone Encounter (Addendum)
Tried to call pt's daughter back but no answer. Rx re-sent to mail order pharmacy on file, [facility] not [facility]. May call back if this is still incorrect.

## 2017-05-16 NOTE — Addendum Note (Signed)
Addended by: Monte Fantasia on: 05/16/2017 04:22 PM   Modules accepted: Orders

## 2017-05-16 NOTE — Telephone Encounter (Signed)
Pt's daughter request new RX for Carbidopa-Levodopa ER (RYTARY) 23.75-95 MG CPCR sent to [facility] not a local pharmacy.

## 2017-05-17 MED ORDER — CARBIDOPA-LEVODOPA ER 23.75-95 MG PO CPCR: 4.0000 | ORAL_CAPSULE | Freq: Three times a day (TID) | ORAL | 11 refills | Status: DC

## 2017-05-17 NOTE — Addendum Note (Signed)
Addended by: Dewaine Oats on: 05/17/2017 10:26 AM   Modules accepted: Orders

## 2017-05-17 NOTE — Telephone Encounter (Signed)
Rx printed for MD signature

## 2017-05-17 NOTE — Telephone Encounter (Signed)
Pt daughter asking for revised prescription be sent to [facility] FAX#[phone], please call daughter back [phone] or you could message her back on mychart

## 2017-05-17 NOTE — Telephone Encounter (Signed)
Medication fax to [facility] at [phone]. Fax receive and confirmed.

## 2017-05-25 ENCOUNTER — Telehealth: Payer: Self-pay | Admitting: *Deleted

## 2017-05-25 ENCOUNTER — Encounter: Payer: Self-pay | Admitting: Diagnostic Neuroimaging

## 2017-05-25 NOTE — Telephone Encounter (Signed)
Received my chart message from patient's daughter, [name] stating [facility] did not ger Rx for Rytary (increased dose). This RN faxed to number on Rx, [phone], received confirmation of fax received.

## 2017-05-28 ENCOUNTER — Other Ambulatory Visit: Payer: Self-pay | Admitting: Diagnostic Neuroimaging

## 2017-06-17 ENCOUNTER — Observation Stay (HOSPITAL_COMMUNITY)
Admission: EM | Admit: 2017-06-17 | Discharge: 2017-06-18 | Disposition: A | Discharge status: Home / Self Care | Payer: Medicare Other | Attending: Internal Medicine | Admitting: Internal Medicine

## 2017-06-17 ENCOUNTER — Emergency Department (HOSPITAL_COMMUNITY): Payer: Medicare Other

## 2017-06-17 ENCOUNTER — Encounter (HOSPITAL_COMMUNITY): Payer: Self-pay | Admitting: Emergency Medicine

## 2017-06-17 ENCOUNTER — Other Ambulatory Visit: Payer: Self-pay

## 2017-06-17 DIAGNOSIS — F411 Generalized anxiety disorder: Secondary | ICD-10-CM | POA: Diagnosis not present

## 2017-06-17 DIAGNOSIS — G20 Parkinson's disease: Secondary | ICD-10-CM | POA: Diagnosis not present

## 2017-06-17 DIAGNOSIS — Z7984 Long term (current) use of oral hypoglycemic drugs: Secondary | ICD-10-CM | POA: Insufficient documentation

## 2017-06-17 DIAGNOSIS — R0602 Shortness of breath: Secondary | ICD-10-CM | POA: Insufficient documentation

## 2017-06-17 DIAGNOSIS — Z886 Allergy status to analgesic agent status: Secondary | ICD-10-CM | POA: Insufficient documentation

## 2017-06-17 DIAGNOSIS — Z955 Presence of coronary angioplasty implant and graft: Secondary | ICD-10-CM | POA: Insufficient documentation

## 2017-06-17 DIAGNOSIS — K219 Gastro-esophageal reflux disease without esophagitis: Secondary | ICD-10-CM | POA: Insufficient documentation

## 2017-06-17 DIAGNOSIS — E785 Hyperlipidemia, unspecified: Secondary | ICD-10-CM | POA: Diagnosis not present

## 2017-06-17 DIAGNOSIS — R0600 Dyspnea, unspecified: Secondary | ICD-10-CM | POA: Diagnosis present

## 2017-06-17 DIAGNOSIS — E119 Type 2 diabetes mellitus without complications: Secondary | ICD-10-CM | POA: Diagnosis not present

## 2017-06-17 DIAGNOSIS — I2510 Atherosclerotic heart disease of native coronary artery without angina pectoris: Secondary | ICD-10-CM | POA: Insufficient documentation

## 2017-06-17 DIAGNOSIS — I951 Orthostatic hypotension: Secondary | ICD-10-CM | POA: Diagnosis not present

## 2017-06-17 DIAGNOSIS — Z888 Allergy status to other drugs, medicaments and biological substances status: Secondary | ICD-10-CM | POA: Diagnosis not present

## 2017-06-17 DIAGNOSIS — R0789 Other chest pain: Secondary | ICD-10-CM | POA: Diagnosis present

## 2017-06-17 DIAGNOSIS — I10 Essential (primary) hypertension: Secondary | ICD-10-CM | POA: Insufficient documentation

## 2017-06-17 DIAGNOSIS — F0390 Unspecified dementia without behavioral disturbance: Secondary | ICD-10-CM | POA: Diagnosis present

## 2017-06-17 DIAGNOSIS — F0280 Dementia in other diseases classified elsewhere without behavioral disturbance: Secondary | ICD-10-CM | POA: Insufficient documentation

## 2017-06-17 DIAGNOSIS — G4733 Obstructive sleep apnea (adult) (pediatric): Secondary | ICD-10-CM | POA: Diagnosis not present

## 2017-06-17 DIAGNOSIS — R600 Localized edema: Secondary | ICD-10-CM | POA: Diagnosis present

## 2017-06-17 DIAGNOSIS — Z79899 Other long term (current) drug therapy: Secondary | ICD-10-CM | POA: Diagnosis not present

## 2017-06-17 DIAGNOSIS — Z8249 Family history of ischemic heart disease and other diseases of the circulatory system: Secondary | ICD-10-CM | POA: Diagnosis not present

## 2017-06-17 DIAGNOSIS — R531 Weakness: Secondary | ICD-10-CM

## 2017-06-17 LAB — CBC WITH DIFFERENTIAL/PLATELET
Basophils Absolute: 0 10*3/uL (ref 0.0–0.1)
Basophils Relative: 0 %
Eosinophils Absolute: 0.1 10*3/uL (ref 0.0–0.7)
Eosinophils Relative: 2 %
HEMATOCRIT: 36.3 % (ref 36.0–46.0)
HEMOGLOBIN: 11.6 g/dL — AB (ref 12.0–15.0)
LYMPHS ABS: 1.1 10*3/uL (ref 0.7–4.0)
LYMPHS PCT: 14 %
MCH: 28.6 pg (ref 26.0–34.0)
MCHC: 32 g/dL (ref 30.0–36.0)
MCV: 89.6 fL (ref 78.0–100.0)
MONOS PCT: 12 %
Monocytes Absolute: 0.9 10*3/uL (ref 0.1–1.0)
Neutro Abs: 5.4 10*3/uL (ref 1.7–7.7)
Neutrophils Relative %: 72 %
Platelets: 264 10*3/uL (ref 150–400)
RBC: 4.05 MIL/uL (ref 3.87–5.11)
RDW: 13.9 % (ref 11.5–15.5)
WBC: 7.5 10*3/uL (ref 4.0–10.5)

## 2017-06-17 LAB — URINALYSIS, ROUTINE W REFLEX MICROSCOPIC
Bilirubin Urine: NEGATIVE
Glucose, UA: NEGATIVE mg/dL
Hgb urine dipstick: NEGATIVE
Ketones, ur: NEGATIVE mg/dL
Leukocytes, UA: NEGATIVE
NITRITE: NEGATIVE
Protein, ur: NEGATIVE mg/dL
SPECIFIC GRAVITY, URINE: 1.008 (ref 1.005–1.030)
pH: 6 (ref 5.0–8.0)

## 2017-06-17 LAB — GLUCOSE, CAPILLARY
Glucose-Capillary: 186 mg/dL — ABNORMAL HIGH (ref 65–99)
Glucose-Capillary: 105 mg/dL — ABNORMAL HIGH (ref 65–99)

## 2017-06-17 LAB — COMPREHENSIVE METABOLIC PANEL
ALK PHOS: 79 U/L (ref 38–126)
ALT: <5 U/L — ABNORMAL LOW (ref 14–54)
AST: 17 U/L (ref 15–41)
Albumin: 3.6 g/dL (ref 3.5–5.0)
Anion gap: 10 (ref 5–15)
BILIRUBIN TOTAL: 0.5 mg/dL (ref 0.3–1.2)
BUN: 23 mg/dL — ABNORMAL HIGH (ref 6–20)
CALCIUM: 9 mg/dL (ref 8.9–10.3)
CO2: 26 mmol/L (ref 22–32)
CREATININE: 1.14 mg/dL — AB (ref 0.44–1.00)
Chloride: 103 mmol/L (ref 101–111)
GFR calc non Af Amer: 45 mL/min — ABNORMAL LOW (ref >60)
GFR, EST AFRICAN AMERICAN: 52 mL/min — AB (ref >60)
Glucose, Bld: 144 mg/dL — ABNORMAL HIGH (ref 65–99)
Potassium: 4 mmol/L (ref 3.5–5.1)
SODIUM: 139 mmol/L (ref 135–145)
TOTAL PROTEIN: 6.9 g/dL (ref 6.5–8.1)

## 2017-06-17 LAB — I-STAT TROPONIN, ED
Troponin i, poc: 0.01 ng/mL (ref 0.00–0.08)
Troponin i, poc: 0 ng/mL (ref 0.00–0.08)

## 2017-06-17 LAB — MAGNESIUM: Magnesium: 1.7 mg/dL (ref 1.7–2.4)

## 2017-06-17 LAB — PHOSPHORUS: Phosphorus: 4.1 mg/dL (ref 2.5–4.6)

## 2017-06-17 LAB — TSH: TSH: 2.295 u[IU]/mL (ref 0.350–4.500)

## 2017-06-17 LAB — TROPONIN I

## 2017-06-17 LAB — BRAIN NATRIURETIC PEPTIDE: B Natriuretic Peptide: 37.6 pg/mL (ref 0.0–100.0)

## 2017-06-17 LAB — VITAMIN B12: Vitamin B-12: 188 pg/mL (ref 180–914)

## 2017-06-17 MED ORDER — ONDANSETRON HCL 4 MG/2ML IJ SOLN: 4.0000 mg | Freq: Four times a day (QID) | INTRAMUSCULAR | Status: DC | PRN

## 2017-06-17 MED ORDER — SENNOSIDES-DOCUSATE SODIUM 8.6-50 MG PO TABS
1.0000 | ORAL_TABLET | Freq: Every evening | ORAL | Status: DC | PRN
Start: 2017-06-17 — End: 2017-06-18
  Filled 2017-06-17: qty 1

## 2017-06-17 MED ORDER — ENOXAPARIN SODIUM 40 MG/0.4ML ~~LOC~~ SOLN
40.0000 mg | SUBCUTANEOUS | Status: DC
  Administered 2017-06-17: 40 mg via SUBCUTANEOUS
  Filled 2017-06-17: qty 0.4

## 2017-06-17 MED ORDER — ACETAMINOPHEN 325 MG PO TABS: 650.0000 mg | ORAL_TABLET | Freq: Four times a day (QID) | ORAL | Status: DC | PRN

## 2017-06-17 MED ORDER — CARBIDOPA-LEVODOPA ER 23.75-95 MG PO CPCR: 4.0000 | ORAL_CAPSULE | Freq: Three times a day (TID) | ORAL | Status: DC

## 2017-06-17 MED ORDER — CIPROFLOXACIN HCL 0.3 % OP SOLN
1.0000 [drp] | Freq: Four times a day (QID) | OPHTHALMIC | Status: DC
  Filled 2017-06-17: qty 2.5

## 2017-06-17 MED ORDER — SERTRALINE HCL 50 MG PO TABS
50.0000 mg | ORAL_TABLET | Freq: Every day | ORAL | Status: DC
  Administered 2017-06-17 – 2017-06-18 (×2): 50 mg via ORAL
  Filled 2017-06-17 (×2): qty 1

## 2017-06-17 MED ORDER — ONDANSETRON HCL 4 MG PO TABS: 4.0000 mg | ORAL_TABLET | Freq: Four times a day (QID) | ORAL | Status: DC | PRN

## 2017-06-17 MED ORDER — ACETAMINOPHEN 650 MG RE SUPP: 650.0000 mg | Freq: Four times a day (QID) | RECTAL | Status: DC | PRN

## 2017-06-17 MED ORDER — SODIUM CHLORIDE 0.9% FLUSH
3.0000 mL | Freq: Two times a day (BID) | INTRAVENOUS | Status: DC
  Administered 2017-06-17 – 2017-06-18 (×3): 3 mL via INTRAVENOUS

## 2017-06-17 MED ORDER — TRAMADOL HCL 50 MG PO TABS: 50.0000 mg | ORAL_TABLET | Freq: Four times a day (QID) | ORAL | Status: DC | PRN

## 2017-06-17 MED ORDER — INSULIN ASPART 100 UNIT/ML ~~LOC~~ SOLN
0.0000 [IU] | Freq: Three times a day (TID) | SUBCUTANEOUS | Status: DC
  Administered 2017-06-17 – 2017-06-18 (×2): 2 [IU] via SUBCUTANEOUS

## 2017-06-17 MED ORDER — MINOCYCLINE HCL 50 MG PO CAPS
50.0000 mg | ORAL_CAPSULE | Freq: Every day | ORAL | Status: DC
  Administered 2017-06-18: 50 mg via ORAL
  Filled 2017-06-17: qty 1

## 2017-06-17 MED ORDER — SODIUM CHLORIDE 0.9 % IV BOLUS (SEPSIS)
500.0000 mL | Freq: Once | INTRAVENOUS | Status: AC
  Administered 2017-06-17: 500 mL via INTRAVENOUS

## 2017-06-17 MED ORDER — INSULIN ASPART 100 UNIT/ML ~~LOC~~ SOLN
0.0000 [IU] | Freq: Every day | SUBCUTANEOUS | Status: DC
Start: 2017-06-17 — End: 2017-06-18

## 2017-06-17 MED ORDER — SODIUM CHLORIDE 0.9 % IV SOLN
INTRAVENOUS | Status: AC
  Administered 2017-06-17: 15:00:00 via INTRAVENOUS

## 2017-06-17 MED ORDER — GUAIFENESIN ER 600 MG PO TB12
600.0000 mg | ORAL_TABLET | Freq: Two times a day (BID) | ORAL | Status: DC
  Administered 2017-06-17: 600 mg via ORAL
  Filled 2017-06-17 (×2): qty 1

## 2017-06-17 MED ORDER — ALPRAZOLAM 0.25 MG PO TABS: 0.2500 mg | ORAL_TABLET | Freq: Every evening | ORAL | Status: DC | PRN

## 2017-06-17 MED ORDER — RISPERIDONE 0.5 MG PO TABS
0.5000 mg | ORAL_TABLET | Freq: Two times a day (BID) | ORAL | Status: DC
  Administered 2017-06-17 – 2017-06-18 (×2): 0.5 mg via ORAL
  Filled 2017-06-17 (×2): qty 1

## 2017-06-17 MED ORDER — ALBUTEROL SULFATE (2.5 MG/3ML) 0.083% IN NEBU: 2.5000 mg | INHALATION_SOLUTION | RESPIRATORY_TRACT | Status: DC | PRN

## 2017-06-17 MED ORDER — RIVASTIGMINE 4.6 MG/24HR TD PT24
4.6000 mg | MEDICATED_PATCH | Freq: Every day | TRANSDERMAL | Status: DC
  Administered 2017-06-17 – 2017-06-18 (×2): 4.6 mg via TRANSDERMAL
  Filled 2017-06-17 (×2): qty 1

## 2017-06-17 MED ORDER — TIZANIDINE HCL 4 MG PO TABS: 4.0000 mg | ORAL_TABLET | Freq: Three times a day (TID) | ORAL | Status: DC | PRN

## 2017-06-17 MED ORDER — ALBUTEROL SULFATE (2.5 MG/3ML) 0.083% IN NEBU
5.0000 mg | INHALATION_SOLUTION | Freq: Once | RESPIRATORY_TRACT | Status: AC
  Administered 2017-06-17: 5 mg via RESPIRATORY_TRACT

## 2017-06-17 MED ORDER — ALBUTEROL SULFATE (2.5 MG/3ML) 0.083% IN NEBU: 2.5000 mg | INHALATION_SOLUTION | Freq: Four times a day (QID) | RESPIRATORY_TRACT | Status: DC

## 2017-06-17 MED ORDER — ALBUTEROL SULFATE (2.5 MG/3ML) 0.083% IN NEBU
INHALATION_SOLUTION | RESPIRATORY_TRACT | Status: AC
  Filled 2017-06-17: qty 6

## 2017-06-17 MED ORDER — CARBIDOPA-LEVODOPA ER 23.75-95 MG PO CPCR
4.0000 | ORAL_CAPSULE | Freq: Three times a day (TID) | ORAL | Status: DC
  Administered 2017-06-17 – 2017-06-18 (×2): 4 via ORAL

## 2017-06-17 MED ORDER — ALBUTEROL SULFATE (2.5 MG/3ML) 0.083% IN NEBU: 2.5000 mg | INHALATION_SOLUTION | Freq: Four times a day (QID) | RESPIRATORY_TRACT | Status: DC | PRN

## 2017-06-17 NOTE — ED Notes (Addendum)
Pt took her own carbidopa-levodopa ER 23.75-95 mg. Brand only patient supply. Also minocycline 50mg  and vitamin d from personal supplies after approved by dr. Leonette Monarch  Pharmacywill adjust last taken dose.

## 2017-06-17 NOTE — ED Notes (Signed)
Black NP at bedside

## 2017-06-17 NOTE — Plan of Care (Signed)
Problem: Education: Goal: Knowledge of [facility] Education information/materials will improve Outcome: Completed/Met Date Met: 06/17/17 Handbook reviewed with patient and daughter   Problem: Tissue Perfusion: Goal: Risk factors for ineffective tissue perfusion will decrease Outcome: Progressing 95-100% on room air

## 2017-06-17 NOTE — ED Triage Notes (Signed)
Pt. woke up at 3am this morning with SOB worse with exertion / walking , denies cough , no fever or chills .

## 2017-06-17 NOTE — ED Notes (Signed)
IV attetmpted x 2 without nsuccess.

## 2017-06-17 NOTE — ED Notes (Signed)
Monitor shows VTACh and rhythm sazme. Pt  Moves her arm whered tremor from parkinsons is causing monitor artifact. Pt denies c/o and no distress noted clinically. Rhythm resolves when arm moved from chest.

## 2017-06-17 NOTE — ED Provider Notes (Signed)
[facility] DEPT Provider Note   CSN: [id number] Arrival date & time: 06/17/17  0505     History   Chief Complaint Chief Complaint  Patient presents with  . Shortness of Breath    HPI [name] is a 76 y.o. female with a h/o of Parkinson's disease, CAD, and DM who presents to the emergency department with chief complaint of dyspnea with exterion, lightheadedness, and weakness that suddenly worsened last night after the patient ambulated to the bathroom. She states that normally she she is able to lay flat when she sleeps, but now becomes extremely short of breath and is unable to lay flat.  Her daughter reports that the patient is complaining of swelling to the abdomen for the last 3 days with associated bilateral lower extremity swelling. She states that the patient takes Lasix at home; however, she only gives it to her when her legs swell because it makes her more unsteady on her feet and she is concerned about her following. She reports that she gave her a half tablet of Lasix yesterday and 2 days ago with minimal improvement.  She reports gradually worsening weakness over the last 2 weeks. Her daughter reports that at baseline the patient is active and is able to accompany her shopping and with crafts, but has not been as active since the onset of symptoms. She also notes a 10 pound weight gain over the past month to month and a half.  2001, the patient had a stent placed in her proximal LAD. In 2002, catheterization showed a patent LAD stent with 40% narrowing proximal to her stent. In 2011, heart catheterization demonstrated a patent stent without significant other CAD. Echo in 2016 showed an EF of 60-65% with Grade I diastolic dysfunction.  Neurologist: Dr. [name]    The history is provided by the patient and a relative. No language interpreter was used.    Past Medical History:  Diagnosis Date  . Anxiety   . Coronary artery disease    Mid LAD Stent 2001 Patent By  cath on 08/10/2010  . 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 Diabetes mellitus    Type II  . Dyslipidemia   . GERD (gastroesophageal reflux disease)   . HTN (hypertension)   . Parkinson disease [facility])     Patient Active Problem List   Diagnosis Date Noted  . Weakness generalized 06/17/2017  . Orthostatic hypotension 06/17/2017  . Chest tightness 06/17/2017  . Dyspnea 06/17/2017  . Edema of both legs 06/17/2017  . Mild dementia 11/03/2015  . Chest pain 10/03/2015  . Type 2 diabetes mellitus with hemoglobin A1c goal of less than 7.5% ([place]) 10/03/2015  . Hyperlipidemia LDL goal <70 09/25/2014  . Adjustment disorder with mixed anxiety and depressed mood 05/09/2013  . Parkinson's disease ([place]) 03/25/2013  . OSA (obstructive sleep apnea) 03/25/2013  . Generalized anxiety disorder 03/25/2013  . Coronary artery disease 03/25/2013  . Diabetes ([place]) 03/25/2013    Past Surgical History:  Procedure Laterality Date  . APPENDECTOMY    . CARDIAC CATHETERIZATION  07/02/2000   normal LV function, 40-50% prox smooth LAD stenosis between 1st and 2nd diagonal, normal Cfx & RCA (Dr. Corky Downs)  . CARDIAC CATHETERIZATION  02/20/2001   normal LV function, 50% stenosis prox to prox 3.5x36mm Nir Elite stent, 80% stenosis in a 1st septal perforating artery (Dr. Corky Downs)  . CARDIAC CATHETERIZATION  06/18/2006   patent LAD stent, dominant Cfx, low-normal EF (Dr. Domenic Moras)  .  CARDIAC CATHETERIZATION  08/10/2010   patent mid LAD stent, nonobstructive CAD (Dr. Alma Friendly)  . CHOLECYSTECTOMY  1963  . CORONARY ANGIOPLASTY WITH STENT PLACEMENT  10/31/2000   single vessel CAD (mild degree in mid-LAD 60% stenosis) - predilatation of plaque in mid LAD with cutting balloon angioplasty; 3.0x81mm Nir Elite stent to mid-LAD (Dr. Domenic Moras)  . HYSTERECTOMY  1989  . NM MYOCAR PERF WALL MOTION  2012   lexiscan - normal perfusion, EF 71%, low risk  . TRANSTHORACIC ECHOCARDIOGRAM  2012   EF 60-65%, mild AV regurg, LA  mildly dilated, PA peak pressure 41mmHg    OB History    No data available     Home Medications    Prior to Admission medications   Medication Sig Start Date End Date Taking? Authorizing Provider  Cholecalciferol (VITAMIN D) 2000 units tablet Take 2,000 Units by mouth daily after lunch.   Yes [provider]  furosemide (LASIX) 20 MG tablet Take 20 mg by mouth as needed for fluid.   Yes [provider]  metFORMIN (GLUCOPHAGE) 1000 MG tablet Take 1,000 mg by mouth 2 (two) times daily with a meal.   Yes [provider]  minocycline (MINOCIN,DYNACIN) 50 MG capsule Take 50 mg by mouth daily after lunch. Take at 1300    Yes [provider]  risperiDONE (RISPERDAL) 0.5 MG tablet Take 1 tablet (0.5 mg total) by mouth 2 (two) times daily. Patient taking differently: Take 0.5 mg by mouth 2 (two) times daily. 7am and 7pm 05/14/17  Yes Penumalli, Earlean Polka, MD  rivastigmine (EXELON) 4.6 mg/24hr Place 1 patch (4.6 mg total) onto the skin daily. Patient taking differently: Place 4.6 mg onto the skin every evening.  05/14/17  Yes Penumalli, Earlean Polka, MD  sertraline (ZOLOFT) 50 MG tablet Take 50 mg by mouth every evening.    Yes [provider]  ALPRAZolam Duanne Moron) 0.5 MG tablet Take 0.25 mg by mouth at bedtime as needed for anxiety.     [provider]  Carbidopa-Levodopa ER (RYTARY) 23.75-95 MG CPCR Take 3-4 capsules by mouth See admin instructions. Take 4 capsules at 7AM, 3 capsules at 1PM, and 4 capsules 7PM 06/18/17   Eulogio Bear U, DO  Cyanocobalamin (B-12) 1000 MCG SUBL Place 1,000 mcg under the tongue daily. 06/18/17   Geradine Girt, DO  tiZANidine (ZANAFLEX) 4 MG tablet TAKE 1 TABLET(4 MG) BY MOUTH EVERY 8 HOURS AS NEEDED FOR MUSCLE SPASMS 05/15/17   Penumalli, Earlean Polka, MD    Family History Family History  Problem Relation Age of Onset  . Heart failure Father   . Parkinson's disease Father   . Stroke Mother   . Parkinson's disease Sister   .  ALS Cousin     Social History Social History  Substance Use Topics  . Smoking status: Never Smoker  . Smokeless tobacco: Never Used  . Alcohol use No     Allergies   Aspirin and Maxitrol [neomycin-polymyxin-dexameth]   Review of Systems Review of Systems  Constitutional: Negative for activity change, chills and fever.  Respiratory: Positive for shortness of breath. Negative for cough.   Cardiovascular: Negative for chest pain.  Gastrointestinal: Negative for abdominal pain, diarrhea, nausea and vomiting.  Musculoskeletal: Negative for back pain.  Skin: Negative for rash.  Neurological: Positive for weakness and light-headedness.     Physical Exam Updated Vital Signs BP 140/60 (BP Location: Right Arm)   Pulse 76   Temp 98.1 F (36.7 C) (Oral)  Resp 19   Ht 5\' 6"  (1.676 m)   Wt 82.1 kg (181 lb) Comment: Scale C  SpO2 98%   BMI 29.21 kg/m   Physical Exam  Constitutional: No distress.  HENT:  Head: Normocephalic.  Eyes: Conjunctivae are normal.  Neck: Neck supple.  Cardiovascular: Normal rate, regular rhythm and normal heart sounds.  Exam reveals no gallop and no friction rub.   No murmur heard. Pulmonary/Chest: Effort normal. No tachypnea. No respiratory distress. She has no wheezes. She has rhonchi in the right middle field. She has no rales.  Abdominal: Soft. Bowel sounds are normal. She exhibits no distension. There is tenderness. There is no guarding.  Generalized abdominal tenderness to palpation. No focal tenderness.  Musculoskeletal:  1+ bilateral lower extremity pitting edema.  Neurological: She is alert.  Skin: Skin is warm. No rash noted.  Psychiatric: Her behavior is normal.  Nursing note and vitals reviewed.  ED Treatments / Results  Labs (all labs ordered are listed, but only abnormal results are displayed) Labs Reviewed  CBC WITH DIFFERENTIAL/PLATELET - Abnormal; Notable for the following:       Result Value   Hemoglobin 11.6 (*)    All  other components within normal limits  COMPREHENSIVE METABOLIC PANEL - Abnormal; Notable for the following:    Glucose, Bld 144 (*)    BUN 23 (*)    Creatinine, Ser 1.14 (*)    ALT <5 (*)    GFR calc non Af Amer 45 (*)    GFR calc Af Amer 52 (*)    All other components within normal limits  HEMOGLOBIN A1C - Abnormal; Notable for the following:    Hgb A1c MFr Bld 6.7 (*)    All other components within normal limits  GLUCOSE, CAPILLARY - Abnormal; Notable for the following:    Glucose-Capillary 186 (*)    All other components within normal limits  BASIC METABOLIC PANEL - Abnormal; Notable for the following:    Glucose, Bld 174 (*)    All other components within normal limits  GLUCOSE, CAPILLARY - Abnormal; Notable for the following:    Glucose-Capillary 105 (*)    All other components within normal limits  GLUCOSE, CAPILLARY - Abnormal; Notable for the following:    Glucose-Capillary 159 (*)    All other components within normal limits  GLUCOSE, CAPILLARY - Abnormal; Notable for the following:    Glucose-Capillary 119 (*)    All other components within normal limits  BRAIN NATRIURETIC PEPTIDE  MAGNESIUM  PHOSPHORUS  URINALYSIS, ROUTINE W REFLEX MICROSCOPIC  VITAMIN B12  TROPONIN I  TSH  FOLATE RBC  I-STAT TROPOININ, ED  I-STAT TROPOININ, ED    EKG  EKG Interpretation None       Radiology Dg Chest 2 View  Result Date: 06/17/2017 CLINICAL DATA:  Acute onset of shortness of breath, worse with exertion. Initial encounter. EXAM: CHEST  2 VIEW COMPARISON:  Chest radiograph performed 07/07/2016 FINDINGS: The lungs are well-aerated and clear. There is no evidence of focal opacification, pleural effusion or pneumothorax. The heart is normal in size; the mediastinal contour is within normal limits. No acute osseous abnormalities are seen. Mild anterior bridging osteophytes are noted at the midthoracic spine. IMPRESSION: No acute cardiopulmonary process seen. Electronically Signed    By: Garald Balding M.D.   On: 06/17/2017 06:09    Procedures Procedures (including critical care time)  Medications Ordered in ED Medications  0.9 %  sodium chloride infusion ( Intravenous New Bag/Given 06/17/17  1526)  albuterol (PROVENTIL) (2.5 MG/3ML) 0.083% nebulizer solution 5 mg (5 mg Nebulization Given 06/17/17 0522)  sodium chloride 0.9 % bolus 500 mL (500 mLs Intravenous Transfusing/Transfer 06/17/17 1348)     Initial Impression / Assessment and Plan / ED Course  I have reviewed the triage vital signs and the nursing notes.  Pertinent labs & imaging results that were available during my care of the patient were reviewed by me and considered in my medical decision making (see chart for details).     76 year old Female with a history of Parkinson's disease and CAD with orthostatic hypertension. Discussed the patient with Dr. Leonette Monarch, attending physician. Delta troponin unremarkable. Cr slightly elevated to 1.14 from her baseline of 0.8-0.9. CBC unremarkable. No electrolyte abnormalities on CMP. Consulted the hospitalist who will admit the patient for continued workup of orthostatic hypotension.  Final Clinical Impressions(s) / ED Diagnoses   Final diagnoses:  Orthostatic hypotension    New Prescriptions Discharge Medication List as of 06/18/2017  2:12 PM    START taking these medications   Details  Cyanocobalamin (B-12) 1000 MCG SUBL Place 1,000 mcg under the tongue daily., Starting Mon 06/18/2017, Print         Hether Anselmo A, PA-C 06/19/17 0037    Fatima Blank, MD 06/21/17 [phone]

## 2017-06-17 NOTE — H&P (Signed)
History and Physical    [name] ZDG:[id number] DOB: 16-Sep-1941 DOA: 06/17/2017  PCP: [name], MD Patient coming from: home  Chief Complaint: sudden sob and chest tightness with worsening LE edema  HPI: [name] is a 76 y.o. female with medical history significant for Parkinson's, CAD status post stents 2002, diabetes, hypertension, dyslipidemia, mild dementia, anxiety presents emergency department from home with the chief complaint sudden shortness of breath and chest tightness.  Information is obtained from the patient and her daughter who is at the bedside. Patient states over the last several weeks she is experiencing intermittent worsening lower extremity edema. She saw her primary care provider several weeks ago prescribed when necessary diuretic based on weight gain. Daughter reports patient has taken half of the dosage for the last 3 days as lower extremity edema worse and weight was trending up. Early this a.m. patient reports she awakened experiencing sudden shortness of breath. She ambulated to the bathroom and developed a chest "tightness". She states the tightness located left anterior nonradiating. She denies headache dizziness syncope or near-syncope. She denies palpitations diaphoresis nausea or vomiting. She reports she went back to bed and laid down but felt"wierd" all over her body. Episodes persisted so she got up went to her daughter's room. Daughter states patient did not look short of breath but was clearly "uncomfortable". No abdominal pain dysuria hematuria frequency or urgency. No recent fever chills diarrhea constipation melena or bright red blood per rectum. Patient did see her neurologist last month and progress note indicates general worsening of Parkinson's.    ED Course: In the emergency department patient is afebrile with orthostatic hypotension. She is not hypoxic she is nontoxic appearing. Vital with 500 cc normal saline. She is also provided with a  breathing treatment  Review of Systems: As per HPI otherwise all other systems reviewed and are negative.   Ambulatory Status: She uses a cane and/or walker due to unsteady gait secondary to Parkinson's. She lives at home with her daughter is minimal assist with ADLs  Past Medical History:  Diagnosis Date  . Anxiety   . Coronary artery disease    Mid LAD Stent 2001 Patent By cath on 08/10/2010  . 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 Diabetes mellitus    Type II  . Dyslipidemia   . GERD (gastroesophageal reflux disease)   . HTN (hypertension)   . Parkinson disease [facility])     Past Surgical History:  Procedure Laterality Date  . APPENDECTOMY    . CARDIAC CATHETERIZATION  07/02/2000   normal LV function, 40-50% prox smooth LAD stenosis between 1st and 2nd diagonal, normal Cfx & RCA (Dr. Corky Downs)  . CARDIAC CATHETERIZATION  02/20/2001   normal LV function, 50% stenosis prox to prox 3.5x18mm Nir Elite stent, 80% stenosis in a 1st septal perforating artery (Dr. Corky Downs)  . CARDIAC CATHETERIZATION  06/18/2006   patent LAD stent, dominant Cfx, low-normal EF (Dr. Domenic Moras)  . CARDIAC CATHETERIZATION  08/10/2010   patent mid LAD stent, nonobstructive CAD (Dr. Alma Friendly)  . CHOLECYSTECTOMY  1963  . CORONARY ANGIOPLASTY WITH STENT PLACEMENT  10/31/2000   single vessel CAD (mild degree in mid-LAD 60% stenosis) - predilatation of plaque in mid LAD with cutting balloon angioplasty; 3.0x57mm Nir Elite stent to mid-LAD (Dr. Domenic Moras)  . HYSTERECTOMY  1989  . NM MYOCAR PERF WALL MOTION  2012   lexiscan - normal perfusion, EF 71%, low risk  . TRANSTHORACIC ECHOCARDIOGRAM  2012   EF 60-65%, mild AV regurg, LA mildly dilated, PA peak pressure 70mmHg    Social History   Social History  . Marital status: Widowed    Spouse name: N/A  . Number of children: 3  . Years of education: 12   Occupational History  . Retired    Social History Main Topics  . Smoking status: Never Smoker  .  Smokeless tobacco: Never Used  . Alcohol use No  . Drug use: No  . Sexual activity: Not on file   Other Topics Concern  . Not on file   Social History Narrative   08/04/16 PT living with dgtrs now   Caffeine Use- 3 to 4 cups daily    Allergies  Allergen Reactions  . Aspirin Anaphylaxis    swelling  . Maxitrol [Neomycin-Polymyxin-Dexameth] Itching and Other (See Comments)    Redness to the eye    Family History  Problem Relation Age of Onset  . Heart failure Father   . Parkinson's disease Father   . Stroke Mother   . Parkinson's disease Sister   . ALS Cousin     Prior to Admission medications   Medication Sig Start Date End Date Taking? Authorizing Provider  ALPRAZolam Duanne Moron) 0.5 MG tablet Take 0.25-0.5 mg by mouth at bedtime as needed for anxiety.     [provider]  Carbidopa-Levodopa ER (RYTARY) 23.75-95 MG CPCR Take 4 capsules by mouth 3 (three) times daily. At 6AM, 1PM, and 6PM 05/17/17   Penumalli, Earlean Polka, MD  ciprofloxacin (CILOXAN) 0.3 % ophthalmic solution INT 1 GTT INTO AEY QID 05/01/17   [provider]  metFORMIN (GLUCOPHAGE) 1000 MG tablet Take 1,000 mg by mouth 2 (two) times daily with a meal.      [provider]  metFORMIN (GLUCOPHAGE) 1000 MG tablet metformin 1,000 mg tablet    [provider]  minocycline (MINOCIN,DYNACIN) 50 MG capsule TK ONE C PO QD 05/01/17   [provider]  risperiDONE (RISPERDAL) 0.5 MG tablet Take 1 tablet (0.5 mg total) by mouth 2 (two) times daily. 05/14/17   Penumalli, Earlean Polka, MD  rivastigmine (EXELON) 4.6 mg/24hr Place 1 patch (4.6 mg total) onto the skin daily. 05/14/17   Penumalli, Earlean Polka, MD  sertraline (ZOLOFT) 50 MG tablet Take 50 mg by mouth daily.    [provider]  tiZANidine (ZANAFLEX) 4 MG tablet TAKE 1 TABLET(4 MG) BY MOUTH EVERY 8 HOURS AS NEEDED FOR MUSCLE SPASMS 05/15/17   Penni Bombard, MD    Physical Exam: Vitals:   06/17/17 1115 06/17/17 1130 06/17/17  1200 06/17/17 1230  BP: (!) 101/46 (!) 110/48  (!) 134/112  Pulse: 70 76 79 80  Resp: 15 18 15  (!) 22  Temp:      TempSrc:      SpO2: 98% 99% 100% 98%  Weight:      Height:         General:  Appears calm and comfortable, sitting up in bed in no acute distress. Eyes:  PERRL, EOMI, normal lids, iris ENT:  grossly normal hearing, lips & tongue, mucous membranes of her mouth are pink slightly dry Neck:  no LAD, masses or thyromegaly Cardiovascular:  RRR, no m/r/g. Trace to 1+ LE edema.  Respiratory:  CTA bilaterally, no w/r/r. Normal respiratory effort. Abdomen:  soft, ntnd, obese positive bowel sounds throughout but sluggish no guarding or rebounding Skin:  no rash or induration seen on limited exam Musculoskeletal:  grossly normal  tone BUE/BLE, good ROM, no bony abnormality Psychiatric:  grossly normal mood and affect, speech fluent and appropriate, AOx3 Neurologic:  CN 2-12 grossly intact, moves all extremities in coordinated fashion, sensation intact speech clear facial symmetry mild resting tremor  Labs on Admission: I have personally reviewed following labs and imaging studies  CBC:  Recent Labs Lab 06/17/17 0519  WBC 7.5  NEUTROABS 5.4  HGB 11.6*  HCT 36.3  MCV 89.6  PLT 536   Basic Metabolic Panel:  Recent Labs Lab 06/17/17 0519  NA 139  K 4.0  CL 103  CO2 26  GLUCOSE 144*  BUN 23*  CREATININE 1.14*  CALCIUM 9.0   GFR: Estimated Creatinine Clearance: 43.9 mL/min (A) (by C-G formula based on SCr of 1.14 mg/dL (H)). Liver Function Tests:  Recent Labs Lab 06/17/17 0519  AST 17  ALT <5*  ALKPHOS 79  BILITOT 0.5  PROT 6.9  ALBUMIN 3.6   No results for input(s): LIPASE, AMYLASE in the last 168 hours. No results for input(s): AMMONIA in the last 168 hours. Coagulation Profile: No results for input(s): INR, PROTIME in the last 168 hours. Cardiac Enzymes: No results for input(s): CKTOTAL, CKMB, CKMBINDEX, TROPONINI in the last 168 hours. BNP (last 3  results) No results for input(s): PROBNP in the last 8760 hours. HbA1C: No results for input(s): HGBA1C in the last 72 hours. CBG: No results for input(s): GLUCAP in the last 168 hours. Lipid Profile: No results for input(s): CHOL, HDL, LDLCALC, TRIG, CHOLHDL, LDLDIRECT in the last 72 hours. Thyroid Function Tests: No results for input(s): TSH, T4TOTAL, FREET4, T3FREE, THYROIDAB in the last 72 hours. Anemia Panel: No results for input(s): VITAMINB12, FOLATE, FERRITIN, TIBC, IRON, RETICCTPCT in the last 72 hours. Urine analysis:    Component Value Date/Time   COLORURINE YELLOW 07/07/2016 0856   APPEARANCEUR TURBID (A) 07/07/2016 0856   APPEARANCEUR Cloudy (A) 03/15/2016 1640   LABSPEC 1.016 07/07/2016 0856   PHURINE 5.5 07/07/2016 0856   GLUCOSEU NEGATIVE 07/07/2016 0856   HGBUR TRACE (A) 07/07/2016 0856   BILIRUBINUR NEGATIVE 07/07/2016 0856   BILIRUBINUR Negative 03/15/2016 1640   KETONESUR NEGATIVE 07/07/2016 0856   PROTEINUR NEGATIVE 07/07/2016 0856   UROBILINOGEN 0.2 11/01/2011 0358   NITRITE POSITIVE (A) 07/07/2016 0856   LEUKOCYTESUR LARGE (A) 07/07/2016 0856   LEUKOCYTESUR 3+ (A) 03/15/2016 1640    Creatinine Clearance: Estimated Creatinine Clearance: 43.9 mL/min (A) (by C-G formula based on SCr of 1.14 mg/dL (H)).  Sepsis Labs: @LABRCNTIP (procalcitonin:4,lacticidven:4) )No results found for this or any previous visit (from the past 240 hour(s)).   Radiological Exams on Admission: Dg Chest 2 View  Result Date: 06/17/2017 CLINICAL DATA:  Acute onset of shortness of breath, worse with exertion. Initial encounter. EXAM: CHEST  2 VIEW COMPARISON:  Chest radiograph performed 07/07/2016 FINDINGS: The lungs are well-aerated and clear. There is no evidence of focal opacification, pleural effusion or pneumothorax. The heart is normal in size; the mediastinal contour is within normal limits. No acute osseous abnormalities are seen. Mild anterior bridging osteophytes are noted at  the midthoracic spine. IMPRESSION: No acute cardiopulmonary process seen. Electronically Signed   By: Garald Balding M.D.   On: 06/17/2017 06:09    EKG: Independently reviewed. And show Atrial flutter in one lead other leads with P waves  Assessment/Plan Principal Problem:   Orthostatic hypotension Active Problems:   Parkinson's disease (HCC)   OSA (obstructive sleep apnea)   Generalized anxiety disorder   Coronary artery disease  Diabetes ([place])   Hyperlipidemia LDL goal <70   Type 2 diabetes mellitus with hemoglobin A1c goal of less than 7.5% (H[facility])   Mild dementia   Weakness generalized   Chest tightness   Dyspnea   Edema of both legs   #1. Orthostatic hypotension. Etiology may be multifactorial. Mild dehydration secondary to recent diuretic use in setting of Parkinson's Disease. Signs of infectious process. No metabolic derangement. Neuro exam benign. She is provided with IV fluids in the emergency department. -Continue gentle IV fluids -Recheck orthostatic vital signs this evening -monitor intake and output -daily weights  2. Chest tightness/dyspnea/ worsening LE edema.etiology unclear. Patient with a history of CAD status post stents 2002. Echo 2016 reveals an EF of 60% and grade 1 diastolic dysfunction. Atypical. Heart score 3. Initial troponin negative. EKG as noted above. Chest x-ray no acute cardiopulmonary process. Oxygen saturation level greater than 90% on room air. He was provided with nebulizer in the emergency department Patient seen by primary care provider several weeks ago for intermittent lower extremity edema and when necessary diuretics prescribed. She has been taking that at a lower dose for the last 3 days. Chest tightness and dyspnea resolved on admission -tele -cycle troponin -serial ekg -Daily weights -Monitor intake and output -Continue nebulizers -Echocardiogram -Hold diuretic for now  #3. Diabetes. Serum glucose 144 on admission. home medications  include metformin. -Hold metformin for now -Obtain a hemoglobin A1c -Use sliding scale insulin for optimal control  #3. Generalized weakness. Etiology unclear may be a combination of all the above. No signs of infectious process. No metabolic derangement. Of note patient evaluated last month by neurology for Parkinson's and office visit note indicates a generalized worsening of disease. -See above -We'll check TSH B-12 and folate -Physical therapy -Obtain urinalysis -Gentle IV fluids  #4. Obstructive sleep apnea. Does not use C Pap machine.  #5. Generalized anxiety disorder. Appears stable at baseline -Continue home meds  #6. Parkinson's disease. Patient evaluated by neurology last month. Progress note indicates a generalized worsening of disease process. Note indicates patient having more confusion difficulty with time and memory and hallucinations. Noted also indicates more and stay having trouble getting up and down. Note indicates meds tried and failed are carbidopa/levodopa, effexor. Home meds include Rytary and dose increased 05/14/2017 -Continue home meds -physical therapy   DVT prophylaxis: lovenox  Code Status: full  Family Communication: daughter at bedside  Disposition Plan: home Consults called: none  Admission status: obs    Dyanne Carrel M MD [facility]  If 7PM-7AM, please contact night-coverage [email or web address] Password [email or web address]  06/17/2017, 1:02 PM

## 2017-06-18 ENCOUNTER — Other Ambulatory Visit: Payer: Self-pay

## 2017-06-18 ENCOUNTER — Observation Stay (HOSPITAL_BASED_OUTPATIENT_CLINIC_OR_DEPARTMENT_OTHER): Payer: Medicare Other

## 2017-06-18 DIAGNOSIS — I951 Orthostatic hypotension: Principal | ICD-10-CM

## 2017-06-18 DIAGNOSIS — I10 Essential (primary) hypertension: Secondary | ICD-10-CM | POA: Diagnosis not present

## 2017-06-18 DIAGNOSIS — E119 Type 2 diabetes mellitus without complications: Secondary | ICD-10-CM

## 2017-06-18 DIAGNOSIS — K219 Gastro-esophageal reflux disease without esophagitis: Secondary | ICD-10-CM | POA: Diagnosis not present

## 2017-06-18 DIAGNOSIS — R600 Localized edema: Secondary | ICD-10-CM | POA: Diagnosis not present

## 2017-06-18 DIAGNOSIS — R0602 Shortness of breath: Secondary | ICD-10-CM | POA: Diagnosis not present

## 2017-06-18 DIAGNOSIS — F411 Generalized anxiety disorder: Secondary | ICD-10-CM

## 2017-06-18 DIAGNOSIS — G4733 Obstructive sleep apnea (adult) (pediatric): Secondary | ICD-10-CM | POA: Diagnosis not present

## 2017-06-18 DIAGNOSIS — F0280 Dementia in other diseases classified elsewhere without behavioral disturbance: Secondary | ICD-10-CM | POA: Diagnosis not present

## 2017-06-18 DIAGNOSIS — G20 Parkinson's disease: Secondary | ICD-10-CM | POA: Diagnosis not present

## 2017-06-18 DIAGNOSIS — I2510 Atherosclerotic heart disease of native coronary artery without angina pectoris: Secondary | ICD-10-CM | POA: Diagnosis not present

## 2017-06-18 DIAGNOSIS — I351 Nonrheumatic aortic (valve) insufficiency: Secondary | ICD-10-CM | POA: Diagnosis not present

## 2017-06-18 DIAGNOSIS — E785 Hyperlipidemia, unspecified: Secondary | ICD-10-CM | POA: Diagnosis not present

## 2017-06-18 LAB — BASIC METABOLIC PANEL
Anion gap: 8 (ref 5–15)
BUN: 16 mg/dL (ref 6–20)
CHLORIDE: 105 mmol/L (ref 101–111)
CO2: 27 mmol/L (ref 22–32)
Calcium: 8.9 mg/dL (ref 8.9–10.3)
Creatinine, Ser: 0.87 mg/dL (ref 0.44–1.00)
GFR calc Af Amer: >60 mL/min (ref >60)
GFR calc non Af Amer: >60 mL/min (ref >60)
GLUCOSE: 174 mg/dL — AB (ref 65–99)
POTASSIUM: 4.1 mmol/L (ref 3.5–5.1)
Sodium: 140 mmol/L (ref 135–145)

## 2017-06-18 LAB — ECHOCARDIOGRAM COMPLETE
Height: 66 in
WEIGHTICAEL: 2896 [oz_av]

## 2017-06-18 LAB — HEMOGLOBIN A1C
HEMOGLOBIN A1C: 6.7 % — AB (ref 4.8–5.6)
MEAN PLASMA GLUCOSE: 146 mg/dL

## 2017-06-18 LAB — GLUCOSE, CAPILLARY
GLUCOSE-CAPILLARY: 159 mg/dL — AB (ref 65–99)
Glucose-Capillary: 119 mg/dL — ABNORMAL HIGH (ref 65–99)

## 2017-06-18 MED ORDER — CARBIDOPA-LEVODOPA ER 23.75-95 MG PO CPCR
3.0000 | ORAL_CAPSULE | Freq: Every day | ORAL | Status: DC
  Administered 2017-06-18: 3 via ORAL
  Filled 2017-06-18: qty 1

## 2017-06-18 MED ORDER — CARBIDOPA-LEVODOPA ER 23.75-95 MG PO CPCR
4.0000 | ORAL_CAPSULE | Freq: Two times a day (BID) | ORAL | Status: DC
  Filled 2017-06-18: qty 1

## 2017-06-18 MED ORDER — CARBIDOPA-LEVODOPA ER 23.75-95 MG PO CPCR: 3.0000 | ORAL_CAPSULE | ORAL | Status: DC

## 2017-06-18 MED ORDER — B-12 1000 MCG SL SUBL: 1000.0000 ug | SUBLINGUAL_TABLET | Freq: Every day | SUBLINGUAL | 0 refills | Status: DC

## 2017-06-18 MED ORDER — CYANOCOBALAMIN 1000 MCG/ML IJ SOLN
1000.0000 ug | Freq: Every day | INTRAMUSCULAR | Status: DC
  Administered 2017-06-18: 1000 ug via INTRAMUSCULAR
  Filled 2017-06-18: qty 1

## 2017-06-18 NOTE — Evaluation (Addendum)
Physical Therapy Evaluation Patient Details Name: [name] MRN: [id number] DOB: 17-Jun-1941 Today's Date: 06/18/2017   History of Present Illness  Patient is a 76 y.o. female with medical history significant for Parkinson's, CAD status post stents 2002, diabetes, hypertension, dyslipidemia, mild dementia, anxiety admitted with the chief complaint sudden shortness of breath and chest tightness.  Found to have orthostatic hypotension.   Clinical Impression  Patient presents with decreased independence with mobility due to general weakness from hospitalization.  Feel safe for d/c home with daughter assist and no follow up PT.  Encouraged hallway ambulation with daughter or staff assist.  Will follow during acute stay.     Follow Up Recommendations No PT follow up    Equipment Recommendations  None recommended by PT    Recommendations for Other Services       Precautions / Restrictions Precautions Precautions: Fall Precaution Comments: watch BP Restrictions Weight Bearing Restrictions: No      Mobility  Bed Mobility Overal bed mobility: Modified Independent                Transfers Overall transfer level: Needs assistance Equipment used: Rolling walker (2 wheeled) Transfers: Sit to/from Stand Sit to Stand: Supervision            Ambulation/Gait Ambulation/Gait assistance: Supervision;Min assist Ambulation Distance (Feet): 250 Feet Assistive device: Rolling walker (2 wheeled) Gait Pattern/deviations: Step-through pattern;Decreased stride length;Trunk flexed     General Gait Details: initially assist to turn walker, due to used to rollator  Stairs Stairs: Yes Stairs assistance: Min guard Stair Management: One rail Left;Step to pattern;Sideways Number of Stairs: 3 General stair comments: cues for technique  Wheelchair Mobility    Modified Rankin (Stroke Patients Only)       Balance Overall balance assessment: Needs assistance   Sitting  balance-Leahy Scale: Good       Standing balance-Leahy Scale: Fair Standing balance comment: transitions to stair railing leaving walker against wall, no LOB                             Pertinent Vitals/Pain Pain Assessment: No/denies pain  Orthostatic VS for the past 24 hrs (Last 3 readings):  BP- Lying Pulse- Lying BP- Sitting Pulse- Sitting BP- Standing at 0 minutes Pulse- Standing at 0 minutes BP- Standing at 3 minutes Pulse- Standing at 3 minutes  06/18/17 1203 - - 123/45 112 122/55 87 - -          130      Home Living Family/patient expects to be discharged to:: Private residence Living Arrangements: Children Available Help at Discharge: Family;Available 24 hours/day Type of Home: House Home Access: Stairs to enter Entrance Stairs-Rails: Right Entrance Stairs-Number of Steps: 3 Home Layout: One level Home Equipment: Walker - 4 wheels;Walker - 2 wheels;Cane - single point;Grab bars - tub/shower;Hand held shower head;Bedside commode Additional Comments: Has life alert    Prior Function Level of Independence: Independent with assistive device(s)         Comments: was using cane, but recently back to walker     Hand Dominance        Extremity/Trunk Assessment        Lower Extremity Assessment Lower Extremity Assessment: Overall WFL for tasks assessed    Cervical / Trunk Assessment Cervical / Trunk Assessment: Kyphotic  Communication   Communication: No difficulties  Cognition Arousal/Alertness: Awake/alert Behavior During Therapy: WFL for tasks assessed/performed Overall Cognitive Status: History of  cognitive impairments - at baseline                                 General Comments: WFL for session; daughter reports tends to fidget with things and "piddle" and gets into trouble when not supervised      General Comments General comments (skin integrity, edema, etc.): daughter reports she takes pt with her to go do ceramics  with residents at nursing home and [facility]; pt enjoys socialization; has been through therapy in the past at outpatient Neuro; educated about Parkinson's goup, but too late in day for pt to tolerate attendance    Exercises     Assessment/Plan    PT Assessment Patient needs continued PT services  PT Problem List Decreased balance;Decreased knowledge of use of DME;Decreased mobility;Decreased safety awareness;Decreased activity tolerance       PT Treatment Interventions DME instruction;Therapeutic activities;Gait training;Therapeutic exercise;Patient/family education;Balance training;Functional mobility training;Stair training    PT Goals (Current goals can be found in the Care Plan section)  Acute Rehab PT Goals Patient Stated Goal: To return home PT Goal Formulation: With patient/family Time For Goal Achievement: 06/22/17 Potential to Achieve Goals: Good    Frequency Min 3X/week   Barriers to discharge        Co-evaluation               AM-PAC PT "6 Clicks" Daily Activity  Outcome Measure Difficulty turning over in bed (including adjusting bedclothes, sheets and blankets)?: A Little Difficulty moving from lying on back to sitting on the side of the bed? : A Little Difficulty sitting down on and standing up from a chair with arms (e.g., wheelchair, bedside commode, etc,.)?: A Little Help needed moving to and from a bed to chair (including a wheelchair)?: A Little Help needed walking in hospital room?: A Little Help needed climbing 3-5 steps with a railing? : A Little 6 Click Score: 18    End of Session Equipment Utilized During Treatment: Gait belt Activity Tolerance: Patient tolerated treatment well Patient left: in bed;with call bell/phone within reach;with family/visitor present   PT Visit Diagnosis: Other abnormalities of gait and mobility (R26.89)    Time: 7017-7939 PT Time Calculation (min) (ACUTE ONLY): 39 min   Charges:   PT Evaluation $PT Eval  Moderate Complexity: 1 Procedure PT Treatments $Gait Training: 8-22 mins $Self Care/Home Management: 8-22   PT G Codes:   PT G-Codes **NOT FOR INPATIENT CLASS** Functional Assessment Tool Used: AM-PAC 6 Clicks Basic Mobility Functional Limitation: Mobility: Walking and moving around Mobility: Walking and Moving Around Current Status (Q3009): At least 40 percent but less than 60 percent impaired, limited or restricted Mobility: Walking and Moving Around Goal Status [id number]): At least 20 percent but less than 40 percent impaired, limited or restricted    [place], [place] 06/18/2017   Reginia Naas 06/18/2017, 12:10 PM

## 2017-06-18 NOTE — Progress Notes (Signed)
  Echocardiogram 2D Echocardiogram has been performed.  Jennette Dubin 06/18/2017, 10:52 AM

## 2017-06-18 NOTE — Discharge Summary (Addendum)
Physician Discharge Summary  [name] QIW:[id number] DOB: [birth year] DOA: 06/17/2017  PCP: [name], MD  Admit date: 06/17/2017 Discharge date: 06/18/2017   Recommendations for Outpatient Follow-Up:  B12 follow up   Discharge Diagnosis:   Principal Problem:   Orthostatic hypotension Active Problems:   Parkinson's disease (HCC)   OSA (obstructive sleep apnea)   Generalized anxiety disorder   Coronary artery disease   Diabetes ([place])   Hyperlipidemia LDL goal <70   Type 2 diabetes mellitus with hemoglobin A1c goal of less than 7.5% (HCC)   Mild dementia   Weakness generalized   Chest tightness   Dyspnea   Edema of both legs   Discharge disposition:  Home.  Discharge Condition: Improved.  Diet recommendation: Low sodium, heart healthy  Wound care: None.   History of Present Illness:   [name] is a 76 y.o. female with medical history significant for Parkinson's, CAD status post stents 2002, diabetes, hypertension, dyslipidemia, mild dementia, anxiety presents emergency department from home with the chief complaint sudden shortness of breath and chest tightness.   Hospital Course by Problem:   Orthostatic hypotension.  -improved with IVF -could not tolerate ted hose in past   Diabetes. Serum glucose 144 on admission. home medications include metformin. -resume home meds  Generalized weakness.  B12 low- IM x 1 and then SL -outpatient B12 follow up  Obstructive sleep apnea. Does not use C Pap machine.  Generalized anxiety disorder. Appears stable at baseline -Continue home meds   Parkinson's disease.  -Patient evaluated by neurology last month. Progress note indicates a generalized worsening of disease process. Note indicates patient having more confusion difficulty with time and memory and hallucinations.  -Continue home meds -physical therapy- no follow up    Medical Consultants:    None.   Discharge Exam:   Vitals:   06/17/17  1938 06/18/17 0652  BP: (!) 144/56 140/60  Pulse: 75 76  Resp: 19 19  Temp: 97.6 F (36.4 C) 98.1 F (36.7 C)   Vitals:   06/17/17 1345 06/17/17 1414 06/17/17 1938 06/18/17 0652  BP: 124/73 125/63 (!) 144/56 140/60  Pulse: 70 74 75 76  Resp: (!) 21 20 19 19   Temp:  97.8 F (36.6 C) 97.6 F (36.4 C) 98.1 F (36.7 C)  TempSrc:  Oral Oral Oral  SpO2: 99% 100% 100% 98%  Weight:  82.7 kg (182 lb 5 oz)  82.1 kg (181 lb)  Height:  5\' 6"  (1.676 m)      Gen:  NAD- anxious to go home to cat   The results of significant diagnostics from this hospitalization (including imaging, microbiology, ancillary and laboratory) are listed below for reference.     Procedures and Diagnostic Studies:   Dg Chest 2 View  Result Date: 06/17/2017 CLINICAL DATA:  Acute onset of shortness of breath, worse with exertion. Initial encounter. EXAM: CHEST  2 VIEW COMPARISON:  Chest radiograph performed 07/07/2016 FINDINGS: The lungs are well-aerated and clear. There is no evidence of focal opacification, pleural effusion or pneumothorax. The heart is normal in size; the mediastinal contour is within normal limits. No acute osseous abnormalities are seen. Mild anterior bridging osteophytes are noted at the midthoracic spine. IMPRESSION: No acute cardiopulmonary process seen. Electronically Signed   By: Garald Balding M.D.   On: 06/17/2017 06:09     Labs:   Basic Metabolic Panel:  Recent Labs Lab 06/17/17 0519 06/17/17 1347 06/18/17 0540  NA 139  --  140  K 4.0  --  4.1  CL 103  --  105  CO2 26  --  27  GLUCOSE 144*  --  174*  BUN 23*  --  16  CREATININE 1.14*  --  0.87  CALCIUM 9.0  --  8.9  MG  --  1.7  --   PHOS  --  4.1  --    GFR Estimated Creatinine Clearance: 57.5 mL/min (by C-G formula based on SCr of 0.87 mg/dL). Liver Function Tests:  Recent Labs Lab 06/17/17 0519  AST 17  ALT <5*  ALKPHOS 79  BILITOT 0.5  PROT 6.9  ALBUMIN 3.6   No results for input(s): LIPASE, AMYLASE in  the last 168 hours. No results for input(s): AMMONIA in the last 168 hours. Coagulation profile No results for input(s): INR, PROTIME in the last 168 hours.  CBC:  Recent Labs Lab 06/17/17 0519  WBC 7.5  NEUTROABS 5.4  HGB 11.6*  HCT 36.3  MCV 89.6  PLT 264   Cardiac Enzymes:  Recent Labs Lab 06/17/17 1347  TROPONINI <0.03   BNP: Invalid input(s): POCBNP CBG:  Recent Labs Lab 06/17/17 1657 06/17/17 2214 06/18/17 0736 06/18/17 1202  GLUCAP 186* 105* 159* 119*   D-Dimer No results for input(s): DDIMER in the last 72 hours. Hgb A1c  Recent Labs  06/17/17 1347  HGBA1C 6.7*   Lipid Profile No results for input(s): CHOL, HDL, LDLCALC, TRIG, CHOLHDL, LDLDIRECT in the last 72 hours. Thyroid function studies  Recent Labs  06/17/17 1347  TSH 2.295   Anemia work up  Recent Labs  06/17/17 1347  [place] 188   Microbiology No results found for this or any previous visit (from the past 240 hour(s)).   Discharge Instructions:   Discharge Instructions    Diet - low sodium heart healthy    Complete by:  As directed    Discharge instructions    Complete by:  As directed    Would limit the PO lasix as can be dehydrating (weigh self daily if able and use that as a determinate for extra fluid)   Increase activity slowly    Complete by:  As directed      Allergies as of 06/18/2017      Reactions   Aspirin Anaphylaxis   swelling   Maxitrol [neomycin-polymyxin-dexameth] Itching, Other (See Comments)   Redness to the eye      Medication List    TAKE these medications   ALPRAZolam 0.5 MG tablet Commonly known as:  XANAX Take 0.25 mg by mouth at bedtime as needed for anxiety.   B-12 1000 MCG Subl Place 1,000 mcg under the tongue daily.   Carbidopa-Levodopa ER 23.75-95 MG Cpcr Commonly known as:  RYTARY Take 3-4 capsules by mouth See admin instructions. Take 4 capsules at 7AM, 3 capsules at 1PM, and 4 capsules 7PM What changed:  how much to  take  when to take this  additional instructions   furosemide 20 MG tablet Commonly known as:  LASIX Take 20 mg by mouth as needed for fluid.   metFORMIN 1000 MG tablet Commonly known as:  GLUCOPHAGE Take 1,000 mg by mouth 2 (two) times daily with a meal.   minocycline 50 MG capsule Commonly known as:  MINOCIN,DYNACIN Take 50 mg by mouth daily after lunch. Take at 1300   risperiDONE 0.5 MG tablet Commonly known as:  RISPERDAL Take 1 tablet (0.5 mg total) by mouth 2 (two) times daily. What changed:  additional instructions  rivastigmine 4.6 mg/24hr Commonly known as:  EXELON Place 1 patch (4.6 mg total) onto the skin daily. What changed:  when to take this   sertraline 50 MG tablet Commonly known as:  ZOLOFT Take 50 mg by mouth every evening.   tiZANidine 4 MG tablet Commonly known as:  ZANAFLEX TAKE 1 TABLET(4 MG) BY MOUTH EVERY 8 HOURS AS NEEDED FOR MUSCLE SPASMS   Vitamin D 2000 units tablet Take 2,000 Units by mouth daily after lunch.      Follow-up Information    [name], MD Follow up in 1 week(s).   Specialty:  Internal Medicine Contact information: [address] [place] [address] [phone]            Time coordinating discharge: 35 min  Signed:  Maryan Sivak Alison Stalling   [facility] 06/18/2017, 2:03 PM

## 2017-06-19 LAB — FOLATE RBC
Folate, Hemolysate: 369 ng/mL
Folate, RBC: 1063 ng/mL (ref >498)
Hematocrit: 34.7 % (ref 34.0–46.6)

## 2017-07-05 ENCOUNTER — Emergency Department (HOSPITAL_COMMUNITY)
Admission: EM | Admit: 2017-07-05 | Discharge: 2017-07-05 | Disposition: A | Discharge status: Home / Self Care | Payer: Medicare Other | Attending: Emergency Medicine | Admitting: Emergency Medicine

## 2017-07-05 ENCOUNTER — Encounter (HOSPITAL_COMMUNITY): Payer: Self-pay | Admitting: Emergency Medicine

## 2017-07-05 ENCOUNTER — Emergency Department (HOSPITAL_COMMUNITY): Payer: Medicare Other

## 2017-07-05 DIAGNOSIS — Z7984 Long term (current) use of oral hypoglycemic drugs: Secondary | ICD-10-CM | POA: Diagnosis not present

## 2017-07-05 DIAGNOSIS — G20 Parkinson's disease: Secondary | ICD-10-CM | POA: Diagnosis not present

## 2017-07-05 DIAGNOSIS — E119 Type 2 diabetes mellitus without complications: Secondary | ICD-10-CM | POA: Insufficient documentation

## 2017-07-05 DIAGNOSIS — I10 Essential (primary) hypertension: Secondary | ICD-10-CM | POA: Diagnosis not present

## 2017-07-05 DIAGNOSIS — R069 Unspecified abnormalities of breathing: Secondary | ICD-10-CM | POA: Diagnosis not present

## 2017-07-05 DIAGNOSIS — F0280 Dementia in other diseases classified elsewhere without behavioral disturbance: Secondary | ICD-10-CM | POA: Insufficient documentation

## 2017-07-05 DIAGNOSIS — R0602 Shortness of breath: Secondary | ICD-10-CM | POA: Diagnosis not present

## 2017-07-05 DIAGNOSIS — Z79899 Other long term (current) drug therapy: Secondary | ICD-10-CM | POA: Insufficient documentation

## 2017-07-05 DIAGNOSIS — R0789 Other chest pain: Secondary | ICD-10-CM | POA: Insufficient documentation

## 2017-07-05 LAB — BASIC METABOLIC PANEL
Anion gap: 11 (ref 5–15)
BUN: 20 mg/dL (ref 6–20)
CALCIUM: 8.9 mg/dL (ref 8.9–10.3)
CO2: 24 mmol/L (ref 22–32)
CREATININE: 1.11 mg/dL — AB (ref 0.44–1.00)
Chloride: 104 mmol/L (ref 101–111)
GFR calc Af Amer: 54 mL/min — ABNORMAL LOW (ref >60)
GFR, EST NON AFRICAN AMERICAN: 46 mL/min — AB (ref >60)
GLUCOSE: 168 mg/dL — AB (ref 65–99)
POTASSIUM: 4 mmol/L (ref 3.5–5.1)
SODIUM: 139 mmol/L (ref 135–145)

## 2017-07-05 LAB — CBC WITH DIFFERENTIAL/PLATELET
BASOS ABS: 0 10*3/uL (ref 0.0–0.1)
Basophils Relative: 0 %
EOS ABS: 0.1 10*3/uL (ref 0.0–0.7)
EOS PCT: 1 %
HCT: 35.7 % — ABNORMAL LOW (ref 36.0–46.0)
Hemoglobin: 11.4 g/dL — ABNORMAL LOW (ref 12.0–15.0)
LYMPHS PCT: 18 %
Lymphs Abs: 1.4 10*3/uL (ref 0.7–4.0)
MCH: 28.6 pg (ref 26.0–34.0)
MCHC: 31.9 g/dL (ref 30.0–36.0)
MCV: 89.7 fL (ref 78.0–100.0)
MONO ABS: 0.4 10*3/uL (ref 0.1–1.0)
Monocytes Relative: 6 %
Neutro Abs: 5.8 10*3/uL (ref 1.7–7.7)
Neutrophils Relative %: 75 %
PLATELETS: 331 10*3/uL (ref 150–400)
RBC: 3.98 MIL/uL (ref 3.87–5.11)
RDW: 14 % (ref 11.5–15.5)
WBC: 7.7 10*3/uL (ref 4.0–10.5)

## 2017-07-05 LAB — I-STAT TROPONIN, ED
TROPONIN I, POC: 0 ng/mL (ref 0.00–0.08)
Troponin i, poc: 0.01 ng/mL (ref 0.00–0.08)

## 2017-07-05 LAB — BRAIN NATRIURETIC PEPTIDE: B Natriuretic Peptide: 70.9 pg/mL (ref 0.0–100.0)

## 2017-07-05 MED ORDER — SODIUM CHLORIDE 0.9 % IV BOLUS (SEPSIS)
500.0000 mL | Freq: Once | INTRAVENOUS | Status: AC
  Administered 2017-07-05: 500 mL via INTRAVENOUS

## 2017-07-05 MED ORDER — OMEPRAZOLE 20 MG PO CPDR: 20.0000 mg | DELAYED_RELEASE_CAPSULE | Freq: Every day | ORAL | 0 refills | Status: DC

## 2017-07-05 MED ORDER — ALBUTEROL SULFATE (2.5 MG/3ML) 0.083% IN NEBU
5.0000 mg | INHALATION_SOLUTION | Freq: Once | RESPIRATORY_TRACT | Status: DC
  Filled 2017-07-05: qty 6

## 2017-07-05 NOTE — ED Provider Notes (Signed)
[facility] DEPT Provider Note   CSN: [id number] Arrival date & time: 07/05/17  7408     History   Chief Complaint Chief Complaint  Patient presents with  . Shortness of Breath    HPI [name] is a 76 y.o. female who presents with SOB. PMH significant for Parkinson's disease, mild dementia, Type 2 DM, CAD, GERD, HTN. She was admitted on 7/8-7/9 for orthostatic hypotension which improved after fluids. She states she lives with her daughter and was in her usual state of health this morning while sitting and drinking coffee. She drank about half a cup when she suddenly felt very short of breath and had a burning substernal chest pain. EMS was called and gave her a breathing tx. She states that the CP is resolved and SOB is much better but is still there.   Daughter at bedside states that the patient also has had issues with leg swelling. She is supposed to be on Lasix 1QD she is concerned because she became orthostatic when taking 1 pill and her legs become more weak.   HPI  Past Medical History:  Diagnosis Date  . Anxiety   . Coronary artery disease    Mid LAD Stent 2001 Patent By cath on 08/10/2010  . 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 Diabetes mellitus    Type II  . Dyslipidemia   . GERD (gastroesophageal reflux disease)   . HTN (hypertension)   . Parkinson disease [facility])     Patient Active Problem List   Diagnosis Date Noted  . Weakness generalized 06/17/2017  . Orthostatic hypotension 06/17/2017  . Chest tightness 06/17/2017  . Dyspnea 06/17/2017  . Edema of both legs 06/17/2017  . Mild dementia 11/03/2015  . Chest pain 10/03/2015  . Type 2 diabetes mellitus with hemoglobin A1c goal of less than 7.5% ([place]) 10/03/2015  . Hyperlipidemia LDL goal <70 09/25/2014  . Adjustment disorder with mixed anxiety and depressed mood 05/09/2013  . Parkinson's disease ([place]) 03/25/2013  . OSA (obstructive sleep apnea) 03/25/2013  . Generalized anxiety disorder 03/25/2013   . Coronary artery disease 03/25/2013  . Diabetes ([place]) 03/25/2013    Past Surgical History:  Procedure Laterality Date  . APPENDECTOMY    . CARDIAC CATHETERIZATION  07/02/2000   normal LV function, 40-50% prox smooth LAD stenosis between 1st and 2nd diagonal, normal Cfx & RCA (Dr. Corky Downs)  . CARDIAC CATHETERIZATION  02/20/2001   normal LV function, 50% stenosis prox to prox 3.5x34mm Nir Elite stent, 80% stenosis in a 1st septal perforating artery (Dr. Corky Downs)  . CARDIAC CATHETERIZATION  06/18/2006   patent LAD stent, dominant Cfx, low-normal EF (Dr. Domenic Moras)  . CARDIAC CATHETERIZATION  08/10/2010   patent mid LAD stent, nonobstructive CAD (Dr. Alma Friendly)  . CHOLECYSTECTOMY  1963  . CORONARY ANGIOPLASTY WITH STENT PLACEMENT  10/31/2000   single vessel CAD (mild degree in mid-LAD 60% stenosis) - predilatation of plaque in mid LAD with cutting balloon angioplasty; 3.0x60mm Nir Elite stent to mid-LAD (Dr. Domenic Moras)  . HYSTERECTOMY  1989  . NM MYOCAR PERF WALL MOTION  2012   lexiscan - normal perfusion, EF 71%, low risk  . TRANSTHORACIC ECHOCARDIOGRAM  2012   EF 60-65%, mild AV regurg, LA mildly dilated, PA peak pressure 51mmHg    OB History    No data available       Home Medications    Prior to Admission medications   Medication Sig Start Date End Date  Taking? Authorizing Provider  ALPRAZolam Duanne Moron) 0.5 MG tablet Take 0.25 mg by mouth at bedtime as needed for anxiety.     [provider]  Carbidopa-Levodopa ER (RYTARY) 23.75-95 MG CPCR Take 3-4 capsules by mouth See admin instructions. Take 4 capsules at 7AM, 3 capsules at 1PM, and 4 capsules 7PM 06/18/17   Eulogio Bear U, DO  Cholecalciferol (VITAMIN D) 2000 units tablet Take 2,000 Units by mouth daily after lunch.    [provider]  Cyanocobalamin (B-12) 1000 MCG SUBL Place 1,000 mcg under the tongue daily. 06/18/17   Geradine Girt, DO  furosemide (LASIX) 20 MG tablet Take 20 mg by mouth as needed for  fluid.    [provider]  metFORMIN (GLUCOPHAGE) 1000 MG tablet Take 1,000 mg by mouth 2 (two) times daily with a meal.    [provider]  minocycline (MINOCIN,DYNACIN) 50 MG capsule Take 50 mg by mouth daily after lunch. Take at 1300     [provider]  risperiDONE (RISPERDAL) 0.5 MG tablet Take 1 tablet (0.5 mg total) by mouth 2 (two) times daily. Patient taking differently: Take 0.5 mg by mouth 2 (two) times daily. 7am and 7pm 05/14/17   Penumalli, Earlean Polka, MD  rivastigmine (EXELON) 4.6 mg/24hr Place 1 patch (4.6 mg total) onto the skin daily. Patient taking differently: Place 4.6 mg onto the skin every evening.  05/14/17   Penumalli, Earlean Polka, MD  sertraline (ZOLOFT) 50 MG tablet Take 50 mg by mouth every evening.     [provider]  tiZANidine (ZANAFLEX) 4 MG tablet TAKE 1 TABLET(4 MG) BY MOUTH EVERY 8 HOURS AS NEEDED FOR MUSCLE SPASMS 05/15/17   Penumalli, Earlean Polka, MD    Family History Family History  Problem Relation Age of Onset  . Heart failure Father   . Parkinson's disease Father   . Stroke Mother   . Parkinson's disease Sister   . ALS Cousin     Social History Social History  Substance Use Topics  . Smoking status: Never Smoker  . Smokeless tobacco: Never Used  . Alcohol use No     Allergies   Aspirin and Maxitrol [neomycin-polymyxin-dexameth]   Review of Systems Review of Systems  Constitutional: Negative for fever.  Respiratory: Positive for shortness of breath.   Cardiovascular: Positive for chest pain and leg swelling.  Gastrointestinal: Positive for abdominal pain and constipation. Negative for nausea and vomiting.  Neurological: Positive for tremors and weakness (chronic leg weakness).  All other systems reviewed and are negative.    Physical Exam Updated Vital Signs BP (!) 134/58 (BP Location: Right Arm)   Pulse 83   Temp 98.1 F (36.7 C) (Oral)   Resp 17   Ht 5\' 6"  (1.676 m)   Wt 81.6 kg (180 lb)   SpO2 96%    BMI 29.05 kg/m   Physical Exam  Constitutional: She is oriented to person, place, and time. She appears well-developed and well-nourished. No distress.  HENT:  Head: Normocephalic and atraumatic.  Eyes: Pupils are equal, round, and reactive to light. Conjunctivae are normal. Right eye exhibits no discharge. Left eye exhibits no discharge. No scleral icterus.  Neck: Normal range of motion.  Cardiovascular: Normal rate and regular rhythm.  Exam reveals no gallop and no friction rub.   No murmur heard. Pulmonary/Chest: Effort normal and breath sounds normal. No respiratory distress. She has no wheezes. She has no rales. She exhibits no tenderness.  Abdominal: Soft. Bowel sounds are normal.  She exhibits no distension and no mass. There is no tenderness. There is no rebound and no guarding. No hernia.  Musculoskeletal:  Bilateral leg swelling  Neurological: She is alert and oriented to person, place, and time.  Resting tremor of L arm Bilateral foot drop  Skin: Skin is warm and dry.  Psychiatric: She has a normal mood and affect. Her behavior is normal.  Nursing note and vitals reviewed.    ED Treatments / Results  Labs (all labs ordered are listed, but only abnormal results are displayed) Labs Reviewed  BASIC METABOLIC PANEL - Abnormal; Notable for the following:       Result Value   Glucose, Bld 168 (*)    Creatinine, Ser 1.11 (*)    GFR calc non Af Amer 46 (*)    GFR calc Af Amer 54 (*)    All other components within normal limits  CBC WITH DIFFERENTIAL/PLATELET - Abnormal; Notable for the following:    Hemoglobin 11.4 (*)    HCT 35.7 (*)    All other components within normal limits  BRAIN NATRIURETIC PEPTIDE  I-STAT TROPONIN, ED  I-STAT TROPONIN, ED    EKG  EKG Interpretation  Date/Time:  Thursday July 05 2017 07:47:17 EDT Ventricular Rate:  84 PR Interval:    QRS Duration: 129 QT Interval:  395 QTC Calculation: 467 R Axis:   68 Text Interpretation:  Normal  sinus rhythm Nonspecific intraventricular conduction delay Abnormal T, consider ischemia, lateral leads Minimal ST elevation, lateral leads pt is very tremulous Confirmed by Isla Pence [phone]) on 07/05/2017 8:34:54 AM       Radiology Dg Chest 2 View  Result Date: 07/05/2017 CLINICAL DATA:  Shortness of breath for 2 days EXAM: CHEST  2 VIEW COMPARISON:  06/17/2017 FINDINGS: The heart size and mediastinal contours are within normal limits. Both lungs are clear. The visualized skeletal structures are unremarkable. IMPRESSION: No active cardiopulmonary disease. Electronically Signed   By: Kathreen Devoid   On: 07/05/2017 09:08    Procedures Procedures (including critical care time)  Medications Ordered in ED Medications  sodium chloride 0.9 % bolus 500 mL (0 mLs Intravenous Stopped 07/05/17 1311)     Initial Impression / Assessment and Plan / ED Course  I have reviewed the triage vital signs and the nursing notes.  Pertinent labs & imaging results that were available during my care of the patient were reviewed by me and considered in my medical decision making (see chart for details).  76 year old female with acute burning substernal chest pain and SOB which has resolved on arrival to ED. Seems atypical for ACS and sounds like reflux. CBC is remarkable for mild anemia. BMP remarkable for hyperglycemia and mildly elevated SCr. BNP is normal. 1st and 2nd trop is 0. EKG is unchanged. CXR negative. Discussed all results with daughter and patient. Shared visit with Dr. Gilford Raid. Will add Omeprazole 1QD and advised decreased Lasix to 1/2 pill every other day since she is becoming very orthostatic. Orthostatics were positive here in the ED and she was lightheaded. 500cc IVF given. Will d/c with return precautions.  Final Clinical Impressions(s) / ED Diagnoses   Final diagnoses:  SOB (shortness of breath)  Chest pain, atypical    New Prescriptions New Prescriptions   No medications on file      Iris Pert 07/05/17 [place], Julie, MD 07/05/17 1616

## 2017-07-05 NOTE — ED Triage Notes (Signed)
Pt arrives from home via [facility] reporting sudden onset SOB. Pt states, "I felt like my whole body was shutting down."  EMS reports pt initially 98%RA, gave 2.5 mg albuterol neb tx. Pt denies CP, n/v, reports similar episode 2 weeks ago, seen here.  Resp e/u on arrival, NAD noted at this time.

## 2017-07-05 NOTE — Discharge Instructions (Signed)
Take Omeprazole once a day for reflux Take 1/2 pill of Lasix every other day Talk to your doctor about starting physical therapy Return for worsening symptoms

## 2017-07-11 DIAGNOSIS — F418 Other specified anxiety disorders: Secondary | ICD-10-CM | POA: Diagnosis not present

## 2017-07-11 DIAGNOSIS — E1140 Type 2 diabetes mellitus with diabetic neuropathy, unspecified: Secondary | ICD-10-CM | POA: Diagnosis not present

## 2017-07-11 DIAGNOSIS — Z6828 Body mass index (BMI) 28.0-28.9, adult: Secondary | ICD-10-CM | POA: Diagnosis not present

## 2017-07-11 DIAGNOSIS — R443 Hallucinations, unspecified: Secondary | ICD-10-CM | POA: Diagnosis not present

## 2017-07-11 DIAGNOSIS — G20 Parkinson's disease: Secondary | ICD-10-CM | POA: Diagnosis not present

## 2017-07-11 DIAGNOSIS — G3183 Dementia with Lewy bodies: Secondary | ICD-10-CM | POA: Diagnosis not present

## 2017-07-11 DIAGNOSIS — F3289 Other specified depressive episodes: Secondary | ICD-10-CM | POA: Diagnosis not present

## 2017-07-11 DIAGNOSIS — E784 Other hyperlipidemia: Secondary | ICD-10-CM | POA: Diagnosis not present

## 2017-07-11 DIAGNOSIS — I10 Essential (primary) hypertension: Secondary | ICD-10-CM | POA: Diagnosis not present

## 2017-07-30 DIAGNOSIS — H0011 Chalazion right upper eyelid: Secondary | ICD-10-CM | POA: Diagnosis not present

## 2017-08-02 ENCOUNTER — Telehealth: Payer: Self-pay | Admitting: Diagnostic Neuroimaging

## 2017-08-02 NOTE — Telephone Encounter (Addendum)
Spoke with patient to inquire if she saw her pcp after  her hospital visits. She stated she saw Dr Brigitte Pulse following her admission in July, and she stated she was better at that time. She stated that prior to her going to ED she had "been shaking inside for one month". She stated she has to take a 1/2 xanax when she wakes up to "stop the shaking". She stated she feels she may need medication adjustments and wants to be seen sooner than her Dec follow up.  Advised her this RN will discuss with Dr [name] and call her this afternoon. Patient verbalized understanding, appreciation.

## 2017-08-02 NOTE — Telephone Encounter (Signed)
Patient called office in reference to having weakness in legs daily and panic attacks that come and go.  Patient is having to take xanax to help her calm down.  Symptoms have been present about 2 months.  She has already seen her PCP to discuss the panic attacks.  Patient was hospitalized two times in July.  She would like to discuss these problems and concerns she is having due to not knowing what to do anymore.  Pharmacy- [facility] [address].  Please call.

## 2017-08-02 NOTE — Telephone Encounter (Signed)
Spoke with patient and reviewed in detail Dr Gladstone Lighter reply. She wrote down instructions, stated she would buy  BP machine. She repeated instructions correctly. Scheduled her for early FU 08/15/17. She verbalized understanding, appreciation for call.

## 2017-08-02 NOTE — Telephone Encounter (Signed)
Re: orthostatic hypotension --> we can consider medications to help this. Patient should take BP and HR at home (laying and standing) several times per day, for next 1-2 weeks. Patient should bring log to office visit.  Re: anxiety --> managed by PCP; but we may need psychiatry consult.  Ok for earlier follow up with me to discuss this issues.    Penni Bombard, MD 01/28/2882, 3:74 PM Certified in Neurology, Neurophysiology and Neuroimaging  [facility] [address], [place], [place] [address] [phone]

## 2017-08-03 MED ORDER — CARBIDOPA-LEVODOPA ER 36.25-145 MG PO CPCR: 3.0000 | ORAL_CAPSULE | Freq: Three times a day (TID) | ORAL | 12 refills | Status: DC

## 2017-08-03 NOTE — Addendum Note (Signed)
Addended by: Andrey Spearman R on: 08/03/2017 12:25 PM   Modules accepted: Orders

## 2017-08-03 NOTE — Telephone Encounter (Signed)
Patients daughter [name] (listed on DPR) called office in reference to patient shaking inside and out and Dr. Brigitte Pulse has increased her Zoloft to 100mg  and taking xanax.  [name] states the shaking is from her Parkinson's and would like to see if there is a medication for her to help with this.  She is aware patient is having panic attacks and anxiety but states its from the shaking from the Parkinsons.  Pharmacy-  [facility].  Please call [name] to discuss.

## 2017-08-03 NOTE — Telephone Encounter (Signed)
Will change rytary dosing to 36.25/145 tabs --> take 3 tabs three times per day (7am, 1pm, 7pm).  Meds ordered this encounter  Medications  . Carbidopa-Levodopa ER (RYTARY) 36.25-145 MG CPCR    Sig: Take 3 capsules by mouth 3 (three) times daily.    Dispense:  270 capsule    Refill:  12    Fax to [facility] # [phone]   Penni Bombard, MD 7/79/3903, 00:92 PM Certified in Neurology, Neurophysiology and Neuroimaging  [facility] [address] [place], [place] [address] [phone]

## 2017-08-03 NOTE — Telephone Encounter (Signed)
Called and spoke with [name] and explained that since the patient has already been taken 4 tab at each time Dr Tish Frederickson suggest increasing the medication completely. I have new orders and I will fax to the [facility] so that they can get this processed. The daughter was concerned what to do in the meantime while waiting for the medication. Dr Tish Frederickson suggested that the patient take 5 tab in am 4 tab at lunch and 4 tab at dinner to help increase her amt until the medication comes thru then she will follow the instructions on the new medication. Pt daughter verbalized understanding.

## 2017-08-15 ENCOUNTER — Other Ambulatory Visit: Payer: Self-pay | Admitting: *Deleted

## 2017-08-15 ENCOUNTER — Ambulatory Visit (INDEPENDENT_AMBULATORY_CARE_PROVIDER_SITE_OTHER): Payer: Medicare Other | Admitting: Diagnostic Neuroimaging

## 2017-08-15 ENCOUNTER — Encounter: Payer: Self-pay | Admitting: Diagnostic Neuroimaging

## 2017-08-15 VITALS — BP 109/69 | HR 70 | Ht 66.0 in | Wt 183.2 lb

## 2017-08-15 DIAGNOSIS — F0390 Unspecified dementia without behavioral disturbance: Secondary | ICD-10-CM | POA: Diagnosis not present

## 2017-08-15 DIAGNOSIS — G20 Parkinson's disease: Secondary | ICD-10-CM | POA: Diagnosis not present

## 2017-08-15 DIAGNOSIS — R441 Visual hallucinations: Secondary | ICD-10-CM | POA: Diagnosis not present

## 2017-08-15 DIAGNOSIS — F411 Generalized anxiety disorder: Secondary | ICD-10-CM

## 2017-08-15 DIAGNOSIS — F03A0 Unspecified dementia, mild, without behavioral disturbance, psychotic disturbance, mood disturbance, and anxiety: Secondary | ICD-10-CM

## 2017-08-15 DIAGNOSIS — R269 Unspecified abnormalities of gait and mobility: Secondary | ICD-10-CM | POA: Diagnosis not present

## 2017-08-15 MED ORDER — RIVASTIGMINE 4.6 MG/24HR TD PT24: 4.6000 mg | MEDICATED_PATCH | Freq: Every evening | TRANSDERMAL | 4 refills | Status: DC

## 2017-08-15 MED ORDER — CARBIDOPA-LEVODOPA ER 36.25-145 MG PO CPCR: 3.0000 | ORAL_CAPSULE | Freq: Three times a day (TID) | ORAL | 4 refills | Status: AC

## 2017-08-15 MED ORDER — RISPERIDONE 0.5 MG PO TABS: 0.5000 mg | ORAL_TABLET | Freq: Two times a day (BID) | ORAL | 12 refills | Status: DC

## 2017-08-15 NOTE — Progress Notes (Signed)
[facility]  PATIENT: [name] DOB: January 12, 1941  REFERRING CLINICIAN:  HISTORY FROM: patient and daughter [name]) REASON FOR VISIT: follow up   HISTORICAL  CHIEF COMPLAINT:  Chief Complaint  Patient presents with  . Follow-up  . Parkinson's Disease    rytary increase working much better.    HISTORY OF PRESENT ILLNESS:   UPDATE 08/15/17: Since last visit, had hosp admission for SOB, then another ER visit for SOB. Memory loss continues. Then in Aug, more tremors and wearing off. Now on higher rytary dosing and doing much better with tremors and anxiety.  UPDATE 05/14/17: Since last visit, more confusion, diff with time, memory, hallucinations. Walking more affected. Diff with getting up and down from chair. Using cane for balance. Planning to go to [place] this summer for vacation. Delusions and hallucinations stable. Patient on risperidone 0.5mg  BID.   UPDATE 11/07/16: Since last visit, continues with scalp sensitivity (migratory). Has seen dermatology. Delusions are improved with risperidone. PD is otherwise stable. Now has moved out of her house and now living with 2 daughters (1 month at a time).   UPDATE 08/04/16: Since last visit, has had increasing hallucinations and delusions, went to [facility], now on risperidone 0.5mg  BID. Still with belief that she has some type of internal infection or parasite.   UPDATE 05/01/16: Since last visit, had dx of UTI and s/p abx treatment. Hallucinations continue. Now with hallucination/delusion that her cat has worms or some infection. Family took patient and cat to the vet, and no worms/infx found.   UPDATE 03/15/16: Since last visit, had progressive malaise, weakness, fatigue, chills, confusion, memory problems over past 1-2 months. In last few days, more severe worsening of generalized sxs. No infx signs. No focal weakness. PD stable, on rytary.  UPDATE 12/27/15: Since last visit, more visual hallucinations  (non-threatening). 1 close call fall into a chair.   UPDATE 09/20/15: Since last visit, more wearing off. Also having 1 week every 6 weeks of fatigue, malaise, tired feeling. Also noted a red scalp issue, new, and going to dermatology.   UPDATE 06/21/15: Pt reporting more fam hx of tremor (sister dx'd with PD recently; sister's son and his daughter dx'd with ET; 2 first cousins with ALS; patient's father had ET and PD). Doing well on rytary. Better tremor control. Better sleep. Pt and daughter planning to change living arrangement. Pt planning to sell her home and move in with daughter (after home renovations). Using cane at home. Had muscle spasms, better with heat pad and tizanidine. Now off muscle relaxer.   UPDATE 03/22/15: Since last visit, tried rytary with excellent results x 1 month; then cost was going to increase significantly. Now back to carb/levo 25/100 1.5/1.5/1.5/2 tabs. Better results over night, but not as good as with rytary. More right knee pain issues.  UPDATE 01/29/15: Since last visit patient having more problems with wearing off. She is taking her carbidopa/levodopa at 6 AM, noon, 4 PM and 7 PM. Patient feeling maximum wearing off symptoms mainly around 10 AM. She notes increasing tremor in arms, body, as well as internal tremulous feeling. Patient also having more problems with difficulty in urination, constipation, balance. Patient continues to live her on her own. Her daughter checks on her several times per week. Patient still able to do light housework. Patient not driving or shopping on her own. Daughter helps with these functions.  UPDATE 07/20/14: Since last visit, doing much better. Now exercising daily (30 minutes) on  a abike and with weights / bands. Also incr carb/levo is working better. Some occ wearing off, but pt can usually sense this and make small adjustments with timing / freq and this helps manage symptoms.   UPDATE 01/20/14: Since last visit, tried amantadine, but  it gave her flu-like symptoms, confusion, bad side effects. She took x 1 week then stopped. Few months a ago, also began to have intermittent visual hallucinations ("amish people, dogs, animals, children"), mainly at night. These are non-threatening, and she usually goes to sleep after seeing them. She usually can tell that they are not real. Anxiety is stable. Depression is still present. More balance difficulties. More wearing off of carb/levo (usually after 4 hours). Still driving. Not using cane/walker. Is going to PT for exercises.  UPDATE 10/08/13: Since last visit, patient's husband passed away, and patient struggled with significant grieving and stress. She's finally come to terms and is doing better with mood and cognitive ability. Patient continues on carbidopa/levodopa, one and half tablets in the morning, one half tablets at noon, half tablet at 3 PM and 2 tablets in the evening. Patient is very attuned to her on off fluctuations and wearing off and is able to adjust her medications on daily basis to manage this. Her mood and anxiety have improved. She's taking Effexor 75 mg at bedtime and lorazepam 0.5 mg tabs, 2 tablets at bedtime. This controls her symptoms quite well. Patient's daughter has noted that tremor is slightly increasing over time. Patient also feels spasms in her abdominal region.  UPDATE 03/25/13: since last visit patient's tremor, balance and gait have worsened. Her memory is slightly worse. She's also becoming very obsessive compulsive. She has difficulty with going to the bathroom and may sit on the toilet for 1 or 2 hours at a time. She's become obsessed with the timing and scheduling going to the bathroom.  UPDATE 06/24/12: Now complaining of right leg pain (behind knee) severe pain since Sunday. Also intermittent since past 4-6 weeks. Started before starting exelon patch. Some cramps in her thighs. No back pain. Increased carb/levo is helping. Not tried clonazepam yet. Exelon  patch seems to help her cognitive symptoms.   UPDATE 05/13/12: Cognitive problems, navigation abilities, perseveration and short term memory loss now more problematic.Seems to be progressing at rapid pace per family. Poor sleep at night. More anxiety at night. Carb/levo working, but tends to wear off before next dose. Now takes 4 tabs per day (with meals + 1 qhs). Yesterday had visual hallucination (saw a small boy outside near her husband).   UPDATE 01/10/12: Doing much better on carb/levo; tremor, gait, and mental abilities better.  No wearing off during day.  Sometimes wakes up in middle of night (2-3am) and has shaking, tremor, cannot fall asleep again.  Has snoring and daytime sleepiness.   PRIOR HPI (11/23/11): 76 year old right-handed female with history of diabetes, coronary artery disease, anxiety, care for hospital followup after possible transient ischemic attack. 10/31/11 - patient woke up, feeling confused, unable to organize and express her thoughts. Her daughter came to see her and was able to understand her, but the patient seemed frustrated and confused and unable to express what she was trying to say. At some point the patient developed some slurred speech and increased tremor. She was taken to the emergency room on 11/01/11 at 12:40am, and enrolled in the TIA protocol.  CT, MRI, carotid u/s and TTE done.  Labs done.  Patient discharged. For past 10 years, patient has  had progressive postural tremor. Also has had intermittent episodes of internal sensation of restlessness and anxiety.  She has been treated with primidone since August 2012, without relief of symptoms. She is also on Effexor and Xanax for anxiety and depression.  Patient's daughter has noted progressive memory loss, confusional episodes over the last several years.  Balance and walking deteriorating.  REVIEW OF SYSTEMS: Full 14 system review of systems performed and negative except: weakness confusion anxiety leg swelling  hearing loss fatigue memory loss dizziness.    ALLERGIES: Allergies  Allergen Reactions  . Aspirin Anaphylaxis    swelling  . Maxitrol [Neomycin-Polymyxin-Dexameth] Itching and Other (See Comments)    Redness to the eye    HOME MEDICATIONS: Outpatient Medications Prior to Visit  Medication Sig Dispense Refill  . ALPRAZolam (XANAX) 0.25 MG tablet Take 0.25 mg by mouth 2 (two) times daily as needed for anxiety. Take 1/2- 1 tab twice daily as needed    . Carbidopa-Levodopa ER (RYTARY) 36.25-145 MG CPCR Take 3 capsules by mouth 3 (three) times daily. 270 capsule 12  . Cholecalciferol (VITAMIN D) 2000 units tablet Take 2,000 Units by mouth daily after lunch.    . Cyanocobalamin (B-12) 1000 MCG SUBL Place 1,000 mcg under the tongue daily. 30 each 0  . furosemide (LASIX) 20 MG tablet Take 20 mg by mouth daily as needed for fluid.     . metFORMIN (GLUCOPHAGE) 1000 MG tablet Take 1,000 mg by mouth 2 (two) times daily with a meal.    . risperiDONE (RISPERDAL) 0.5 MG tablet Take 1 tablet (0.5 mg total) by mouth 2 (two) times daily. (Patient taking differently: Take 0.5 mg by mouth 2 (two) times daily. 7am and 7pm) 60 tablet 12  . rivastigmine (EXELON) 4.6 mg/24hr Place 1 patch (4.6 mg total) onto the skin daily. (Patient taking differently: Place 4.6 mg onto the skin every evening. ) 90 patch 4  . sertraline (ZOLOFT) 50 MG tablet Take 50 mg by mouth every evening.     Marland Kitchen tiZANidine (ZANAFLEX) 4 MG tablet TAKE 1 TABLET(4 MG) BY MOUTH EVERY 8 HOURS AS NEEDED FOR MUSCLE SPASMS 270 tablet 3  . omeprazole (PRILOSEC) 20 MG capsule Take 1 capsule (20 mg total) by mouth daily. (Patient not taking: Reported on 08/15/2017) 30 capsule 0   No facility-administered medications prior to visit.     PAST MEDICAL HISTORY: Past Medical History:  Diagnosis Date  . Anxiety   . Coronary artery disease    Mid LAD Stent 2001 Patent By cath on 08/10/2010  . 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 Diabetes mellitus      Type II  . Dyslipidemia   . GERD (gastroesophageal reflux disease)   . HTN (hypertension)   . Parkinson disease ([place])     PAST SURGICAL HISTORY: Past Surgical History:  Procedure Laterality Date  . APPENDECTOMY    . CARDIAC CATHETERIZATION  07/02/2000   normal LV function, 40-50% prox smooth LAD stenosis between 1st and 2nd diagonal, normal Cfx & RCA (Dr. Corky Downs)  . CARDIAC CATHETERIZATION  02/20/2001   normal LV function, 50% stenosis prox to prox 3.5x40mm Nir Elite stent, 80% stenosis in a 1st septal perforating artery (Dr. Corky Downs)  . CARDIAC CATHETERIZATION  06/18/2006   patent LAD stent, dominant Cfx, low-normal EF (Dr. Domenic Moras)  . CARDIAC CATHETERIZATION  08/10/2010   patent mid LAD stent, nonobstructive CAD (Dr. Alma Friendly)  . CHOLECYSTECTOMY  1963  . CORONARY ANGIOPLASTY WITH STENT  PLACEMENT  10/31/2000   single vessel CAD (mild degree in mid-LAD 60% stenosis) - predilatation of plaque in mid LAD with cutting balloon angioplasty; 3.0x43mm Nir Elite stent to mid-LAD (Dr. Domenic Moras)  . HYSTERECTOMY  1989  . NM MYOCAR PERF WALL MOTION  2012   lexiscan - normal perfusion, EF 71%, low risk  . TRANSTHORACIC ECHOCARDIOGRAM  2012   EF 60-65%, mild AV regurg, LA mildly dilated, PA peak pressure 57mmHg    FAMILY HISTORY: Family History  Problem Relation Age of Onset  . Heart failure Father   . Parkinson's disease Father   . Stroke Mother   . Parkinson's disease Sister   . ALS Cousin     SOCIAL HISTORY:  Social History   Social History  . Marital status: Widowed    Spouse name: N/A  . Number of children: 3  . Years of education: 12   Occupational History  . Retired    Social History Main Topics  . Smoking status: Never Smoker  . Smokeless tobacco: Never Used  . Alcohol use No  . Drug use: No  . Sexual activity: Not on file   Other Topics Concern  . Not on file   Social History Narrative   08/04/16 PT living with dgtrs now   Caffeine Use- 3 to 4 cups daily      PHYSICAL EXAM  Vitals:   08/15/17 0940  BP: 109/69  Pulse: 70  Weight: 183 lb 3.2 oz (83.1 kg)  Height: 5\' 6"  (1.676 m)   Wt Readings from Last 3 Encounters:  08/15/17 183 lb 3.2 oz (83.1 kg)  07/05/17 180 lb (81.6 kg)  06/18/17 181 lb (82.1 kg)   Body mass index is 29.57 kg/m.  EXAM: General: Patient is CALM, awake, alert and in no acute distress.  Well developed and groomed. Cardiovascular: No carotid artery bruits.  Heart is regular rate and rhythm with no murmurs.  Neurologic Exam  Mental Status: Awake, alert. Language is fluent and comprehension intact. Cranial Nerves: MASKED FACIES. Pupils are equal and reactive to light.  Visual fields are full to confrontation.  Conjugate eye movements are full, saccadic .  Ptosis on the left.  Facial sensation and strength are symmetric.  Hearing is intact to finger rub.  Palate elevated symmetrically and uvula is midline.  Shoulder shrug is symmetric.  Tongue is midline. Tremor at the soft pallate.  Motor: RARE LUE > RUE MILD REST TREMOR. MILD POSTURAL TREMOR OF BUE.  MILD HEAD TREMOR.  MILD COGWHEELING WITH REINFORCEMENT.  MILD BRADYKINESIA IN LUE > RUE, AND BLE. Normal bulk. Full strength in the upper and lower extremities.  No pronator drift. Sensory: Intact and symmetric to light touch. Coordination: No ataxia or dysmetria on finger-nose or rapid alternating movement testing. Gait and Station: SLOW TO STAND, WALK, TURN. LEFT HAND TREMOR; RIGHT HAND USING CANE.   DIAGNOSTIC DATA (LABS, IMAGING, TESTING) - I reviewed patient records, labs, notes, testing and imaging myself where available.  Lab Results  Component Value Date   WBC 7.7 07/05/2017   HGB 11.4 (L) 07/05/2017   HCT 35.7 (L) 07/05/2017   MCV 89.7 07/05/2017   PLT 331 07/05/2017      Component Value Date/Time   NA 139 07/05/2017 0808   NA 143 03/15/2016 1638   K 4.0 07/05/2017 0808   CL 104 07/05/2017 0808   CO2 24 07/05/2017 0808   GLUCOSE 168 (H)  07/05/2017 0808   BUN 20 07/05/2017 5188  BUN 19 03/15/2016 1638   CREATININE 1.11 (H) 07/05/2017 0808   CALCIUM 8.9 07/05/2017 0808   PROT 6.9 06/17/2017 0519   PROT 7.2 03/15/2016 1638   ALBUMIN 3.6 06/17/2017 0519   ALBUMIN 4.3 03/15/2016 1638   AST 17 06/17/2017 0519   ALT <5 (L) 06/17/2017 0519   ALKPHOS 79 06/17/2017 0519   BILITOT 0.5 06/17/2017 0519   BILITOT <0.2 03/15/2016 1638   GFRNONAA 46 (L) 07/05/2017 0808   GFRAA 54 (L) 07/05/2017 0808   Lab Results  Component Value Date   CHOL 208 (H) 11/01/2011   HDL 54 11/01/2011   LDLCALC 122 (H) 11/01/2011   TRIG 158 (H) 11/01/2011   CHOLHDL 3.9 11/01/2011   Lab Results  Component Value Date   HGBA1C 6.7 (H) 06/17/2017   Lab Results  Component Value Date   VITAMINB12 188 06/17/2017   Lab Results  Component Value Date   TSH 2.295 06/17/2017    11/01/11 CT head  - Mild white matter hypodensities, a nonspecific finding often secondary to chronic microangiopathic change.  No acute intracranial abnormality identified by CT.  11/01/11 MRI brain - No acute intracranial abnormality.  Minimal to mild for age nonspecific white matter signal changes.    ASSESSMENT AND PLAN  76 y.o. 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 here with Parkinson's disease.   Had been doing much better with rytary with tremor control, but then having more hallucinations/delusions (now better on risperidone). Still with balance issues and fall risk.   Also reports strong fam hx of essential tremor, PD, and ALS. Could represent a genetic/familial form of PD for patient.   Meds tried and failed: carbidopa/levodopa, effexor   Dx:  Parkinson's disease ([place])  Mild dementia  Hallucinations, visual  Abnormality of gait  Generalized  anxiety disorder    PLAN:  PARKINSON'S DZ (established problem, worsening) - continue rytary (36.25/145) to 3 caps three times per day - continue exelon patch - caution with balance, fall risk --> use walker - consider lift chair  DELUSIONS/PSYCHOSIS (established problem, worsening) - continue risperidone 0.5mg  twice a day  MUSCLE SPASMS (established problem, stable) - continue tizanidine as needed  ANXIETY (established problem, stable) - continue sertraline and xanax as needed (per PCP)  Meds ordered this encounter  Medications  . rivastigmine (EXELON) 4.6 mg/24hr    Sig: Place 1 patch (4.6 mg total) onto the skin every evening.    Dispense:  90 patch    Refill:  4  . risperiDONE (RISPERDAL) 0.5 MG tablet    Sig: Take 1 tablet (0.5 mg total) by mouth 2 (two) times daily. 7am and 7pm    Dispense:  60 tablet    Refill:  12  . Carbidopa-Levodopa ER (RYTARY) 36.25-145 MG CPCR    Sig: Take 3 capsules by mouth 3 (three) times daily.    Dispense:  810 capsule    Refill:  4    Fax to [facility] # [phone]   Return in about 4 months (around 12/15/2017).     Penni Bombard, MD 2/0/2542, 70:62 AM Certified in Neurology, Neurophysiology and Neuroimaging  [facility] [address] [place], [place] [address] [phone]

## 2017-08-15 NOTE — Telephone Encounter (Signed)
Resubmitted to [facility] mail order pharmacy.

## 2017-08-16 NOTE — Progress Notes (Signed)
Fax confirmation received for rytary [phone].

## 2017-08-22 NOTE — Telephone Encounter (Signed)
Pt is using [facility] for rytary.  They received fax from another pharmacy.  They needed a prescription from [place].  Faxed to [phone] with fax confirmation.

## 2017-08-28 ENCOUNTER — Telehealth: Payer: Self-pay | Admitting: Diagnostic Neuroimaging

## 2017-08-28 NOTE — Telephone Encounter (Signed)
I called and spoke to daughter.  Pt is taking her rytary 36.25-145mg  (3 tabs at 0600-1300-2000).  Had noted doing well and since the last 1-2 wks has been noted to having dizziness/ lightheadedness and increased shaking (this can happen anytime, but mostly noted in am).  Bp's are good.  ? Needing adjustment.  Please advise.

## 2017-08-28 NOTE — Telephone Encounter (Signed)
No further adjustment at this time time. -VRP

## 2017-08-28 NOTE — Telephone Encounter (Signed)
Pt daughter (which is who pt is currently living with) is calling re: the Carbidopa-Levodopa ER (RYTARY) 36.25-145 MG CPCR, she states it was working for the 1st week or 2 but now not so much.  Pt daughter asking for a call back re: if medication she be increased or not please call

## 2017-08-29 NOTE — Telephone Encounter (Signed)
Spoke to daughter who pt lives with [name]) that Dr. [name] would like to keep things as is for now and monitor.  [name] will call back in a couple of weeks and let us know how her mother is doing.

## 2017-09-14 ENCOUNTER — Other Ambulatory Visit: Payer: Self-pay | Admitting: Internal Medicine

## 2017-09-14 DIAGNOSIS — Z1231 Encounter for screening mammogram for malignant neoplasm of breast: Secondary | ICD-10-CM

## 2017-09-18 NOTE — Telephone Encounter (Signed)
No change in rytary at this time. Follow up with PCP re: generalized weakness and sore throat / swollen glands to rule out infx. [facility].   -VRP

## 2017-09-18 NOTE — Telephone Encounter (Signed)
Pt's daughter [name]) called said she is weak all over, dizzy, medication is wearing off too early. Please call to discuss at [phone]

## 2017-09-18 NOTE — Telephone Encounter (Signed)
Called to speak to [facility];  she requested this RN speak to patient. Patient stated that her issues started 2 weeks ago and are getting worse. She reported her "head is so dizzy, and her  balance off so bad". She also stated she is "really weak". She stated these symptoms have gotten worse. She also stated that 2 weeks she had a sore throat and  swollen glands. She stated her throat is no longer sore, but her glands are swollen in her neck. This RN advised she should contact her PCP about swollen glands. Patient stated she feels her medication is causing her dizziness, balance issues and weakness. She is asking if she can go back to lower dose. This RN advised Dr [name] would have to make that decision. Advised she contact her PCP if she does have swollen glands. Patient verbalized understanding.  [name] requested to speak with this RN again. [name] stated this is the first she's heard of a sore throat and swollen glands. She stated that she feels it is unrelated to her mother's issues. [name] did agree to discuss swollen glands with patient and possibly call PCP. [name] is asking if medication needs to be stronger or if it is too strong. She is sometimes giving patient extra Xanax in morning and evening to help control her tremors. She is asking for Dr Gladstone Lighter advise. This RN stated will inform Dr Tish Frederickson and call back . [name] verbalized understanding, appreciation.

## 2017-09-19 ENCOUNTER — Encounter: Payer: Self-pay | Admitting: Diagnostic Neuroimaging

## 2017-09-19 DIAGNOSIS — R591 Generalized enlarged lymph nodes: Secondary | ICD-10-CM | POA: Diagnosis not present

## 2017-09-19 DIAGNOSIS — I10 Essential (primary) hypertension: Secondary | ICD-10-CM | POA: Diagnosis not present

## 2017-09-19 DIAGNOSIS — D6489 Other specified anemias: Secondary | ICD-10-CM | POA: Diagnosis not present

## 2017-09-19 DIAGNOSIS — E1140 Type 2 diabetes mellitus with diabetic neuropathy, unspecified: Secondary | ICD-10-CM | POA: Diagnosis not present

## 2017-09-19 DIAGNOSIS — R443 Hallucinations, unspecified: Secondary | ICD-10-CM | POA: Diagnosis not present

## 2017-09-19 DIAGNOSIS — G3183 Dementia with Lewy bodies: Secondary | ICD-10-CM | POA: Diagnosis not present

## 2017-09-19 NOTE — Telephone Encounter (Signed)
LVM informing patient and daughter that Dr [name] will not make any medication changes at this time. She was doing well on current dose when he last saw her. Advised he may consider sending her to an academic center for a second opinion as well. He also recommends she see her PCP to FU on past sore throat, swollen glands to rule out infection. Left number for questions.

## 2017-10-01 ENCOUNTER — Encounter: Payer: Self-pay | Admitting: Diagnostic Neuroimaging

## 2017-10-05 ENCOUNTER — Ambulatory Visit: Payer: Self-pay

## 2017-10-15 ENCOUNTER — Encounter: Payer: Self-pay | Admitting: Diagnostic Neuroimaging

## 2017-10-15 DIAGNOSIS — G20 Parkinson's disease: Secondary | ICD-10-CM

## 2017-10-17 NOTE — Telephone Encounter (Signed)
Will start nuplazid via start form (stop risperidone when nuplazid starts). Also refer for psychiatry consult. -VRP

## 2017-10-19 NOTE — Telephone Encounter (Signed)
I received fax confirmation re: to Nuplazid enrollment.  (I called and spoke to whitney, did not have to have pt/poa sign).  [phone], [phone] ofv.

## 2017-10-22 ENCOUNTER — Telehealth: Payer: Self-pay | Admitting: Diagnostic Neuroimaging

## 2017-10-23 ENCOUNTER — Encounter: Payer: Self-pay | Admitting: Psychology

## 2017-10-23 NOTE — Telephone Encounter (Signed)
I received yesterday a pt coverage summary.  Pt needs PA for this medication (it is a covered med)

## 2017-10-24 ENCOUNTER — Telehealth: Payer: Self-pay | Admitting: Diagnostic Neuroimaging

## 2017-10-24 DIAGNOSIS — G20 Parkinson's disease: Secondary | ICD-10-CM

## 2017-10-24 NOTE — Telephone Encounter (Addendum)
Received and called # for PA , referred me to covermy meds or there website. I inititated PA on cover my meds. Key [id number].  (could be up to 72 hours for answer).

## 2017-10-24 NOTE — Telephone Encounter (Signed)
Hailey with [employer] is calling regarding PA needed for Nuplazid and to confirm patient coverage summary was received. The number to call for PA is [phone].

## 2017-10-25 ENCOUNTER — Encounter: Payer: Self-pay | Admitting: Psychology

## 2017-10-25 ENCOUNTER — Ambulatory Visit (INDEPENDENT_AMBULATORY_CARE_PROVIDER_SITE_OTHER): Payer: Medicare Other | Admitting: Psychology

## 2017-10-25 DIAGNOSIS — F0281 Dementia in other diseases classified elsewhere with behavioral disturbance: Secondary | ICD-10-CM

## 2017-10-25 DIAGNOSIS — F02818 Dementia in other diseases classified elsewhere, unspecified severity, with other behavioral disturbance: Secondary | ICD-10-CM

## 2017-10-25 DIAGNOSIS — G20 Parkinson's disease: Secondary | ICD-10-CM

## 2017-10-25 NOTE — Telephone Encounter (Signed)
I wanted to confirm that once pt receives nuplazid she is to stop the risperdal 1mg  po bid (no taper) and then start nuplazid.

## 2017-10-25 NOTE — Telephone Encounter (Signed)
Correct. Once nuplazid starts, then patient should stop risperidal. -VRP

## 2017-10-25 NOTE — Telephone Encounter (Signed)
I called and spoke to [place] at [facility] she said pt enrollment still in insurance verification.  She could not do anything more until it had gone thru there processes. (this was regarding Nuplazid).   I spoke to [place], and she stated that they had been trying to contact with daughter about qualifying for copay assistance.  I will call daughter.   I called and spoke to her and pt was able to get into neuro psychologist , MB Heath at [facility].  [name], pts daughter requested a referral to Dr. Carles Collet after meeting with MB Bailer-Heath.  [name] stated she liked having all services in one place. (neuro psych-movement d/o spec-sw).  Pt was afraid of upsetting Dr. [name].  I relayed that I did not think this was a problem to do referral, as we refer pts to other neurologists as needed.  She was appreciative of what we have done.  Will hold off now on nuplazid.

## 2017-10-25 NOTE — Telephone Encounter (Signed)
I placed referral. -VRP

## 2017-10-25 NOTE — Progress Notes (Signed)
NEUROPSYCHOLOGICAL INTERVIEW (CPT: [id number])  Name: [name] Date of Birth: 11-30-1941 Date of Interview: 10/25/2017  Reason for Referral:  [name] is a 76 y.o. female who is referred for neuropsychological evaluation by Dr. Marton Cannon of [place] due to concerns about memory loss, hallucinations and delusions. This patient is accompanied in the office by her daughter, [name], who supplements the history.  History of Presenting Problem:  [name] has a history of Parkinson's disease with parkinsonism dating back to approximately 2002. She has been seeing Dr. Leta Cannon of [place] for PD since the end of 2012 (it is noted that postural tremor had been worsening over the 10 years prior to his initial consultation with the patient). She has demonstrated worsening memory loss over the years. She has never had previous neurocognitive evaluation. She has also had worsening hallucinations and delusions, which started around 2014 and initially were not that troubling (seeing children and animals outside). However, in the past couple of years she has had disturbing hallucinations (appears visual and tactile) of worms in her cat, then on her own skin, and then in her body. She was hospitalized for hallucinations and delusions in summer 2017 and put on risperidone. She has not had the hallucinations of worms but other hallucinations are worsening. She sees spiders and other large insects as well as people climbing into the window. The hallucinations are increasing despite increased dosage of risperidone. Her daughter would like to have her medications evaluated to determine if she is on the best ones for her symptoms. She stated she has heard "mixed reviews" from different providers and caregivers about risperidone. She takes Rytary for her physical symptoms and this appears to be helping but again the patient's daughter wonders if there are any other medications that might be more helpful.  Her daughter also mentions that the patient has been told that she has Lewy Body dementia at some point in the past and they would like clarification on this.  With regard to memory and cognitive functioning, the patient herself denies any concerns upon direct questioning. She denies all cognitive symptoms I present except she does endorse word finding difficulties and possible visual-spatial perception problems. Her daughter, on the other hand, reports short term memory loss with forgetfulness for recent conversations and events, repetition of statements and questions, misplacing/losing items, difficulty concentrating, communication changes (forgets what she is going to say, also reduced comprehension), and visual-spatial navigation problems (even gets somewhat disoriented to where things are in her daughter's home). [name] noted that prior to the patient giving up driving, she would get lost coming home from church which was 5 miles from her home (this started several years ago).  The patient is widowed and was living independently up until her hospitalization in 2017. After that, she moved out of her home and was going back and forth between her children's homes for a month at a time. She is now settled with [name] and will be staying there full-time. She no longer manages any instrumental ADLs independently. Her daughters manage and administer her medications. They manage her bills/finances and appointments. They manage her transportation. She has done some cooking with her daughter and did well with this but she cannot use the stove on her own as she will forget that it is on.  The patient was an avid reader and crossword puzzle solver until 4 months ago when she stopped doing both activities. She stated she has tried to do them but just  can't. She does want to be doing some activity though so they got out a large jigsaw puzzle and she has been working on that.  Ms. Cannon has a history of anxiety  predating her PD. She reported she continues to feel anxious a lot. She denies depressed mood but describes her mood as "terrible a lot of the time". She endorses passive suicidal ideation (not wanting to be alive anymore at times) but denies active intention or plan.  Physically, she is most bothered by internal tremors. She uses a cane to ambulate but her mobility is pretty stable per her daughter. She has not had any falls.  She has sleep difficulty and awakens frequently with hallucinations which disturb her. She does not demonstrate symptoms of REM sleep behavior disorder.  Her appetite is good. She has diabetes but her daughter stopped her metformin as the patient's A1c has been fine and she was having some negative reactions to the medication.  Of note, there is a strong family history of essential tremor, PD, and ALS.   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 She has three children (2 daughters and a son), 8 grands and 1 great-grand. Alcohol: None Tobacco: Never   Medical History: Past Medical History:  Diagnosis Date  . Anxiety   . Coronary artery disease    Mid LAD Stent 2001 Patent By cath on 08/10/2010  . 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 Diabetes mellitus    Type II  . Dyslipidemia   . GERD (gastroesophageal reflux disease)   . HTN (hypertension)   . Parkinson disease [facility])      Current Medications:  Outpatient Encounter Medications as of 10/25/2017  Medication Sig  . ALPRAZolam (XANAX) 0.25 MG tablet Take 0.25 mg by mouth 2 (two) times daily as needed for anxiety. Take 1/2- 1 tab twice daily as needed  . Carbidopa-Levodopa ER (RYTARY) 36.25-145 MG CPCR Take 3 capsules by mouth 3 (three) times daily.  . Cholecalciferol (VITAMIN D) 2000  units tablet Take 2,000 Units by mouth daily after lunch.  . Cyanocobalamin (B-12) 1000 MCG SUBL Place 1,000 mcg under the tongue daily.  . furosemide (LASIX) 20 MG tablet Take 20 mg by mouth daily as needed for fluid.   . metFORMIN (GLUCOPHAGE) 1000 MG tablet Take 1,000 mg by mouth 2 (two) times daily with a meal.  . risperiDONE (RISPERDAL) 0.5 MG tablet Take 1 tablet (0.5 mg total) by mouth 2 (two) times daily. 7am and 7pm  . rivastigmine (EXELON) 4.6 mg/24hr Place 1 patch (4.6 mg total) onto the skin every evening.  . sertraline (ZOLOFT) 50 MG tablet Take 50 mg by mouth every evening.   Marland Kitchen tiZANidine (ZANAFLEX) 4 MG tablet TAKE 1 TABLET(4 MG) BY MOUTH EVERY 8 HOURS AS NEEDED FOR MUSCLE SPASMS   No facility-administered encounter medications on file as of 10/25/2017.      Behavioral Observations:   Appearance: Neatly, casually and appropriately dressed and groomed Gait: Ambulated with a cane, slow gait Speech: Fluent; reduced rate. Observed mild to moderate word finding difficulty. Observed difficulty expressing thoughts. Thought process: Appears linear Affect: Flat (Masked facies) Interpersonal: Pleasant, appropriate   TESTING: There is medical necessity to proceed with neuropsychological assessment as the results will be used to aid in differential diagnosis and clinical decision-making and to  inform specific treatment recommendations. Per the patient, her daughter and medical records reviewed, there has been a change in cognitive functioning and a reasonable suspicion of dementia due to PD.  Following the clinical interview, the patient completed a full battery of neuropsychological testing with my psychometrician under my supervision.   PLAN: The patient will return to see me for a follow-up session at which time her test performances and my impressions and treatment recommendations will be reviewed in detail.  Full report to follow.

## 2017-10-25 NOTE — Addendum Note (Signed)
Addended by: Andrey Spearman R on: 10/25/2017 05:53 PM   Modules accepted: Orders

## 2017-10-25 NOTE — Telephone Encounter (Signed)
Received Prior approval for Nuplazid 34mg  po daily via cover my meds.   Effective 10/24/17 thru 10/24/18. [facility] ([place]) [id number] MCR Part D. [phone].

## 2017-10-25 NOTE — Progress Notes (Signed)
   Neuropsychology Note  [name] came today for 2 hours of neuropsychological testing with technician, [name], BS, under the supervision of Dr. Macarthur Critchley. The patient did not appear overtly distressed by the testing session, per behavioral observation or via self-report to the technician. Rest breaks were offered. [name] will return within 2 weeks for a feedback session with [name] at which time her test performances, clinical impressions and treatment recommendations will be reviewed in detail. The patient understands she can contact our office should she require our assistance before this time.  Full report to follow.

## 2017-10-26 ENCOUNTER — Telehealth: Payer: Self-pay | Admitting: Psychology

## 2017-10-26 NOTE — Telephone Encounter (Signed)
Tc with patients daughter, [name]. She needs information on LTC Medicaid and parkinson's support and resources.  She will be coming into the office independently on 11/19 to get some information.

## 2017-10-29 ENCOUNTER — Encounter: Payer: Self-pay | Admitting: Psychology

## 2017-10-29 ENCOUNTER — Other Ambulatory Visit: Payer: Self-pay

## 2017-10-29 NOTE — Progress Notes (Signed)
I met with patient's daughter today for about 45 minutes and provided her with information on parkinson's resources and dementia resources as requested.   LTC medicaid information from a eldercare attorney. I advised she seek council on specific questions with this information.  Support Groups for Parkinson's Patient's  New Caregiver Support group flier  Dementia- [facility] 24/7 information  Articles for caregivers on dementia behaviors   In home care support  [facility]- [facility]  [facility] PACE services   

## 2017-11-12 ENCOUNTER — Ambulatory Visit: Payer: Medicare Other | Admitting: Diagnostic Neuroimaging

## 2017-11-12 NOTE — Progress Notes (Signed)
[name] was seen today in the movement disorders clinic for neurologic consultation at the request of [name], MD and Dr. [name].  The records that were made available to me were reviewed.  The consultation is for the evaluation of PD.  This patient is accompanied in the office by her daughter who supplements the history.    Patient notes from 2012 indicate that she had a 10-year history at that time of tremor and had been treated with primidone.  She also was reporting confusion and progressive memory loss in 2012.  She was being treated for anxiety and depression as well.  In 2013, she was placed on carbidopa levodopa, and records indicate that the initiation of this was beneficial.  By 2014, the patient was describing motor fluctuations.  At the end of 2014, she was placed on amantadine.  She felt she had side effects of confusion with this medication.  She did not take it for long.  When she came back and followed up in early 2015, she reported that she had had some hallucinations.  It is unclear if these occurred when she was on amantadine.  In February, 2016, she was transitioned to Rytary 95 mg, 3 tablets 3 times per day.  The dosage was increased to 4 tablets 3 times per day in October, 2016 until she complained about more visual hallucinations and this was backed down again to 3 tablets 3 times a day in January, 2017.  Nuplazid was started in May, 2017 for the same.  She never took that.  She ultimately was hospitalized and was placed on Risperdal in Aug, 2017.  Her rytary was increased this year to 145 mg, 3 po tid (6am/2pm/10pm). she saw Dr. Si Raider and had neurocognitive testing on October 25, 2017.  She has not returned yet for results, but I did talk to Dr. Si Raider about those.  She has  mild to moderate dementia.  She also has anxiety and depression.  Specific Symptoms:  Tremor: Yes.   (L arm mostly, occasionally on the R arm.  Also inner tremor) Family hx of similar:  Yes.    - father, sister with PD Voice: softer Sleep: sleeping well  Vivid Dreams:  No.  Acting out dreams:  No. Wet Pillows: No. Postural symptoms:  Yes.    Falls?  No. Bradykinesia symptoms: slow movements and difficulty getting out of a chair Loss of smell:  Yes.   Loss of taste:  Yes.   Urinary Incontinence:  Yes.  , wears depends Difficulty Swallowing:  rare Handwriting, micrographia: Yes.   Trouble with ADL's:  Yes.    Trouble buttoning clothing: Yes.   Depression:  No. per patient; yes per daughter Memory changes:  Yes.   (lives with daughter since 07/2016) Hallucinations:  Yes.   (still having these - all sorts of children, bugs - she occasionally thinks that they are real.  Happen day and night.  Has called 911 because of these.  Daughter would like her off of Risperdal but would like to transition her to something else with)  visual distortions: Yes.   N/V:  No. Lightheaded:  Yes.    Syncope: No. Diplopia:  No. Dyskinesia:  No.  Neuroimaging of the brain has previously been performed.  CT brain done in 2017 showed mild small vessel disease.  PREVIOUS MEDICATIONS:  amantadine, primidone, carbidopa/levodopa, rytary  ALLERGIES:   Allergies  Allergen Reactions  . Aspirin Anaphylaxis    swelling  . Maxitrol [  Neomycin-Polymyxin-Dexameth] Itching and Other (See Comments)    Redness to the eye    CURRENT MEDICATIONS:  Outpatient Encounter Medications as of 11/13/2017  Medication Sig  . ALPRAZolam (XANAX) 0.25 MG tablet Take 0.25 mg by mouth 2 (two) times daily as needed for anxiety. Take 1/2- 1 tab twice daily as needed  . Carbidopa-Levodopa ER (RYTARY) 36.25-145 MG CPCR Take 3 capsules by mouth 3 (three) times daily.  . risperiDONE (RISPERDAL) 0.5 MG tablet Take 1 tablet (0.5 mg total) by mouth 2 (two) times daily. 7am and 7pm  . rivastigmine (EXELON) 4.6 mg/24hr Place 1 patch (4.6 mg total) onto the skin every evening.  . sertraline (ZOLOFT) 100 MG tablet Take 100 mg by mouth  daily.  Marland Kitchen tiZANidine (ZANAFLEX) 4 MG tablet TAKE 1 TABLET(4 MG) BY MOUTH EVERY 8 HOURS AS NEEDED FOR MUSCLE SPASMS  . [DISCONTINUED] Cholecalciferol (VITAMIN D) 2000 units tablet Take 2,000 Units by mouth daily after lunch.  . [DISCONTINUED] Cyanocobalamin (B-12) 1000 MCG SUBL Place 1,000 mcg under the tongue daily.  . [DISCONTINUED] furosemide (LASIX) 20 MG tablet Take 20 mg by mouth daily as needed for fluid.   . [DISCONTINUED] metFORMIN (GLUCOPHAGE) 1000 MG tablet Take 1,000 mg by mouth 2 (two) times daily with a meal.  . [DISCONTINUED] Pimavanserin Tartrate (NUPLAZID) 34 MG CAPS Take daily by mouth.  . [DISCONTINUED] sertraline (ZOLOFT) 50 MG tablet Take 50 mg by mouth every evening.    No facility-administered encounter medications on file as of 11/13/2017.     PAST MEDICAL HISTORY:   Past Medical History:  Diagnosis Date  . Anxiety   . Coronary artery disease    Mid LAD Stent 2001 Patent By cath on 08/10/2010  . 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 Diabetes mellitus    Type II  . Dyslipidemia   . GERD (gastroesophageal reflux disease)   . HTN (hypertension)   . Parkinson disease ([place])     PAST SURGICAL HISTORY:   Past Surgical History:  Procedure Laterality Date  . APPENDECTOMY    . CARDIAC CATHETERIZATION  07/02/2000   normal LV function, 40-50% prox smooth LAD stenosis between 1st and 2nd diagonal, normal Cfx & RCA (Dr. Corky Downs)  . CARDIAC CATHETERIZATION  02/20/2001   normal LV function, 50% stenosis prox to prox 3.5x23mm Nir Elite stent, 80% stenosis in a 1st septal perforating artery (Dr. Corky Downs)  . CARDIAC CATHETERIZATION  06/18/2006   patent LAD stent, dominant Cfx, low-normal EF (Dr. Domenic Moras)  . CARDIAC CATHETERIZATION  08/10/2010   patent mid LAD stent, nonobstructive CAD (Dr. Alma Friendly)  . CHOLECYSTECTOMY  1963  . CORONARY ANGIOPLASTY WITH STENT PLACEMENT  10/31/2000   single vessel CAD (mild degree in mid-LAD 60% stenosis) - predilatation of plaque in mid  LAD with cutting balloon angioplasty; 3.0x73mm Nir Elite stent to mid-LAD (Dr. Domenic Moras)  . HYSTERECTOMY  1989  . NM MYOCAR PERF WALL MOTION  2012   lexiscan - normal perfusion, EF 71%, low risk  . TRANSTHORACIC ECHOCARDIOGRAM  2012   EF 60-65%, mild AV regurg, LA mildly dilated, PA peak pressure 44mmHg    SOCIAL HISTORY:   Social History   Socioeconomic History  . Marital status: Widowed    Spouse name: Not on file  . Number of children: 3  . Years of education: 4  . Highest education level: Not on file  Social Needs  . Financial resource strain: Not on file  . Food insecurity - worry:  Not on file  . Food insecurity - inability: Not on file  . Transportation needs - medical: Not on file  . Transportation needs - non-medical: Not on file  Occupational History  . Occupation: Retired  Tobacco Use  . Smoking status: Never Smoker  . Smokeless tobacco: Never Used  Substance and Sexual Activity  . Alcohol use: No  . Drug use: No  . Sexual activity: Not on file  Other Topics Concern  . Not on file  Social History Narrative   08/04/16 PT living with dgtrs now   Caffeine Use- 3 to 4 cups daily    FAMILY HISTORY:   Family Status  Relation Name Status  . Father  Deceased at age 52  . Mother  14       78yrs old  . Sister  Alive  . Cousin first x 2-sisters Deceased  . Other nephew Alive  . Daughter 2 Alive  . Son  Alive    ROS:  A complete 10 system review of systems was obtained and was unremarkable apart from what is mentioned above.  PHYSICAL EXAMINATION:    VITALS:   Vitals:   11/13/17 0934  BP: 100/70  Pulse: 78  SpO2: 98%  Weight: 186 lb (84.4 kg)  Height: 5\' 6"  (1.676 m)    GEN:  The patient appears stated age and is in NAD. HEENT:  Normocephalic, atraumatic.  The mucous membranes are moist. The superficial temporal arteries are without ropiness or tenderness. CV:  RRR Lungs:  CTAB Neck/HEME:  There are no carotid bruits bilaterally.  Neurological  examination:  Orientation: The patient is alert and oriented to person and place.  MoCA deferred given just had neurocognitive testing performed. Cranial nerves: There is good facial symmetry. Pupils are equal round and reactive to light bilaterally.  Fundoscopic exam is attempted but the disc margins are not well visualized bilaterally.   Extraocular muscles are intact. The visual fields are full to confrontational testing. The speech is fluent and clear. Soft palate rises symmetrically and there is no tongue deviation. Hearing is intact to conversational tone. Sensation: Sensation is intact to light and pinprick throughout (facial, trunk, extremities). Vibration is intact at the bilateral big toe. There is no extinction with double simultaneous stimulation. There is no sensory dermatomal level identified. Motor: Strength is 5/5 in the bilateral upper and lower extremities.   Shoulder shrug is equal and symmetric.  There is no pronator drift. Deep tendon reflexes: Deep tendon reflexes are 2-/4 at the bilateral biceps, triceps, brachioradialis, patella and achilles. Plantar responses are downgoing bilaterally.  Movement examination: Tone: There is mild increased tone in the LUE tremor.  There is normal tone on the right.   Abnormal movements: There is LUE resting tremor that increases with distraction.   Coordination:  There is decremation with RAM's, with any form of RAMS, including alternating supination and pronation of the forearm, hand opening and closing, finger taps, heel taps and toe taps. Gait and Station: The patient has difficulty arising out of a deep-seated chair without the use of the hands.  She makes 2 unsuccessful attempts.  She then pushes off of the chair and has mild difficulty even arising then.  The patient stride length is slightly decreased.  She is unstable and nearly falls in the turn, but the examiner caught her.   She was given a walker and her gait markedly  improved. ASSESSMENT/PLAN:  1.  Idiopathic Parkinson's disease.  The patient was diagnosed in approximately  2013  -We discussed the diagnosis as well as pathophysiology of the disease.  We discussed treatment options as well as prognostic indicators.  Patient education was provided.  -We discussed that it used to be thought that levodopa would increase risk of melanoma but now it is believed that Parkinsons itself likely increases risk of melanoma. she is to get regular skin checks.  -Greater than 50% of the 80 minute visit was spent in counseling answering questions and talking about what to expect now as well as in the future.  We talked about medication options.  We talked about safety in the home.  -She looks fairly optimally treated with rytary 145 mg, 3 tablets 3 times per day.  She takes the last one at bedtime.  When she tried to move that closer up in the day, she had tremor at night that was awakening her.  She will continue with this medication.  Risks, benefits, side effects and alternative therapies were discussed.  The opportunity to ask questions was given and they were answered to the best of my ability.  The patient expressed understanding and willingness to follow the outlined treatment protocols.  -I will refer the patient [facility], for PT/OT and ST.  We talked about the importance of safe, cardiovascular exercise in Parkinson's disease.  -We discussed community resources in the area including patient support groups and community exercise programs for PD and pt education was provided to the patient.  I think that she would do well with the ACT program and recommended that to her today   2.  Parkinson's disease dementia with hallucinations  -Patient had neurocognitive testing in November, 2018.  This was consistent with mild to moderate dementia.  -If at all possible, Seroquel is likely a better agent than Risperdal for the treatment  of the hallucinations.  I do see that Dr. [name] tried to give the patient Nuplazid, but she did not take those and then went to an urgent care and was prescribed Risperdal and this was discontinued.  Risperdal can make symptoms of Parkinson's worse because of D2 receptor blockade.  I talked to the patient and her daughter about this today and her daughter, a nurse, very much wanted to change the Risperdal, mostly because she felt it was not helping and that a dose increase would be necessary.  I was not convinced that the Risperdal was making Parkinson's worse right now, but it certainly could with a dose increase.  Her daughter would like to try and change to Seroquel.  The other benefit of Seroquel is that it could help her sleep, and her daughter states that sleep (or lack thereof) is a huge problem right now.  I will try to change her to Seroquel and hold the Risperdal.  We will just start with nighttime dosages until she can get used to it.  She will take 50 mg, half a tablet at night.  Will call them in a few weeks to see how doing.  We did talk about the fact that the atypical antipsychotic medications are not indicated for dementia related psychosis and increased risk of mortality in the elderly, usually because of infectious or  cardiac related. Understanding is expressed and they were agreeable that the benefits outweigh the risks in this case.  -Patient is on the Exelon patch. She will continue that  -recommend patient meet with our PD social worker.  Daughter already has  3.  Anxiety and depression  -  I think that counseling would be of value.  This has been long-standing, according to records.  I agree with Dr. [name] that psychiatry could also be of value.  Discussed this with patient and her daughter today.  4.   I encouraged the patient and her daughter to follow back up with Dr. [name] as previously scheduled.  They have an appointment in January.  5.  Time spent with the patient,  as described above, did not include the 40 minutes spent in record review, which was non-face-to-face time.  Cc:  [name], MD

## 2017-11-13 ENCOUNTER — Ambulatory Visit (INDEPENDENT_AMBULATORY_CARE_PROVIDER_SITE_OTHER): Payer: Medicare Other | Admitting: Neurology

## 2017-11-13 ENCOUNTER — Encounter: Payer: Self-pay | Admitting: Neurology

## 2017-11-13 VITALS — BP 100/70 | HR 78 | Ht 66.0 in | Wt 186.0 lb

## 2017-11-13 DIAGNOSIS — G20A1 Parkinson's disease without dyskinesia, without mention of fluctuations: Secondary | ICD-10-CM

## 2017-11-13 DIAGNOSIS — G20 Parkinson's disease: Secondary | ICD-10-CM | POA: Diagnosis not present

## 2017-11-13 DIAGNOSIS — F0280 Dementia in other diseases classified elsewhere without behavioral disturbance: Secondary | ICD-10-CM

## 2017-11-13 DIAGNOSIS — F330 Major depressive disorder, recurrent, mild: Secondary | ICD-10-CM

## 2017-11-13 DIAGNOSIS — R441 Visual hallucinations: Secondary | ICD-10-CM | POA: Diagnosis not present

## 2017-11-13 MED ORDER — QUETIAPINE FUMARATE 50 MG PO TABS: 25.0000 mg | ORAL_TABLET | Freq: Every day | ORAL | 0 refills | Status: DC

## 2017-11-13 NOTE — Patient Instructions (Signed)
1. You have been referred to [facility] for physical, occupational, and speech therapy. They will call you directly to schedule an appointment.  Please call [phone] if you do not hear from them.   2. Stop Risperdol.  Start Seroquel 50 mg - 1/2 tablet at night. We will call in a couple weeks to see how you are doing.

## 2017-11-21 ENCOUNTER — Telehealth: Payer: Self-pay | Admitting: Neurology

## 2017-11-21 ENCOUNTER — Ambulatory Visit: Payer: Self-pay

## 2017-11-21 NOTE — Telephone Encounter (Signed)
Patient's daughter [name]) called regarding her mom's Seroquel medication. She said her [employer] is needing more Clinical information faxed into them. Her hallucinations are getting worse and Insurance will not approve. She uses [facility]. Please Call. Thanks

## 2017-11-21 NOTE — Telephone Encounter (Signed)
[name] aware this has been sent in.

## 2017-11-21 NOTE — Progress Notes (Signed)
NEUROPSYCHOLOGICAL EVALUATION   Name:    [name]  Date of Birth:   [birth year] Date of Interview:  10/25/2017 Date of Testing:  10/25/2017   Date of Feedback:  11/22/2017       Background Information:  Reason for Referral:  [name] is a 76 y.o. female referred by Dr. [name] of [place] to assess her current level of cognitive functioning and assist in differential diagnosis. The current evaluation consisted of a review of available medical records, an interview with the patient and her daughter, [name], and the completion of a neuropsychological testing battery. Informed consent was obtained.  History of Presenting Problem:  [name] has a history of Parkinson's disease with parkinsonism dating back to approximately 2002. She has been seeing Dr. [name] of [place] for PD since the end of 2012 (it is noted that postural tremor had been worsening over the 10 years prior to his initial consultation with the patient). She has demonstrated worsening memory loss over the years. She has never had previous neurocognitive evaluation. She has also had worsening hallucinations and delusions, which started around 2014 and initially were not that troubling (seeing children and animals outside). However, in the past couple of years she has had disturbing hallucinations (appears visual and tactile) of worms in her cat, then on her own skin, and then in her body. She was hospitalized for hallucinations and delusions in summer 2017 and put on risperidone. She has not had the hallucinations of worms but other hallucinations are worsening. She sees spiders and other large insects as well as people climbing into the window. The hallucinations are increasing despite increased dosage of risperidone. Her daughter requested a referral to Dr. Carles Collet from Dr. Brigitte Pulse to have the patient's medications evaluated to determine if she is on the best ones for her symptoms. [name] was concerned about  the patient being on risperidone. [name] also wanted to know if Rytary is the best treatment for her physical symptoms. [name] also mentioned that the patient has been told that she has Lewy Body dementia at some point in the past and they would like clarification on this.  The patient saw Dr. Carles Collet for neurologic consultation on 11/13/2017. Dr. Carles Collet felt she looked fairly optimally treated with Rytary 145 mg, 3 tablets 3 times per day. That will be continued. Dr. Carles Collet referred the patient to the PD program at [facility] for PT/OT and ST. The ACT program was also recommended to her. Risperdal was changed to Seroquel 50 mg, half tablet at night, but insurance has not yet approved that so as of right now she is still taking risperidone. Her daughter met with the PD program social worker.  With regard to memory and cognitive functioning, during our clinical interview on 10/25/2017, the patient herself denies any concerns upon direct questioning. She denies all cognitive symptoms I present except she does endorse word finding difficulties and possible visual-spatial perception problems. Her daughter, on the other hand, reports short term memory loss with forgetfulness for recent conversations and events, repetition of statements and questions, misplacing/losing items, difficulty concentrating, communication changes (forgets what she is going to say, also reduced comprehension), and visual-spatial navigation problems (even gets somewhat disoriented to where things are in her daughter's home). [name] noted that prior to the patient giving up driving, she would get lost coming home from church which was 5 miles from her home (this started several years ago).  The patient is widowed and was  living independently up until her hospitalization in 2017. After that, she moved out of her home and was going back and forth between her children's homes for a month at a time. She is now settled with [name] and will be  staying there full-time. She no longer manages any instrumental ADLs independently. Her daughters manage and administer her medications. They manage her bills/finances and appointments. They manage her transportation. She has done some cooking with her daughter and did well with this but she cannot use the stove on her own as she will forget that it is on.  The patient was an avid reader and crossword puzzle solver until 4 months ago when she stopped doing both activities. She stated she has tried to do them but just can't. She does want to be doing some activity though so they got out a large jigsaw puzzle and she has been working on that.  Ms. Keeny has a history of anxiety predating her PD. She reported she continues to feel anxious a lot. She denies depressed mood but describes her mood as "terrible a lot of the time". She endorses passive suicidal ideation (not wanting to be alive anymore at times) but denies active intention or plan.  Physically, she is most bothered by internal tremors. She uses a cane to ambulate but her mobility is pretty stable per her daughter. She has not had any falls.  She has sleep difficulty and awakens frequently with hallucinations which disturb her. She does not demonstrate symptoms of REM sleep behavior disorder.  Her appetite is good. She has diabetes but her daughter stopped her metformin as the patient's A1c has been fine and she was having some negative reactions to the medication.  Of note, there is a strong family history of essential tremor, PD, and ALS.  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 She has three children (2 daughters and a son), 8 grands and 1 great-grand. Alcohol: None Tobacco: Never   Medical History:  Past  Medical History:  Diagnosis Date  . Anxiety   . Coronary artery disease    Mid LAD Stent 2001 Patent By cath on 08/10/2010  . Dementia in Parkinson's disease ([place])   . Diabetes mellitus    Type II  . Dyslipidemia   . GERD (gastroesophageal reflux disease)   . HTN (hypertension)   . Parkinson disease [facility])     Current medications:  Outpatient Encounter Medications as of 11/22/2017  Medication Sig  . ALPRAZolam (XANAX) 0.25 MG tablet Take 0.25 mg by mouth 2 (two) times daily as needed for anxiety. Take 1/2- 1 tab twice daily as needed  . Carbidopa-Levodopa ER (RYTARY) 36.25-145 MG CPCR Take 3 capsules by mouth 3 (three) times daily.  . QUEtiapine (SEROQUEL) 50 MG tablet Take 0.5 tablets (25 mg total) by mouth at bedtime. May titrate higher in two weeks  . risperiDONE (RISPERDAL) 0.5 MG tablet Take 1 tablet (0.5 mg total) by mouth 2 (two) times daily. 7am and 7pm  . rivastigmine (EXELON) 4.6 mg/24hr Place 1 patch (4.6 mg total) onto the skin every evening.  . sertraline (ZOLOFT) 100 MG tablet Take 100 mg by mouth daily.  Marland Kitchen tiZANidine (ZANAFLEX) 4 MG tablet TAKE 1 TABLET(4 MG) BY MOUTH EVERY 8 HOURS AS NEEDED FOR MUSCLE SPASMS   No facility-administered encounter medications  on file as of 11/22/2017.    No longer taking Risperdal (this was changed to seroquel)  Current Examination:  Behavioral Observations:  Appearance: Neatly, casually and appropriately dressed and groomed Gait: Ambulated with a cane, slow gait Speech: Fluent; reduced rate. Observed mild to moderate word finding difficulty. Observed difficulty expressing thoughts. Thought process: Appears linear Affect: Flat (Masked facies) Interpersonal: Pleasant, appropriate Orientation: Oriented to person, place (city), and current month. Disoriented to date, year ("2020"), and day of the week.  Accurately named the current President but inaccurately named predecessor as Doctor, general practice.  Tests Administered: . Test of Premorbid  Functioning (TOPF) . Wechsler Adult Intelligence Scale-Fourth Edition (WAIS-IV): Similarities, Music therapist, Coding and Digit Span subtests . Wechsler Memory Scale-Fourth Edition (WMS-IV) Older Adult Version (ages 70-90): Logical Memory I, II and Recognition subtests  . [employer] Verbal Learning Test - 2nd Edition (CVLT-2) Short Form . Repeatable Battery for the Assessment of Neuropsychological Status (RBANS) Form A:  Figure Copy and Recall subtests and Semantic Fluency subtest . Boston Naming Test (BNT) . Boston Diagnostic Aphasia Examination: Complex Ideational Material subtest . Controlled Oral Word Association Test (COWAT) . Trail Making Test A and B . Clock drawing test . Symbol Digit Modalities Test (SDMT) . Geriatric Depression Scale (GDS) 15 Item . Generalized Anxiety Disorder - 7 item screener (GAD-7) . Parkinson's Disease Questionnaire (PDQ-39)  Test Results: Note: Standardized scores are presented only for use by appropriately trained professionals and to allow for any future test-retest comparison. These scores should not be interpreted without consideration of all the information that is contained in the rest of the report. The most recent standardization samples from the test publisher or other sources were used whenever possible to derive standard scores; scores were corrected for age, gender, ethnicity and education when available.   Test Scores:  Test Name Raw Score Standardized Score Descriptor  TOPF 33/70 SS= 95 Average  WAIS-IV Subtests     Similarities 11/36 ss= 5 Borderline  Block Design 6/66 ss= 3 Impaired  Coding 23/135 ss= 6 Low average  Digit Span Forward 8/16 ss= 8 Low end of average  Digit Span Backward 7/16 ss= 9 Average  WMS-IV Subtests     LM I 19/53 ss= 6 Low average  LM II 11/39 ss= 8 Low end of average  LM II Recognition 11/23 Cum %: <2 Impaired  RBANS Subtests     Figure Copy 15/20 Z= -1.6 Borderline  Figure Recall 7/20 Z= -1.3 Low average    Semantic Fluency 9 Z= -2.1 Impaired  CVLT-II Scores     Trial 1 3/9 Z= -2.5 Impaired  Trial 4 6/9 Z= -1.5 Borderline  Trials 1-4 total 19/36 T= 35 Borderline  SD Free Recall 2/9 Z= -2 Impaired  LD Free Recall 0/9 Z= -2 Impaired  LD Cued Recall 3/9 Z= -2 Impaired  Recognition Discriminability 6/9 hits, 3 false positives Z= -1.5 Borderline  Forced Choice Recognition 9/9  WNL  BNT 41/60 T= 36 Borderline  BDAE Subtest     Complex Ideational Material 7/12  Impaired  COWAT-FAS 34 T= 43 Average  COWAT-Animals 9 T= 28 Impaired  Trail Making Test A  145" 0 errors T= -5 Severely impaired  Trail Making Test B  Pt unable     Clock Drawing   Impaired  SDMT     Written 6/110 Z= -3.3 Severely impaired  Oral 7/110 Z= -3.4 Severely impaired  GDS-15 6/15  Mild  GAD-7 4/21  WNL  PDQ-39     Mobility  70%    Activities of Daily Living 37.5%    Emotional Well Being 41.6%    Stigma 0    Social Support 8.3%    Cognitive Impairment 56.25%    Communication 16.6%    Bodily Discomfort 25%        Description of Test Results:  Premorbid verbal intellectual abilities were estimated to have been within the average range based on a test of word reading. Psychomotor processing speed was low average and did not improve even when the motor component was removed. Auditory attention and working memory were average. Visual-spatial construction was impaired. Language abilities assessed, including confrontation naming, semantic fluency and auditory comprehension of complex ideational material, were all impaired. With regard to verbal memory, encoding and acquisition of non-contextual information (i.e., word list) was borderline impaired. After a brief distracter task, free recall was impaired (2/9 items recalled). After a delay, free recall was impaired (0/9 items recalled). Cued recall was impaired (3/9 items recalled). Performance on a yes/no recognition task was impaired. On another verbal memory test, encoding  and acquisition of contextual auditory information (i.e., short stories) was low average. After a delay, free recall was low end of average. Performance on a yes/no recognition task was impaired. With regard to non-verbal memory, delayed free recall of visual information was low average. Performance across tasks measuring various aspects of executive functioning was variable but largely below expectation. Mental flexibility and set-shifting were severely impaired; she was unable to complete Trails B. Verbal fluency with phonemic search restrictions was average. Verbal abstract reasoning was borderline impaired. Performance on a clock drawing task was impaired. On a self-report measure of mood, the patient's responses were indicative of at least mild depression at the present time. On a self-report measure of anxiety, the patient did not endorse clinically significant generalized anxiety at the present time. On a self-report measure assessing for the impact of PD symptoms on quality of life and daily functioning, the patient endorsed significant difficulty with mobility and cognitive impairment. She also endorsed moderately reduced emotional well-being and some bodily discomfort. She denied experiencing any stigma associated with PD, and she endorsed strong social support.   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 Results of cognitive testing reveal significant cognitive impairment in several domains of cognitive function. Additionally, there is evidence that her cognitive deficits are interfering with her ability to perform complex tasks such as drive and manage finances and medications. As such, diagnostic criteria for a dementia syndrome are met.   Her cognitive profile indicates the most prominent impairments in Control and instrumentation engineer, executive functioning, language (semantic retrieval) and processing  speed. She also demonstrates verbal memory impairment consistent with disruption of frontal-subcortical networks (ie, reduced encoding for non-contextual versus contextual information, retrieval benefits from cueing and multiple choice). Areas of relative strength are basic attention, free recall of contextual information, and phonemic fluency, but overall her cognitive testing results suggest rather global deficits in cognitive function. I would characterize her dementia as at least mild, more likely moderate, stage, due to the level of cognitive impairment, functional decline and behavioral disturbance (hallucinations/delusions).   It is most likely that her dementia is due to longstanding Parkinson's disease. I do not suspect Lewy body dementia, given the time course of symptoms (hallucinations started >10 years after parkinsonism, and cognitive deficits started years after first symptoms of parkinsonism). Based on her cognitive profile of relative strengths and weaknesses, and her clinical features, I do  not suspect superimposed Alzheimer's disease.  She does have a longstanding history of depression and anxiety, and currently presents with at least mild depression (likely more significant than she admits).    Recommendations/Plan: Based on the findings of the present evaluation, the following recommendations are offered:  1. Education and support for family is of key importance. The patient's daughter has met with our PD social worker and has been provided with local resources. 2. I agree with Dr. Doristine Devoid recommendations for multidisciplinary approach with PT/OT, ST, regular physical exercise, and social work services as needed.  3. I recommend implementing as much routine and structure as possible in daily life. Continue assistance with all IADLs. She should not be left alone. 4. Hopefully seroquel will improve sleep, which is one of the biggest problems right now. 5. She will continue on Exelon  patch for PDD. 6. At the follow-up appointment on 11/22/2017, the patient's daughter reported the patient has been in a persistent delusional state since Sunday. They have been unable to get Seroquel approved by insurance so she has not started that yet (but she is still taking risperidone). There are no signs of UTI or other infection that could be causing delirium. Apparently she believes her family is trying to kill her and poison her (the patient does not admit this to me). She has been disoriented to her surroundings off and on, thinking she is in a hospital. She says she sees a black man coming into her room and has been scared to go into her room off and on. She has been resistant to taking her medications off and on. She tried to leave the house early this morning and her family had to physically carry her inside. She has made comments about wanting to die and apparently has threatened to take all her medications to end her life. During our visit today, the patient appears confused but is oriented to person, place and year. She is not oriented to month, day or date. She can repeat 4 digits forward and 2 digits backward. Her speech is coherent. She denies currently feeling unsafe. She also consistently denies suicidal ideation or intention at the present time. I advised that if behavior is unable to be managed at home, or if there is any threat to her safety at any time (eg is refusing to stay in the house, is refusing to take medication, is at risk of self harm or harm to others), her daughter can call EMS or take her to the emergency room. While I do think the patient is experiencing neuropsychiatric symptoms of PDD, there does appear to be some possible manipulation involved on the part of the patient. The patient knows what symptoms to deny to healthcare providers in order to avoid involuntary admission, per her daughter.  Feedback to Patient: [name] and her daughter [name] returned for a  feedback appointment on 11/22/2017 to review the results of her neuropsychological evaluation with this provider. 40 minutes face-to-face time was spent reviewing her test results, my impressions and my recommendations as detailed above.    Total time spent on this patient's case: [id number] unit for interview with psychologist; [phone] units of testing by psychometrician under psychologist's supervision; [phone] units for medical record review, scoring of neuropsychological tests, interpretation of test results, preparation of this report, and review of results to the patient by psychologist.      Thank you for your referral of [name]. Please feel free to contact me if you  have any questions or concerns regarding this report.

## 2017-11-22 ENCOUNTER — Telehealth: Payer: Self-pay | Admitting: *Deleted

## 2017-11-22 ENCOUNTER — Telehealth: Payer: Self-pay | Admitting: Neurology

## 2017-11-22 ENCOUNTER — Ambulatory Visit: Payer: Medicare Other

## 2017-11-22 ENCOUNTER — Ambulatory Visit (INDEPENDENT_AMBULATORY_CARE_PROVIDER_SITE_OTHER): Payer: Medicare Other | Admitting: Psychology

## 2017-11-22 ENCOUNTER — Encounter: Payer: Self-pay | Admitting: Neurology

## 2017-11-22 ENCOUNTER — Encounter: Payer: Self-pay | Admitting: Psychology

## 2017-11-22 DIAGNOSIS — G20 Parkinson's disease: Secondary | ICD-10-CM | POA: Diagnosis not present

## 2017-11-22 DIAGNOSIS — F0281 Dementia in other diseases classified elsewhere with behavioral disturbance: Secondary | ICD-10-CM | POA: Diagnosis not present

## 2017-11-22 DIAGNOSIS — N390 Urinary tract infection, site not specified: Secondary | ICD-10-CM | POA: Diagnosis not present

## 2017-11-22 NOTE — Telephone Encounter (Signed)
-----   Message from [place], DO sent at 11/22/2017  4:03 PM EST -----   ----- Message ----- From: Kandis Nab, PsyD Sent: 11/22/2017   3:56 PM To: Eustace Quail Tat, DO

## 2017-11-22 NOTE — Progress Notes (Signed)
I discussed the above with Dr. Si Raider.  I believe Dr. Si Raider told daughter that needed to take patient to ER given delusional behavior.  Needs psychiatric care for this asap.  Jade, please make sure that we relay this if not already done so.

## 2017-11-22 NOTE — Telephone Encounter (Signed)
No phone number left? Appeal letter faxed in this morning.

## 2017-11-22 NOTE — Telephone Encounter (Signed)
Tried to call patient's daughter with no answer.

## 2017-11-22 NOTE — Telephone Encounter (Signed)
Fax received to make Dr. [name] aware that pt is not taking nuplazid. (pt is seeing [facility] at this time).[phone], [phone].

## 2017-11-22 NOTE — Telephone Encounter (Signed)
[name] with medicare left a voicemail message regarding a denial for medication and she left a fax# [phone]

## 2017-11-22 NOTE — Patient Instructions (Signed)
It is most likely that her dementia is due to longstanding Parkinson's disease. I do not suspect Lewy body dementia, given the time course of symptoms (hallucinations started >10 years after parkinsonism, and cognitive deficits started years after first symptoms of parkinsonism). Based on her cognitive profile of relative strengths and weaknesses, and her clinical features, I do not suspect superimposed Alzheimer's disease.  I agree with Dr. Doristine Devoid recommendations for multidisciplinary approach with PT/OT, ST, regular physical exercise, and social work services as needed.   Implement as much routine and structure as possible in daily life. Continue assistance with all complex ADLs (appointments, finances, meals, transportation, medications).  Hopefully seroquel will improve sleep and hallucinations.  She will continue on Exelon patch for PD dementia.

## 2017-11-22 NOTE — Telephone Encounter (Signed)
[employer] called to say the medication of Quetiapine has been approved

## 2017-11-23 NOTE — Progress Notes (Signed)
I met with patient and daughter briefly before their appointment with Dr. Si Raider. The caregiver stated that they are still pending the new medication  and there were some concerns of her moms delusions  and an episode of her mom walking out of the home. We talked a little about self care for the caregiver as they did not want any resources until after the patient starts her new medication.   The daughter did ask about essential oils and how they may be calming for her mother. No medical advice of recommendation on this was provided, but I offered to do some research to see if there is any information as some LTC facilities use this.

## 2017-11-26 DIAGNOSIS — K219 Gastro-esophageal reflux disease without esophagitis: Secondary | ICD-10-CM | POA: Diagnosis present

## 2017-11-26 DIAGNOSIS — E785 Hyperlipidemia, unspecified: Secondary | ICD-10-CM | POA: Diagnosis not present

## 2017-11-26 DIAGNOSIS — N183 Chronic kidney disease, stage 3 (moderate): Secondary | ICD-10-CM | POA: Diagnosis present

## 2017-11-26 DIAGNOSIS — Z23 Encounter for immunization: Secondary | ICD-10-CM | POA: Diagnosis not present

## 2017-11-26 DIAGNOSIS — I1310 Hypertensive heart and chronic kidney disease without heart failure, with stage 1 through stage 4 chronic kidney disease, or unspecified chronic kidney disease: Secondary | ICD-10-CM | POA: Diagnosis present

## 2017-11-26 DIAGNOSIS — I2510 Atherosclerotic heart disease of native coronary artery without angina pectoris: Secondary | ICD-10-CM | POA: Diagnosis present

## 2017-11-26 DIAGNOSIS — F419 Anxiety disorder, unspecified: Secondary | ICD-10-CM | POA: Diagnosis not present

## 2017-11-26 DIAGNOSIS — Z7984 Long term (current) use of oral hypoglycemic drugs: Secondary | ICD-10-CM | POA: Diagnosis not present

## 2017-11-26 DIAGNOSIS — Z886 Allergy status to analgesic agent status: Secondary | ICD-10-CM | POA: Diagnosis not present

## 2017-11-26 DIAGNOSIS — R8271 Bacteriuria: Secondary | ICD-10-CM | POA: Diagnosis present

## 2017-11-26 DIAGNOSIS — I129 Hypertensive chronic kidney disease with stage 1 through stage 4 chronic kidney disease, or unspecified chronic kidney disease: Secondary | ICD-10-CM | POA: Diagnosis not present

## 2017-11-26 DIAGNOSIS — R1310 Dysphagia, unspecified: Secondary | ICD-10-CM | POA: Diagnosis present

## 2017-11-26 DIAGNOSIS — E1122 Type 2 diabetes mellitus with diabetic chronic kidney disease: Secondary | ICD-10-CM | POA: Diagnosis present

## 2017-11-26 DIAGNOSIS — F0391 Unspecified dementia with behavioral disturbance: Secondary | ICD-10-CM | POA: Diagnosis not present

## 2017-11-26 DIAGNOSIS — Z79899 Other long term (current) drug therapy: Secondary | ICD-10-CM | POA: Diagnosis not present

## 2017-11-26 DIAGNOSIS — F0281 Dementia in other diseases classified elsewhere with behavioral disturbance: Secondary | ICD-10-CM | POA: Diagnosis present

## 2017-11-26 DIAGNOSIS — G3183 Dementia with Lewy bodies: Secondary | ICD-10-CM | POA: Diagnosis not present

## 2017-11-26 DIAGNOSIS — G20 Parkinson's disease: Secondary | ICD-10-CM | POA: Diagnosis not present

## 2017-11-26 DIAGNOSIS — E1169 Type 2 diabetes mellitus with other specified complication: Secondary | ICD-10-CM | POA: Diagnosis not present

## 2017-11-26 DIAGNOSIS — D649 Anemia, unspecified: Secondary | ICD-10-CM | POA: Diagnosis present

## 2017-11-26 DIAGNOSIS — Z881 Allergy status to other antibiotic agents status: Secondary | ICD-10-CM | POA: Diagnosis not present

## 2017-11-26 DIAGNOSIS — Z951 Presence of aortocoronary bypass graft: Secondary | ICD-10-CM | POA: Diagnosis not present

## 2017-11-26 DIAGNOSIS — G4733 Obstructive sleep apnea (adult) (pediatric): Secondary | ICD-10-CM | POA: Diagnosis present

## 2017-12-03 IMAGING — CT CT HEAD W/O CM
3 of 4 series · 17 of 47 positions shown, 20 images · non-contrast
Comparison: 11/01/2011 MR and CT

CLINICAL DATA: 75-year-old female with altered mental status and
hallucinations.

EXAM:
CT HEAD WITHOUT CONTRAST
TECHNIQUE: Contiguous axial images were obtained from the base of the skull
through the vertex without intravenous contrast.

[Series 201: head w/o, idose (1) · axial · non-contrast · 0.49mm/px · z∈[+79,+204]mm · 11 of 31 slices shown, 14 images]
[im 3/31  brain]
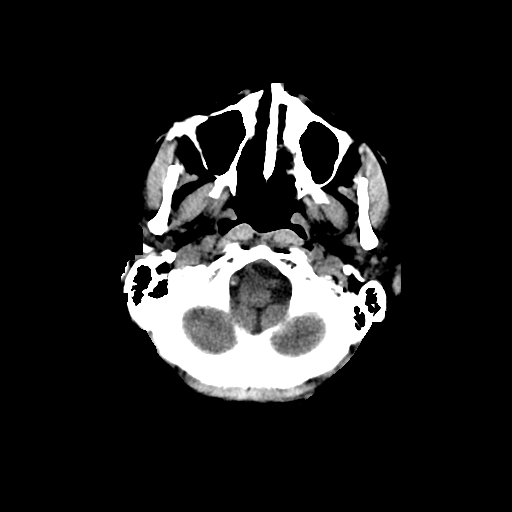
[im 3/31  bone]
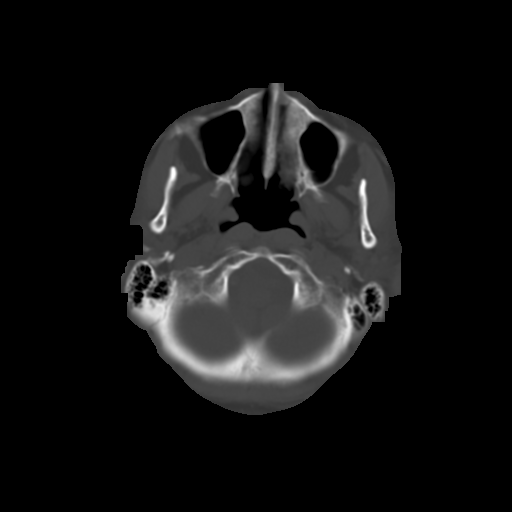
[im 5/31  brain]
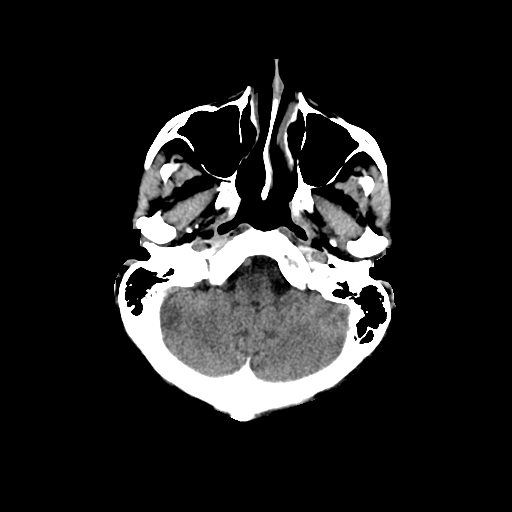
[im 7/31  brain]
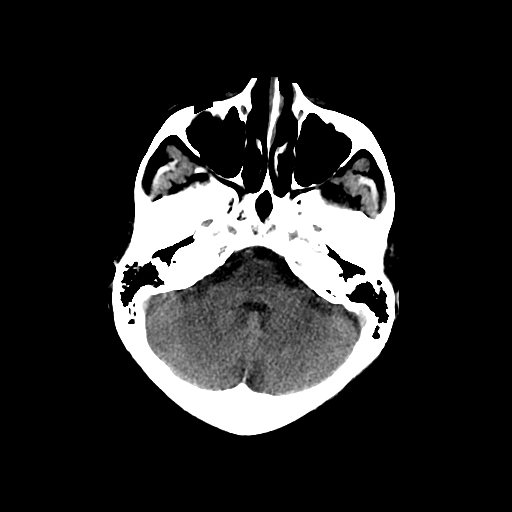
[im 11/31  brain]
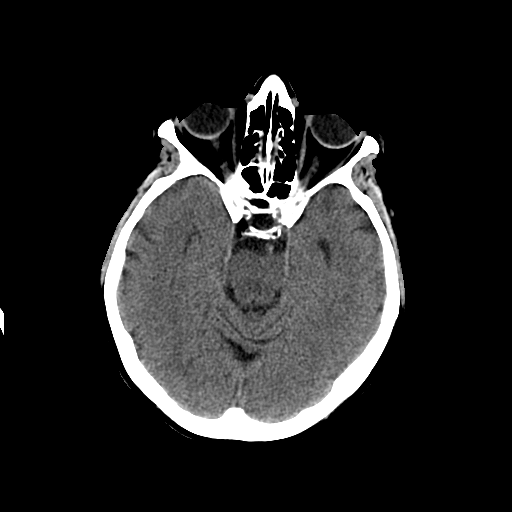
[im 13/31  brain]
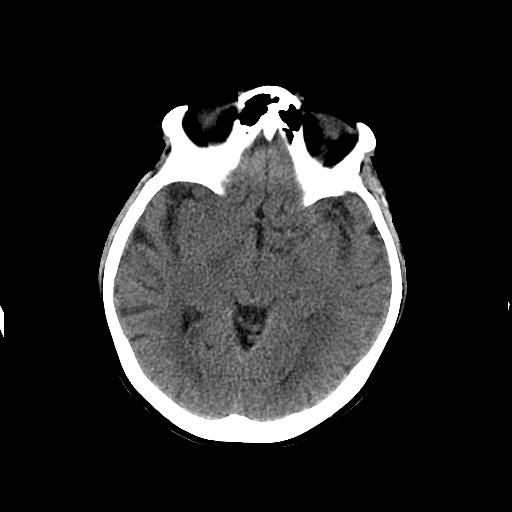
[im 13/31  bone]
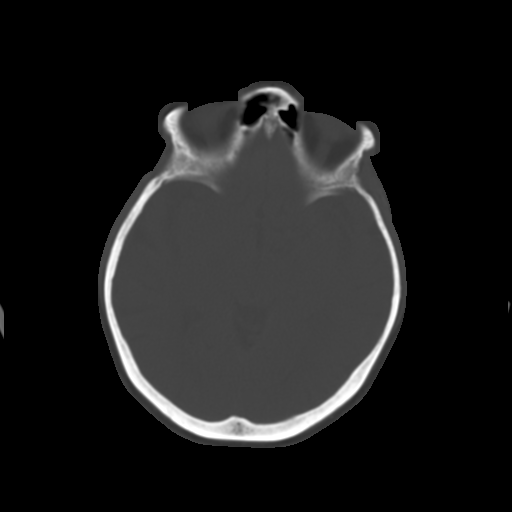
[im 16/31  brain]
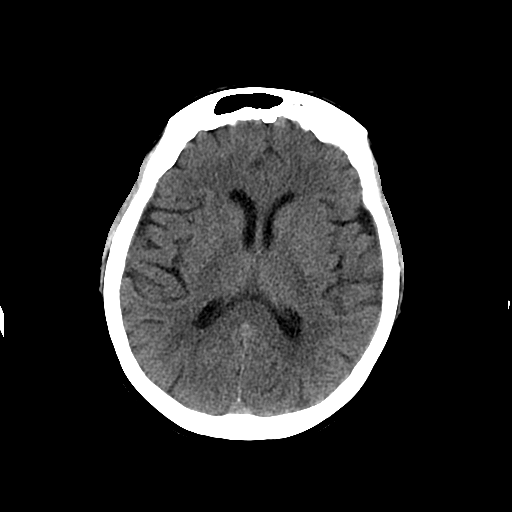
[im 18/31  brain]
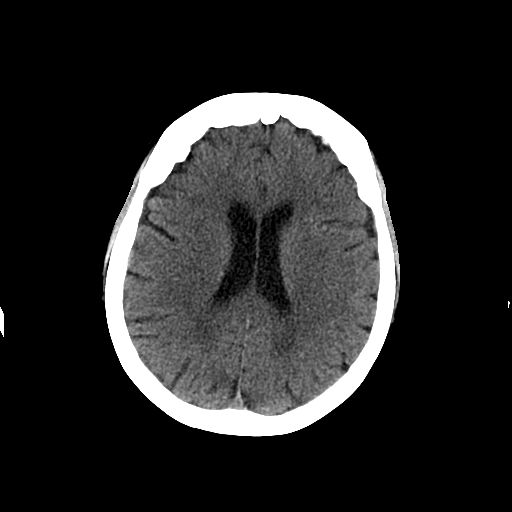
[im 20/31  brain]
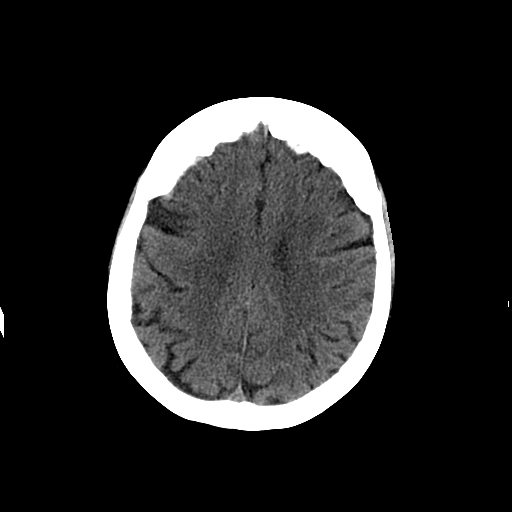
[im 24/31  brain]
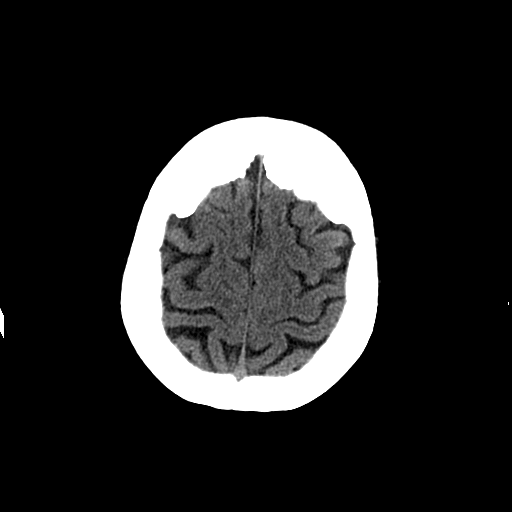
[im 24/31  bone]
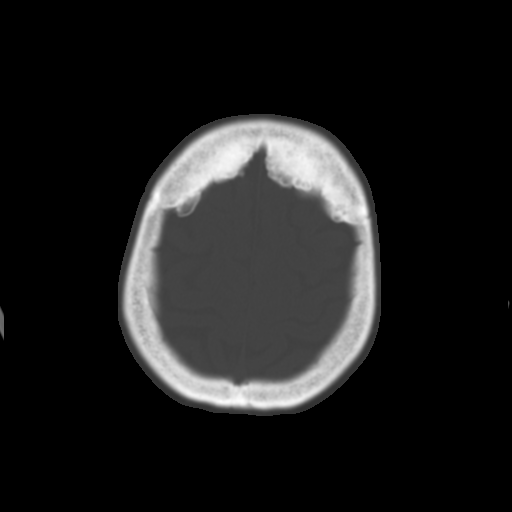
[im 26/31  brain]
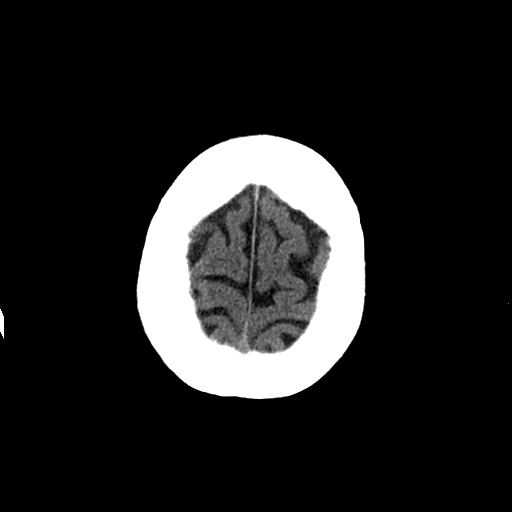
[im 28/31  brain]
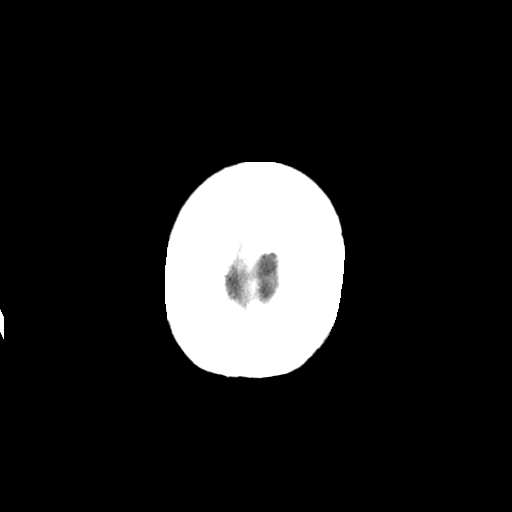

[Series 203: coronal st, idose (1) · coronal · 0.40mm/px · 3 of 79 slices shown]
[im 27/79  brain]
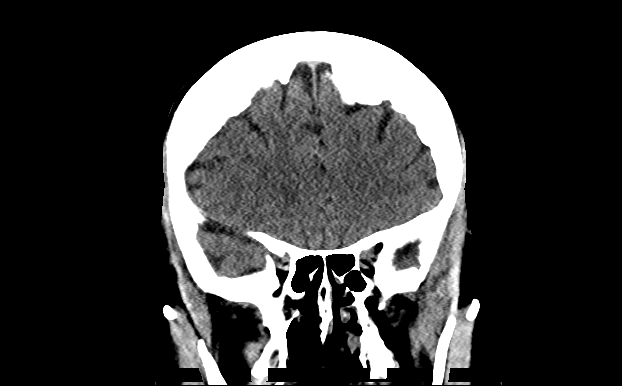
[im 35/79  brain]
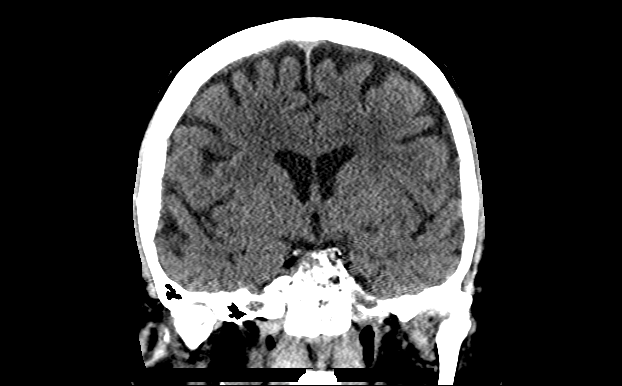
[im 44/79  brain]
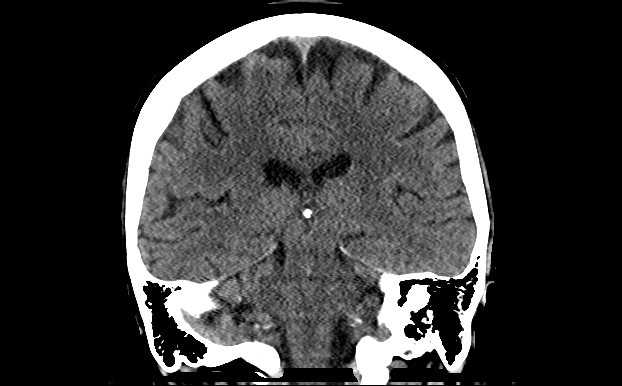

[Series 204: sagittal st, idose (1) · sagittal · 0.40mm/px · 3 of 83 slices shown]
[im 28/83  brain]
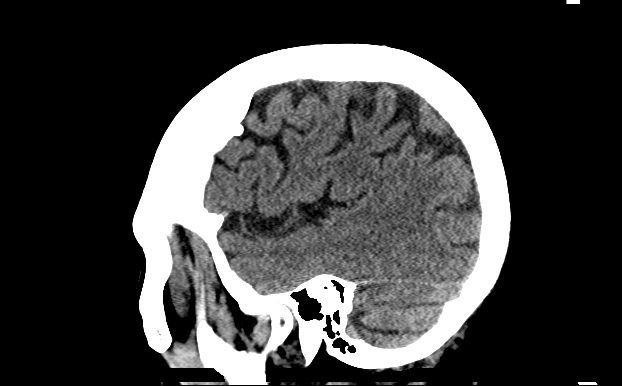
[im 42/83  brain]
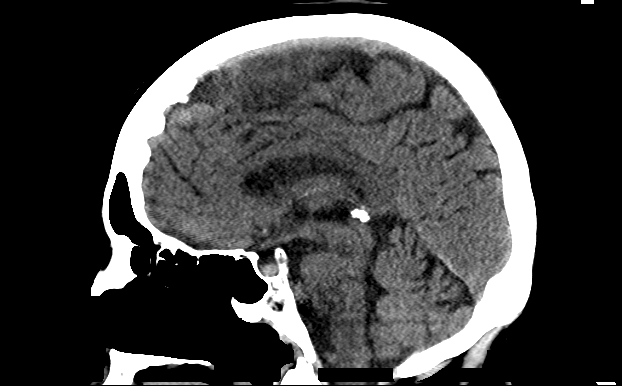
[im 55/83  brain]
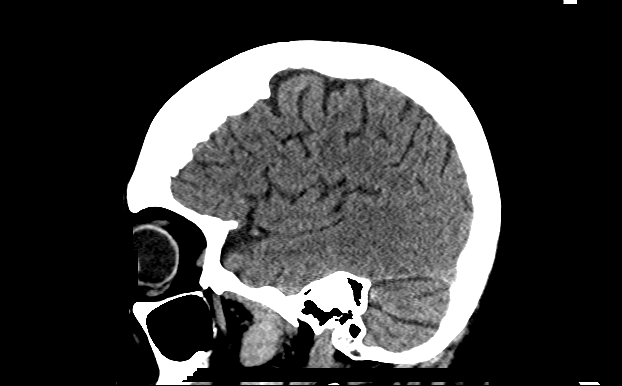

[17 of 47 positions shown; findings below may reference images not displayed]

FINDINGS: Mild chronic small-vessel white matter ischemic changes are again
noted.

No acute intracranial abnormalities are identified, including mass
lesion or mass effect, hydrocephalus, extra-axial fluid collection,
midline shift, hemorrhage, or acute infarction.

The visualized bony calvarium is unremarkable.
IMPRESSION: No evidence of acute intracranial abnormality.

Mild chronic small-vessel white matter ischemic changes.

## 2017-12-12 ENCOUNTER — Telehealth: Payer: Self-pay | Admitting: Psychology

## 2017-12-12 ENCOUNTER — Telehealth: Payer: Self-pay | Admitting: Neurology

## 2017-12-12 DIAGNOSIS — G20 Parkinson's disease: Secondary | ICD-10-CM | POA: Diagnosis not present

## 2017-12-12 DIAGNOSIS — F0281 Dementia in other diseases classified elsewhere with behavioral disturbance: Secondary | ICD-10-CM
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
## 2017-12-12 NOTE — Telephone Encounter (Signed)
Placed a telephone call to the patient's daughter ,[name],  to inquire the name of the psychiatric provider that the patient is currently established with so we would have for coordination of care.  A message was left and I included my contact information for her to return the phone call.

## 2017-12-12 NOTE — Telephone Encounter (Signed)
-----   Message from Natividad Brood, [place] sent at 12/12/2017  1:07 PM EST ----- Patient's daughter contacted me back and left a message indicating that they were under the impression that all psychiatric medications would be managed by Dr. Carles Collet.  She said " we have an appt next Friday and can discuss with her then". I will contact her back and provide her with a list of psychiatrists.  In addition, I contacted [facility] behavioral health unit social work team and asked them if they could email me a list of the psychiatrist that they refer patients to upon discharge from the hospital.I will integrate this list as a resource for the future.

## 2017-12-13 ENCOUNTER — Encounter: Payer: Self-pay | Admitting: Psychology

## 2017-12-13 NOTE — Progress Notes (Signed)
I provided the following resources for psychiatrist for the patient as recommended from [place] worker at [facility] geriatric behavioral health department.  [email or web address] [email or web address] [email or web address].es  In addition, I provided resources on assisted living with memory care that are local to [place] at [facility] [place]

## 2017-12-14 DIAGNOSIS — G20 Parkinson's disease: Secondary | ICD-10-CM | POA: Diagnosis not present

## 2017-12-14 DIAGNOSIS — Z6829 Body mass index (BMI) 29.0-29.9, adult: Secondary | ICD-10-CM | POA: Diagnosis not present

## 2017-12-14 DIAGNOSIS — Z23 Encounter for immunization: Secondary | ICD-10-CM | POA: Diagnosis not present

## 2017-12-14 DIAGNOSIS — F0391 Unspecified dementia with behavioral disturbance: Secondary | ICD-10-CM | POA: Diagnosis not present

## 2017-12-19 NOTE — Progress Notes (Signed)
[name] was seen today in the movement disorders clinic for neurologic consultation at the request of [name], MD and Dr. [name].  The records that were made available to me were reviewed.  The consultation is for the evaluation of PD.  This patient is accompanied in the office by her daughter who supplements the history.    Patient notes from 2012 indicate that she had a 10-year history at that time of tremor and had been treated with primidone.  She also was reporting confusion and progressive memory loss in 2012.  She was being treated for anxiety and depression as well.  In 2013, she was placed on carbidopa levodopa, and records indicate that the initiation of this was beneficial.  By 2014, the patient was describing motor fluctuations.  At the end of 2014, she was placed on amantadine.  She felt she had side effects of confusion with this medication.  She did not take it for long.  When she came back and followed up in early 2015, she reported that she had had some hallucinations.  It is unclear if these occurred when she was on amantadine.  In February, 2016, she was transitioned to Rytary 95 mg, 3 tablets 3 times per day.  The dosage was increased to 4 tablets 3 times per day in October, 2016 until she complained about more visual hallucinations and this was backed down again to 3 tablets 3 times a day in January, 2017.  Nuplazid was started in May, 2017 for the same.  She never took that.  She ultimately was hospitalized and was placed on Risperdal in Aug, 2017.  Her rytary was increased this year to 145 mg, 3 po tid (6am/2pm/10pm). she saw Dr. Si Raider and had neurocognitive testing on October 25, 2017.  She has not returned yet for results, but I did talk to Dr. Si Raider about those.  She has  mild to moderate dementia.  She also has anxiety and depression.  Specific Symptoms:  Tremor: Yes.   (L arm mostly, occasionally on the R arm.  Also inner tremor) Family hx of similar:  Yes.    - father, sister with PD Voice: softer Sleep: sleeping well  Vivid Dreams:  No.  Acting out dreams:  No. Wet Pillows: No. Postural symptoms:  Yes.    Falls?  No. Bradykinesia symptoms: slow movements and difficulty getting out of a chair Loss of smell:  Yes.   Loss of taste:  Yes.   Urinary Incontinence:  Yes.  , wears depends Difficulty Swallowing:  rare Handwriting, micrographia: Yes.   Trouble with ADL's:  Yes.    Trouble buttoning clothing: Yes.   Depression:  No. per patient; yes per daughter Memory changes:  Yes.   (lives with daughter since 07/2016) Hallucinations:  Yes.   (still having these - all sorts of children, bugs - she occasionally thinks that they are real.  Happen day and night.  Has called 911 because of these.  Daughter would like her off of Risperdal but would like to transition her to something else with)  visual distortions: Yes.   N/V:  No. Lightheaded:  Yes.    Syncope: No. Diplopia:  No. Dyskinesia:  No.  Neuroimaging of the brain has previously been performed.  CT brain done in 2017 showed mild small vessel disease.  12/21/16 update: the patient is seen today in follow-up, accompanied by her daughters (two of them) who supplements the history.  I have reviewed numerous records  made available to me.  Patient was hospitalized since our last visit at [facility] behavioral health because of delusions and hallucinations.  Patient was admitted from November 26, 2017 to December 06, 2017.  She was discharged on quetiapine, 25 mg at 8 AM and 5 PM and quetiapine 100 mg at bedtime.  The day of discharge, she became angry at a staff member and scratched them and therefore, Depakote, 125 mg 3 times per day was added with meals the day of discharge.  Daughters state that delusions and hallucinations are still present and agitation is there if she is fearful.  Her daughter had to give her a xanax the other day.  They are looking at alterative living situations as her family is  tired.  Still on rytary, 145 mg, 3 po tid.  Needs assistance dressing.  Could not do out patient therapy now.  Needs home therapy.    PREVIOUS MEDICATIONS:  amantadine, primidone, carbidopa/levodopa, rytary  ALLERGIES:   Allergies  Allergen Reactions  . Aspirin Anaphylaxis    swelling  . Maxitrol [Neomycin-Polymyxin-Dexameth] Itching and Other (See Comments)    Redness to the eye    CURRENT MEDICATIONS:  Outpatient Encounter Medications as of 12/21/2017  Medication Sig  . ALPRAZolam (XANAX) 0.25 MG tablet Take 0.25 mg by mouth 2 (two) times daily as needed for anxiety. Take 1/2- 1 tab twice daily as needed  . Carbidopa-Levodopa ER (RYTARY) 36.25-145 MG CPCR Take 3 capsules by mouth 3 (three) times daily.  . QUEtiapine (SEROQUEL) 100 MG tablet quetiapine 100 mg tablet  TK 1 T PO HS  . QUEtiapine (SEROQUEL) 25 MG tablet Take 25 mg by mouth 2 (two) times daily.  . rivastigmine (EXELON) 9.5 mg/24hr Place onto the skin.  Marland Kitchen sertraline (ZOLOFT) 100 MG tablet Take 100 mg by mouth daily.  Marland Kitchen tiZANidine (ZANAFLEX) 4 MG tablet TAKE 1 TABLET(4 MG) BY MOUTH EVERY 8 HOURS AS NEEDED FOR MUSCLE SPASMS  . [DISCONTINUED] divalproex (DEPAKOTE SPRINKLE) 125 MG capsule Take 125 mg by mouth 3 (three) times daily.   . divalproex (DEPAKOTE) 250 MG DR tablet Take 1 tablet (250 mg total) by mouth 2 (two) times daily.  . [DISCONTINUED] QUEtiapine (SEROQUEL) 50 MG tablet Take 0.5 tablets (25 mg total) by mouth at bedtime. May titrate higher in two weeks  . [DISCONTINUED] rivastigmine (EXELON) 4.6 mg/24hr Place 1 patch (4.6 mg total) onto the skin every evening.   No facility-administered encounter medications on file as of 12/21/2017.     PAST MEDICAL HISTORY:   Past Medical History:  Diagnosis Date  . Anxiety   . Coronary artery disease    Mid LAD Stent 2001 Patent By cath on 08/10/2010  . 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 Diabetes mellitus    Type II  . Dyslipidemia   . GERD (gastroesophageal  reflux disease)   . HTN (hypertension)   . Parkinson disease ([place])     PAST SURGICAL HISTORY:   Past Surgical History:  Procedure Laterality Date  . APPENDECTOMY    . CARDIAC CATHETERIZATION  07/02/2000   normal LV function, 40-50% prox smooth LAD stenosis between 1st and 2nd diagonal, normal Cfx & RCA (Dr. Corky Downs)  . CARDIAC CATHETERIZATION  02/20/2001   normal LV function, 50% stenosis prox to prox 3.5x80mm Nir Elite stent, 80% stenosis in a 1st septal perforating artery (Dr. Corky Downs)  . CARDIAC CATHETERIZATION  06/18/2006   patent LAD stent, dominant Cfx, low-normal EF (Dr. Domenic Moras)  .  CARDIAC CATHETERIZATION  08/10/2010   patent mid LAD stent, nonobstructive CAD (Dr. Alma Friendly)  . CHOLECYSTECTOMY  1963  . CORONARY ANGIOPLASTY WITH STENT PLACEMENT  10/31/2000   single vessel CAD (mild degree in mid-LAD 60% stenosis) - predilatation of plaque in mid LAD with cutting balloon angioplasty; 3.0x28mm Nir Elite stent to mid-LAD (Dr. Domenic Moras)  . HYSTERECTOMY  1989  . NM MYOCAR PERF WALL MOTION  2012   lexiscan - normal perfusion, EF 71%, low risk  . TRANSTHORACIC ECHOCARDIOGRAM  2012   EF 60-65%, mild AV regurg, LA mildly dilated, PA peak pressure 19mmHg    SOCIAL HISTORY:   Social History   Socioeconomic History  . Marital status: Widowed    Spouse name: Not on file  . Number of children: 3  . Years of education: 53  . Highest education level: Not on file  Social Needs  . Financial resource strain: Not on file  . Food insecurity - worry: Not on file  . Food insecurity - inability: Not on file  . Transportation needs - medical: Not on file  . Transportation needs - non-medical: Not on file  Occupational History  . Occupation: Retired    Comment: Metallurgist  Tobacco Use  . Smoking status: Never Smoker  . Smokeless tobacco: Never Used  Substance and Sexual Activity  . Alcohol use: No  . Drug use: No  . Sexual activity: Not on file  Other Topics Concern  . Not on  file  Social History Narrative   Lives with daughter [name] and son in law   Caffeine Use- 3 to 4 cups daily    FAMILY HISTORY:   Family Status  Relation Name Status  . Father  Deceased at age 26  . Mother  31       35yrs old  . Sister  Alive  . Cousin first x 2-sisters Deceased  . Other nephew Alive  . Daughter 2 Alive  . Son  Alive    ROS:  A complete 10 system review of systems was obtained and was unremarkable apart from what is mentioned above.  PHYSICAL EXAMINATION:    VITALS:   Vitals:   12/21/17 1310  BP: 122/78  Pulse: 82  SpO2: 96%  Weight: 185 lb (83.9 kg)  Height: 5\' 6"  (1.676 m)    GEN:  The patient appears stated age and is in NAD. HEENT:  Normocephalic, atraumatic.  The mucous membranes are moist. The superficial temporal arteries are without ropiness or tenderness. CV:  RRR Lungs:  CTAB Neck/HEME:  There are no carotid bruits bilaterally.  Neurological examination:  Orientation: The patient is alert and oriented to person and place.  She is confused and occasionally interjects things that don't make sense into the conversation.  MoCA deferred given just had neurocognitive testing performed. Cranial nerves: There is good facial symmetry. The speech is fluent and clear. Soft palate rises symmetrically and there is no tongue deviation. Hearing is intact to conversational tone. Sensation: Sensation is intact to light touch x 4 Motor: Strength is 5/5 in the bilateral upper and lower extremities.   Shoulder shrug is equal and symmetric.  There is no pronator drift.  Movement examination: Tone: There is normal tone today Abnormal movements: There is LUE resting tremor that is overall mild Coordination:  There is decremation with RAM's, with any form of RAMS, including alternating supination and pronation of the forearm, hand opening and closing, finger taps, heel taps and toe taps. Gait  and Station: The patient pushes off the chair with her hands.  She  is mildly unstable with her cane.  ASSESSMENT/PLAN:  1.  Idiopathic Parkinson's disease.  The patient was diagnosed in approximately 2013  -We discussed the diagnosis as well as pathophysiology of the disease.  We discussed treatment options as well as prognostic indicators.  Patient education was provided.  -We discussed that it used to be thought that levodopa would increase risk of melanoma but now it is believed that Parkinsons itself likely increases risk of melanoma. she is to get regular skin checks.  -She looks fairly optimally treated with rytary 145 mg, 3 tablets 3 times per day but she is having so many hallucinations that I will drop this a little to rytary, 145mg , 2 tablets 3 times per day.  She takes the last one at bedtime.  When she tried to move that closer up in the day, she had tremor at night that was awakening her.  She will continue with this medication.  Risks, benefits, side effects and alternative therapies were discussed.  The opportunity to ask questions was given and they were answered to the best of my ability.  The patient expressed understanding and willingness to follow the outlined treatment protocols.  -I will send home therapy, PT/OT/social work to the home  -they are working with attorney to get medicaid to allow for SNF   2.  Parkinson's disease dementia with hallucinations  -Patient had neurocognitive testing in November, 2018.  This was consistent with mild to moderate dementia.  -I had a long discussion with patient and her daughter.  She is currently on quetiapine, 25 mg at 8 AM and 5 PM and 100 mg at night.  Daughters are very frustrated and patient not sleeping well.  They ask me multiple times to give her meds to help sleep better.  Increase seroquel to 25 mg at 8am/5pm and 150 mg at bedtime.  We did talk about the fact that the atypical antipsychotic medications are not indicated for dementia related psychosis and increased risk of mortality in the elderly,  usually because of infectious or  cardiac related. Understanding is expressed and they were agreeable that the benefits outweigh the risks in this case.  -The patient is also on Depakote, 125 mg 3 times per day.  The patient's quetiapine and Depakote were either started or altered by the psychiatrist that her most recent hospitalization.  Will increase VPA to 250 mg bid.   I expressed to the patient and her daughter that she needed psychiatric care and needed to be under the care of a psychiatrist, along with me.  Expressed the concept of team care with them.  -Patient is on the Exelon patch. She will continue that  3.  Anxiety and depression  -I think that counseling would be of value.  This has been long-standing, according to records.    4.  Follow up is anticipated in the next few months, sooner should new neurologic issues arise.  Much greater than 50% of this visit was spent in counseling and coordinating care.  Total face to face time:  35 min This did not include the 40 min of record review which was detailed above, which was non face to face time and completed 12/12/17.   Cc:  [name], MD

## 2017-12-21 ENCOUNTER — Encounter: Payer: Self-pay | Admitting: Neurology

## 2017-12-21 ENCOUNTER — Ambulatory Visit (INDEPENDENT_AMBULATORY_CARE_PROVIDER_SITE_OTHER): Payer: Medicare Other | Admitting: Neurology

## 2017-12-21 VITALS — BP 122/78 | HR 82 | Ht 66.0 in | Wt 185.0 lb

## 2017-12-21 DIAGNOSIS — F0281 Dementia in other diseases classified elsewhere with behavioral disturbance: Secondary | ICD-10-CM

## 2017-12-21 DIAGNOSIS — G20 Parkinson's disease: Secondary | ICD-10-CM | POA: Diagnosis not present

## 2017-12-21 DIAGNOSIS — F02818 Dementia in other diseases classified elsewhere, unspecified severity, with other behavioral disturbance: Secondary | ICD-10-CM

## 2017-12-21 MED ORDER — DIVALPROEX SODIUM 250 MG PO DR TAB: 250.0000 mg | DELAYED_RELEASE_TABLET | Freq: Two times a day (BID) | ORAL | 0 refills | Status: DC

## 2017-12-21 NOTE — Patient Instructions (Signed)
1. Decrease Rytary 145 mg - 2 tablets three times daily.   2. Increase Seroquel to 25 mg in the morning, 25 mg in the afternoon, 150 mg at bedtime.   3. Change Depakote to 250 mg twice daily. Prescription sent to [facility] mail order).   4. We will send home health for physical and occupational therapy. They will contact you to set this up.   5. Set up appointment with psychiatry.   6. Follow up 3-4 months.

## 2017-12-25 DIAGNOSIS — G20 Parkinson's disease: Secondary | ICD-10-CM | POA: Diagnosis not present

## 2017-12-25 DIAGNOSIS — Z7984 Long term (current) use of oral hypoglycemic drugs: Secondary | ICD-10-CM | POA: Diagnosis not present

## 2017-12-25 DIAGNOSIS — Z9181 History of falling: Secondary | ICD-10-CM | POA: Diagnosis not present

## 2017-12-25 DIAGNOSIS — F0391 Unspecified dementia with behavioral disturbance: Secondary | ICD-10-CM | POA: Diagnosis not present

## 2017-12-25 DIAGNOSIS — E119 Type 2 diabetes mellitus without complications: Secondary | ICD-10-CM | POA: Diagnosis not present

## 2017-12-26 DIAGNOSIS — Z7984 Long term (current) use of oral hypoglycemic drugs: Secondary | ICD-10-CM | POA: Diagnosis not present

## 2017-12-26 DIAGNOSIS — F0391 Unspecified dementia with behavioral disturbance: Secondary | ICD-10-CM | POA: Diagnosis not present

## 2017-12-26 DIAGNOSIS — G20 Parkinson's disease: Secondary | ICD-10-CM | POA: Diagnosis not present

## 2017-12-26 DIAGNOSIS — Z9181 History of falling: Secondary | ICD-10-CM | POA: Diagnosis not present

## 2017-12-26 DIAGNOSIS — E119 Type 2 diabetes mellitus without complications: Secondary | ICD-10-CM | POA: Diagnosis not present

## 2017-12-27 ENCOUNTER — Telehealth: Payer: Self-pay | Admitting: Neurology

## 2017-12-27 NOTE — Telephone Encounter (Signed)
[name] and that pt's daughter wants them to wait until next week to come out  CB# [phone]

## 2017-12-27 NOTE — Telephone Encounter (Signed)
Spoke with [facility]. Physical therapist, and given orders to see patient 2xweek for 6 weeks.

## 2017-12-27 NOTE — Telephone Encounter (Signed)
Verbal order given to [facility], occupational therapist, to see patient 1x week for 1 week, then 2x week for 4 weeks.

## 2017-12-27 NOTE — Telephone Encounter (Signed)
Noted  

## 2018-01-01 ENCOUNTER — Ambulatory Visit: Payer: Medicare Other | Admitting: Diagnostic Neuroimaging

## 2018-01-01 DIAGNOSIS — G20 Parkinson's disease: Secondary | ICD-10-CM | POA: Diagnosis not present

## 2018-01-01 DIAGNOSIS — Z7984 Long term (current) use of oral hypoglycemic drugs: Secondary | ICD-10-CM | POA: Diagnosis not present

## 2018-01-01 DIAGNOSIS — F0391 Unspecified dementia with behavioral disturbance: Secondary | ICD-10-CM | POA: Diagnosis not present

## 2018-01-01 DIAGNOSIS — E119 Type 2 diabetes mellitus without complications: Secondary | ICD-10-CM | POA: Diagnosis not present

## 2018-01-01 DIAGNOSIS — Z9181 History of falling: Secondary | ICD-10-CM | POA: Diagnosis not present

## 2018-01-03 DIAGNOSIS — Z7984 Long term (current) use of oral hypoglycemic drugs: Secondary | ICD-10-CM | POA: Diagnosis not present

## 2018-01-03 DIAGNOSIS — Z9181 History of falling: Secondary | ICD-10-CM | POA: Diagnosis not present

## 2018-01-03 DIAGNOSIS — G20 Parkinson's disease: Secondary | ICD-10-CM | POA: Diagnosis not present

## 2018-01-03 DIAGNOSIS — E119 Type 2 diabetes mellitus without complications: Secondary | ICD-10-CM | POA: Diagnosis not present

## 2018-01-03 DIAGNOSIS — F0391 Unspecified dementia with behavioral disturbance: Secondary | ICD-10-CM | POA: Diagnosis not present

## 2018-01-04 ENCOUNTER — Ambulatory Visit: Payer: Self-pay | Admitting: Neurology

## 2018-01-08 DIAGNOSIS — Z9181 History of falling: Secondary | ICD-10-CM | POA: Diagnosis not present

## 2018-01-08 DIAGNOSIS — Z7984 Long term (current) use of oral hypoglycemic drugs: Secondary | ICD-10-CM | POA: Diagnosis not present

## 2018-01-08 DIAGNOSIS — F0391 Unspecified dementia with behavioral disturbance: Secondary | ICD-10-CM | POA: Diagnosis not present

## 2018-01-08 DIAGNOSIS — E119 Type 2 diabetes mellitus without complications: Secondary | ICD-10-CM | POA: Diagnosis not present

## 2018-01-08 DIAGNOSIS — G20 Parkinson's disease: Secondary | ICD-10-CM | POA: Diagnosis not present

## 2018-01-10 DIAGNOSIS — Z7984 Long term (current) use of oral hypoglycemic drugs: Secondary | ICD-10-CM | POA: Diagnosis not present

## 2018-01-10 DIAGNOSIS — G20 Parkinson's disease: Secondary | ICD-10-CM | POA: Diagnosis not present

## 2018-01-10 DIAGNOSIS — F0391 Unspecified dementia with behavioral disturbance: Secondary | ICD-10-CM | POA: Diagnosis not present

## 2018-01-10 DIAGNOSIS — E119 Type 2 diabetes mellitus without complications: Secondary | ICD-10-CM | POA: Diagnosis not present

## 2018-01-10 DIAGNOSIS — Z9181 History of falling: Secondary | ICD-10-CM | POA: Diagnosis not present

## 2018-01-11 ENCOUNTER — Telehealth: Payer: Self-pay | Admitting: Neurology

## 2018-01-11 NOTE — Telephone Encounter (Signed)
Patient daughter called about patient medication seroquel. She wants to know if she can increase the dosage at night

## 2018-01-11 NOTE — Telephone Encounter (Signed)
Try adding melatonin if havent.  I don't feel comfortable adding more seroquel.  I told them it would take a long time to get into psychiatry.  I also told them to make that appt asap

## 2018-01-11 NOTE — Telephone Encounter (Signed)
Spoke with [employer].  She states that patient is doing much better since medication adjustment. She is still having hallucinations, but they are no longer scary to her. She is also sleeping much better, but every other night she seems to wake up about 2:30 am and unable to go back to sleep. They want to know about increasing nighttime seroquel. Patient was just increased to 150 mg at bedtime on 12/21/2017. She also takes 25 mg BID.  They have not scheduled an appt with psychiatry yet and were encouraged to do so.  Please advise.

## 2018-01-11 NOTE — Telephone Encounter (Signed)
Left message on machine for [name] to call back.

## 2018-01-11 NOTE — Telephone Encounter (Signed)
LMOM per Jennifer's request making her aware of Dr. Doristine Devoid response.

## 2018-01-14 DIAGNOSIS — G4733 Obstructive sleep apnea (adult) (pediatric): Secondary | ICD-10-CM | POA: Diagnosis present

## 2018-01-14 DIAGNOSIS — N289 Disorder of kidney and ureter, unspecified: Secondary | ICD-10-CM | POA: Diagnosis not present

## 2018-01-14 DIAGNOSIS — K649 Unspecified hemorrhoids: Secondary | ICD-10-CM | POA: Diagnosis not present

## 2018-01-14 DIAGNOSIS — B370 Candidal stomatitis: Secondary | ICD-10-CM | POA: Diagnosis not present

## 2018-01-14 DIAGNOSIS — K625 Hemorrhage of anus and rectum: Secondary | ICD-10-CM | POA: Diagnosis not present

## 2018-01-14 DIAGNOSIS — N183 Chronic kidney disease, stage 3 (moderate): Secondary | ICD-10-CM | POA: Diagnosis present

## 2018-01-14 DIAGNOSIS — R0602 Shortness of breath: Secondary | ICD-10-CM | POA: Diagnosis not present

## 2018-01-14 DIAGNOSIS — I129 Hypertensive chronic kidney disease with stage 1 through stage 4 chronic kidney disease, or unspecified chronic kidney disease: Secondary | ICD-10-CM | POA: Diagnosis present

## 2018-01-14 DIAGNOSIS — F0281 Dementia in other diseases classified elsewhere with behavioral disturbance: Secondary | ICD-10-CM | POA: Diagnosis not present

## 2018-01-14 DIAGNOSIS — D649 Anemia, unspecified: Secondary | ICD-10-CM | POA: Diagnosis present

## 2018-01-14 DIAGNOSIS — Z79899 Other long term (current) drug therapy: Secondary | ICD-10-CM | POA: Diagnosis not present

## 2018-01-14 DIAGNOSIS — N390 Urinary tract infection, site not specified: Secondary | ICD-10-CM | POA: Diagnosis present

## 2018-01-14 DIAGNOSIS — K219 Gastro-esophageal reflux disease without esophagitis: Secondary | ICD-10-CM | POA: Diagnosis present

## 2018-01-14 DIAGNOSIS — Z886 Allergy status to analgesic agent status: Secondary | ICD-10-CM | POA: Diagnosis not present

## 2018-01-14 DIAGNOSIS — B9729 Other coronavirus as the cause of diseases classified elsewhere: Secondary | ICD-10-CM | POA: Diagnosis not present

## 2018-01-14 DIAGNOSIS — I9589 Other hypotension: Secondary | ICD-10-CM | POA: Diagnosis not present

## 2018-01-14 DIAGNOSIS — Z20828 Contact with and (suspected) exposure to other viral communicable diseases: Secondary | ICD-10-CM | POA: Diagnosis not present

## 2018-01-14 DIAGNOSIS — Z7189 Other specified counseling: Secondary | ICD-10-CM | POA: Diagnosis not present

## 2018-01-14 DIAGNOSIS — E861 Hypovolemia: Secondary | ICD-10-CM | POA: Diagnosis not present

## 2018-01-14 DIAGNOSIS — Z515 Encounter for palliative care: Secondary | ICD-10-CM | POA: Diagnosis not present

## 2018-01-14 DIAGNOSIS — N179 Acute kidney failure, unspecified: Secondary | ICD-10-CM | POA: Diagnosis not present

## 2018-01-14 DIAGNOSIS — F0391 Unspecified dementia with behavioral disturbance: Secondary | ICD-10-CM | POA: Diagnosis present

## 2018-01-14 DIAGNOSIS — Z7984 Long term (current) use of oral hypoglycemic drugs: Secondary | ICD-10-CM | POA: Diagnosis not present

## 2018-01-14 DIAGNOSIS — K5900 Constipation, unspecified: Secondary | ICD-10-CM | POA: Diagnosis present

## 2018-01-14 DIAGNOSIS — Z888 Allergy status to other drugs, medicaments and biological substances status: Secondary | ICD-10-CM | POA: Diagnosis not present

## 2018-01-14 DIAGNOSIS — E559 Vitamin D deficiency, unspecified: Secondary | ICD-10-CM | POA: Diagnosis present

## 2018-01-14 DIAGNOSIS — J029 Acute pharyngitis, unspecified: Secondary | ICD-10-CM | POA: Diagnosis not present

## 2018-01-14 DIAGNOSIS — G20 Parkinson's disease: Secondary | ICD-10-CM | POA: Diagnosis present

## 2018-01-14 DIAGNOSIS — B952 Enterococcus as the cause of diseases classified elsewhere: Secondary | ICD-10-CM | POA: Diagnosis present

## 2018-01-14 DIAGNOSIS — E785 Hyperlipidemia, unspecified: Secondary | ICD-10-CM | POA: Diagnosis present

## 2018-01-14 DIAGNOSIS — I2510 Atherosclerotic heart disease of native coronary artery without angina pectoris: Secondary | ICD-10-CM | POA: Diagnosis present

## 2018-01-14 DIAGNOSIS — D721 Eosinophilia: Secondary | ICD-10-CM | POA: Diagnosis not present

## 2018-01-14 DIAGNOSIS — R443 Hallucinations, unspecified: Secondary | ICD-10-CM | POA: Diagnosis not present

## 2018-01-14 DIAGNOSIS — E1169 Type 2 diabetes mellitus with other specified complication: Secondary | ICD-10-CM | POA: Diagnosis not present

## 2018-01-14 DIAGNOSIS — J069 Acute upper respiratory infection, unspecified: Secondary | ICD-10-CM | POA: Diagnosis not present

## 2018-01-14 DIAGNOSIS — E1122 Type 2 diabetes mellitus with diabetic chronic kidney disease: Secondary | ICD-10-CM | POA: Diagnosis present

## 2018-01-14 DIAGNOSIS — E876 Hypokalemia: Secondary | ICD-10-CM | POA: Diagnosis not present

## 2018-01-14 DIAGNOSIS — F23 Brief psychotic disorder: Secondary | ICD-10-CM | POA: Diagnosis not present

## 2018-01-14 DIAGNOSIS — F419 Anxiety disorder, unspecified: Secondary | ICD-10-CM | POA: Diagnosis not present

## 2018-01-14 DIAGNOSIS — F600 Paranoid personality disorder: Secondary | ICD-10-CM | POA: Diagnosis not present

## 2018-02-04 ENCOUNTER — Encounter: Payer: Self-pay | Admitting: Psychology

## 2018-02-06 NOTE — Telephone Encounter (Signed)
Error

## 2018-02-17 ENCOUNTER — Encounter: Payer: Self-pay | Admitting: Neurology

## 2018-02-19 NOTE — Progress Notes (Deleted)
[name] was seen today in the movement disorders clinic for neurologic consultation at the request of Andrea Redwood, MD and Dr. Leta Cannon.  The records that were made available to me were reviewed.  The consultation is for the evaluation of PD.  This patient is accompanied in the office by her daughter who supplements the history.    Patient notes from 2012 indicate that she had a 10-year history at that time of tremor and had been treated with primidone.  She also was reporting confusion and progressive memory loss in 2012.  She was being treated for anxiety and depression as well.  In 2013, she was placed on carbidopa levodopa, and records indicate that the initiation of this was beneficial.  By 2014, the patient was describing motor fluctuations.  At the end of 2014, she was placed on amantadine.  She felt she had side effects of confusion with this medication.  She did not take it for long.  When she came back and followed up in early 2015, she reported that she had had some hallucinations.  It is unclear if these occurred when she was on amantadine.  In February, 2016, she was transitioned to Rytary 95 mg, 3 tablets 3 times per day.  The dosage was increased to 4 tablets 3 times per day in October, 2016 until she complained about more visual hallucinations and this was backed down again to 3 tablets 3 times a day in January, 2017.  Nuplazid was started in May, 2017 for the same.  She never took that.  She ultimately was hospitalized and was placed on Risperdal in Aug, 2017.  Her rytary was increased this year to 145 mg, 3 po tid (6am/2pm/10pm). she saw Dr. Si Raider and had neurocognitive testing on October 25, 2017.  She has not returned yet for results, but I did talk to Dr. Si Raider about those.  She has  mild to moderate dementia.  She also has anxiety and depression.  Specific Symptoms:  Tremor: Yes.   (L arm mostly, occasionally on the R arm.  Also inner tremor) Family hx of similar:  Yes.    - father, sister with PD Voice: softer Sleep: sleeping well  Vivid Dreams:  No.  Acting out dreams:  No. Wet Pillows: No. Postural symptoms:  Yes.    Falls?  No. Bradykinesia symptoms: slow movements and difficulty getting out of a chair Loss of smell:  Yes.   Loss of taste:  Yes.   Urinary Incontinence:  Yes.  , wears depends Difficulty Swallowing:  rare Handwriting, micrographia: Yes.   Trouble with ADL's:  Yes.    Trouble buttoning clothing: Yes.   Depression:  No. per patient; yes per daughter Memory changes:  Yes.   (lives with daughter since 07/2016) Hallucinations:  Yes.   (still having these - all sorts of children, bugs - she occasionally thinks that they are real.  Happen day and night.  Has called 911 because of these.  Daughter would like her off of Risperdal but would like to transition her to something else with)  visual distortions: Yes.   N/V:  No. Lightheaded:  Yes.    Syncope: No. Diplopia:  No. Dyskinesia:  No.  Neuroimaging of the brain has previously been performed.  CT brain done in 2017 showed mild small vessel disease.  12/21/16 update: the patient is seen today in follow-up, accompanied by her daughters (two of them) who supplements the history.  I have reviewed numerous records  made available to me.  Patient was hospitalized since our last visit at [facility] behavioral health because of delusions and hallucinations.  Patient was admitted from November 26, 2017 to December 06, 2017.  She was discharged on quetiapine, 25 mg at 8 AM and 5 PM and quetiapine 100 mg at bedtime.  The day of discharge, she became angry at a staff member and scratched them and therefore, Depakote, 125 mg 3 times per day was added with meals the day of discharge.  Daughters state that delusions and hallucinations are still present and agitation is there if she is fearful.  Her daughter had to give her a xanax the other day.  They are looking at alterative living situations as her family is  tired.  Still on rytary, 145 mg, 3 po tid.  Needs assistance dressing.  Could not do out patient therapy now.  Needs home therapy.    02/21/18 update: Patient is seen today in follow-up.  She is accompanied by her daughters who supplements history.  The patient is currently on Rytary 145 mg, which was decreased last visit to 2 tablets 3 times per day because of significant hallucinations.  She takes the last 2 at bedtime.  This is primarily because tremor will awaken her from sleep.  She is on quetiapine for sleep and hallucinations.  This was increased last visit.  She is on 25 mg at 8 AM and 5 PM and 150 mg at night.  She is also on Depakote, 250 mg twice per day.  This was also an increase compared to last visit.  They were encouraged multiple times last visit, and since last visit, to schedule an appointment with psychiatry.  PREVIOUS MEDICATIONS:  amantadine, primidone, carbidopa/levodopa, rytary  ALLERGIES:   Allergies  Allergen Reactions  . Aspirin Anaphylaxis    swelling  . Maxitrol [Neomycin-Polymyxin-Dexameth] Itching and Other (See Comments)    Redness to the eye    CURRENT MEDICATIONS:  Outpatient Encounter Medications as of 02/21/2018  Medication Sig  . ALPRAZolam (XANAX) 0.25 MG tablet Take 0.25 mg by mouth 2 (two) times daily as needed for anxiety. Take 1/2- 1 tab twice daily as needed  . Carbidopa-Levodopa ER (RYTARY) 36.25-145 MG CPCR Take 3 capsules by mouth 3 (three) times daily.  . divalproex (DEPAKOTE) 250 MG DR tablet Take 1 tablet (250 mg total) by mouth 2 (two) times daily.  . QUEtiapine (SEROQUEL) 100 MG tablet quetiapine 100 mg tablet  TK 1 T PO HS  . QUEtiapine (SEROQUEL) 25 MG tablet Take 25 mg by mouth 2 (two) times daily.  . rivastigmine (EXELON) 9.5 mg/24hr Place onto the skin.  Marland Kitchen sertraline (ZOLOFT) 100 MG tablet Take 100 mg by mouth daily.  Marland Kitchen tiZANidine (ZANAFLEX) 4 MG tablet TAKE 1 TABLET(4 MG) BY MOUTH EVERY 8 HOURS AS NEEDED FOR MUSCLE SPASMS   No  facility-administered encounter medications on file as of 02/21/2018.     PAST MEDICAL HISTORY:   Past Medical History:  Diagnosis Date  . Anxiety   . Coronary artery disease    Mid LAD Stent 2001 Patent By cath on 08/10/2010  . 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 Diabetes mellitus    Type II  . Dyslipidemia   . GERD (gastroesophageal reflux disease)   . HTN (hypertension)   . Parkinson disease ([place])     PAST SURGICAL HISTORY:   Past Surgical History:  Procedure Laterality Date  . APPENDECTOMY    . CARDIAC CATHETERIZATION  07/02/2000  normal LV function, 40-50% prox smooth LAD stenosis between 1st and 2nd diagonal, normal Cfx & RCA (Dr. Corky Downs)  . CARDIAC CATHETERIZATION  02/20/2001   normal LV function, 50% stenosis prox to prox 3.5x71mm Nir Elite stent, 80% stenosis in a 1st septal perforating artery (Dr. Corky Downs)  . CARDIAC CATHETERIZATION  06/18/2006   patent LAD stent, dominant Cfx, low-normal EF (Dr. Domenic Moras)  . CARDIAC CATHETERIZATION  08/10/2010   patent mid LAD stent, nonobstructive CAD (Dr. Alma Friendly)  . CHOLECYSTECTOMY  1963  . CORONARY ANGIOPLASTY WITH STENT PLACEMENT  10/31/2000   single vessel CAD (mild degree in mid-LAD 60% stenosis) - predilatation of plaque in mid LAD with cutting balloon angioplasty; 3.0x53mm Nir Elite stent to mid-LAD (Dr. Domenic Moras)  . HYSTERECTOMY  1989  . NM MYOCAR PERF WALL MOTION  2012   lexiscan - normal perfusion, EF 71%, low risk  . TRANSTHORACIC ECHOCARDIOGRAM  2012   EF 60-65%, mild AV regurg, LA mildly dilated, PA peak pressure 17mmHg    SOCIAL HISTORY:   Social History   Socioeconomic History  . Marital status: Widowed    Spouse name: Not on file  . Number of children: 3  . Years of education: 32  . Highest education level: Not on file  Social Needs  . Financial resource strain: Not on file  . Food insecurity - worry: Not on file  . Food insecurity - inability: Not on file  . Transportation needs - medical: Not  on file  . Transportation needs - non-medical: Not on file  Occupational History  . Occupation: Retired    Comment: Metallurgist  Tobacco Use  . Smoking status: Never Smoker  . Smokeless tobacco: Never Used  Substance and Sexual Activity  . Alcohol use: No  . Drug use: No  . Sexual activity: Not on file  Other Topics Concern  . Not on file  Social History Narrative   Lives with daughter [name] and son in law   Caffeine Use- 3 to 4 cups daily    FAMILY HISTORY:   Family Status  Relation Name Status  . Father  Deceased at age 35  . Mother  43       34yrs old  . Sister  Alive  . Cousin first x 2-sisters Deceased  . Other nephew Alive  . Daughter 2 Alive  . Son  Alive    ROS:  A complete 10 system review of systems was obtained and was unremarkable apart from what is mentioned above.  PHYSICAL EXAMINATION:    VITALS:   There were no vitals filed for this visit.  GEN:  The patient appears stated age and is in NAD. HEENT:  Normocephalic, atraumatic.  The mucous membranes are moist. The superficial temporal arteries are without ropiness or tenderness. CV:  RRR Lungs:  CTAB Neck/HEME:  There are no carotid bruits bilaterally.  Neurological examination:  Orientation: The patient is alert and oriented to person and place.  She is confused and occasionally interjects things that don't make sense into the conversation.  MoCA deferred given just had neurocognitive testing performed. Cranial nerves: There is good facial symmetry. The speech is fluent and clear. Soft palate rises symmetrically and there is no tongue deviation. Hearing is intact to conversational tone. Sensation: Sensation is intact to light touch x 4 Motor: Strength is 5/5 in the bilateral upper and lower extremities.   Shoulder shrug is equal and symmetric.  There is no pronator drift.  Movement examination: Tone: There is normal tone today Abnormal movements: There is LUE resting tremor that is  overall mild Coordination:  There is decremation with RAM's, with any form of RAMS, including alternating supination and pronation of the forearm, hand opening and closing, finger taps, heel taps and toe taps. Gait and Station: The patient pushes off the chair with her hands.  She is mildly unstable with her cane.  ASSESSMENT/PLAN:  1.  Idiopathic Parkinson's disease.  The patient was diagnosed in approximately 2013  -We discussed the diagnosis as well as pathophysiology of the disease.  We discussed treatment options as well as prognostic indicators.  Patient education was provided.  -We discussed that it used to be thought that levodopa would increase risk of melanoma but now it is believed that Parkinsons itself likely increases risk of melanoma. she is to get regular skin checks.  -She looks fairly optimally treated with rytary 145 mg, 3 tablets 3 times per day but she is having so many hallucinations that I will drop this a little to rytary, 145mg , 2 tablets 3 times per day.  She takes the last one at bedtime.  When she tried to move that closer up in the day, she had tremor at night that was awakening her.  She will continue with this medication.  Risks, benefits, side effects and alternative therapies were discussed.  The opportunity to ask questions was given and they were answered to the best of my ability.  The patient expressed understanding and willingness to follow the outlined treatment protocols.  -I will send home therapy, PT/OT/social work to the home  -they are working with attorney to get medicaid to allow for SNF   2.  Parkinson's disease dementia with hallucinations  -Patient had neurocognitive testing in November, 2018.  This was consistent with mild to moderate dementia.  -I had a long discussion with patient and her daughter.  She is currently on quetiapine, 25 mg at 8 AM and 5 PM and ***150 mg at bed.  We did talk about the fact that the atypical antipsychotic medications are  not indicated for dementia related psychosis and increased risk of mortality in the elderly, usually because of infectious or  cardiac related. Understanding is expressed and they were agreeable that the benefits outweigh the risks in this case.  -The patient is also on on VPA - 250 mg bid.   I expressed to the patient and her daughter that she needed psychiatric care and needed to be under the care of a psychiatrist, along with me.  Expressed the concept of team care with them.  -Patient is on the Exelon patch. She will continue that  3.  Anxiety and depression  -I think that counseling would be of value.  This has been long-standing, according to records.    4.  ***   Cc:  Andrea Redwood, MD

## 2018-02-21 ENCOUNTER — Ambulatory Visit: Payer: Self-pay | Admitting: Neurology

## 2018-02-27 DIAGNOSIS — R443 Hallucinations, unspecified: Secondary | ICD-10-CM | POA: Diagnosis not present

## 2018-02-27 DIAGNOSIS — E1140 Type 2 diabetes mellitus with diabetic neuropathy, unspecified: Secondary | ICD-10-CM | POA: Diagnosis not present

## 2018-02-27 DIAGNOSIS — G20 Parkinson's disease: Secondary | ICD-10-CM | POA: Diagnosis not present

## 2018-02-27 DIAGNOSIS — F0391 Unspecified dementia with behavioral disturbance: Secondary | ICD-10-CM | POA: Diagnosis not present

## 2018-02-27 DIAGNOSIS — Z6828 Body mass index (BMI) 28.0-28.9, adult: Secondary | ICD-10-CM | POA: Diagnosis not present

## 2018-03-18 ENCOUNTER — Encounter: Payer: Self-pay | Admitting: Psychology

## 2018-03-19 DIAGNOSIS — H6121 Impacted cerumen, right ear: Secondary | ICD-10-CM | POA: Diagnosis not present

## 2018-03-19 DIAGNOSIS — J342 Deviated nasal septum: Secondary | ICD-10-CM | POA: Diagnosis not present

## 2018-03-27 DIAGNOSIS — F0391 Unspecified dementia with behavioral disturbance: Secondary | ICD-10-CM | POA: Diagnosis not present

## 2018-04-02 ENCOUNTER — Telehealth: Payer: Self-pay | Admitting: Hospice and Palliative Medicine

## 2018-04-02 DIAGNOSIS — R82998 Other abnormal findings in urine: Secondary | ICD-10-CM | POA: Diagnosis not present

## 2018-04-02 DIAGNOSIS — F0390 Unspecified dementia without behavioral disturbance: Secondary | ICD-10-CM | POA: Diagnosis not present

## 2018-04-02 NOTE — Telephone Encounter (Signed)
TC from daughter requesting a call back, reporting that her mother did not sleep last night. Second call from daughter requesting a call back to discuss adding a dose of ativan. [facility] per daughter requested my recommendation.  Will return call soon

## 2018-04-10 DIAGNOSIS — G20 Parkinson's disease: Secondary | ICD-10-CM | POA: Diagnosis not present

## 2018-04-10 DIAGNOSIS — F0391 Unspecified dementia with behavioral disturbance: Secondary | ICD-10-CM | POA: Diagnosis not present

## 2018-04-10 DIAGNOSIS — Z6826 Body mass index (BMI) 26.0-26.9, adult: Secondary | ICD-10-CM | POA: Diagnosis not present

## 2018-04-11 ENCOUNTER — Telehealth: Payer: Self-pay | Admitting: Neurology

## 2018-04-11 NOTE — Telephone Encounter (Signed)
Confirmed med list. Dr. Carles Collet- fyi.

## 2018-04-11 NOTE — Telephone Encounter (Signed)
Received call from Dr. Brigitte Pulse yesterday afternoon re: pt.  Pt hospitalized at [facility] behavioral health (reviewed records).  He stated that pt was started on meds but after d/c daughter stopped most of them including backing rytary down to 2-3 capsules per day.  She did well for a while but now patient agitated and confused.  Dr. Brigitte Pulse asked what to do.  Told him I would restart seroquel but also told him we have requested many times that family go to psychiatry but instead of doing that, they end up as in patient.    [facility] d/c summary indicates that at d/c:  Xanax, depakote, exelon and seroquel were all discontinued.  rytary was 145 mg, 2 po tid at discharge.  zoloft was started 100 mg daily.  D/c from [place] psychiatrist, Dr. Geanie Kenning, specifically said not to make a follow up with her and to f/u with outpatient primary care instead.  I do see in treatment plans (but not d/c) that patient was to "increase loxatine to three times per day."  Dr. Brigitte Pulse stated not on antipsychotics when I talked with him yesterday, which is why decision to start seroquel.  Luvenia Starch, will you call patient daughter and get accurate med list please.

## 2018-04-12 ENCOUNTER — Other Ambulatory Visit: Payer: Self-pay

## 2018-04-12 NOTE — Patient Outreach (Signed)
[place] [facility]) Care Management  04/12/2018  DWAYNA KENTNER 1941/11/23 [id number]   Referral Date: 04/11/18 Referral Source: MD Office Referral Reason: To prevent re-hospitalization   Outreach Attempt #1 Telephone call to daughter [name] for MD referral.  No answer.  HIPAA compliant voice message left.    Plan: RN CM will send a letter and attempt again within 4 business days.     Jone Baseman, RN, MSN [facility] Care Management Care Management Coordinator Direct Line [phone] Toll Free: [phone]  Fax: [phone]

## 2018-04-13 ENCOUNTER — Encounter (HOSPITAL_COMMUNITY): Payer: Self-pay | Admitting: Emergency Medicine

## 2018-04-13 ENCOUNTER — Other Ambulatory Visit: Payer: Self-pay

## 2018-04-13 ENCOUNTER — Emergency Department (HOSPITAL_COMMUNITY)
Admission: EM | Admit: 2018-04-13 | Discharge: 2018-04-13 | Disposition: A | Discharge status: Home / Self Care | Payer: Medicare Other | Attending: Emergency Medicine | Admitting: Emergency Medicine

## 2018-04-13 ENCOUNTER — Emergency Department (HOSPITAL_COMMUNITY): Payer: Medicare Other

## 2018-04-13 DIAGNOSIS — I2510 Atherosclerotic heart disease of native coronary artery without angina pectoris: Secondary | ICD-10-CM | POA: Diagnosis not present

## 2018-04-13 DIAGNOSIS — R442 Other hallucinations: Secondary | ICD-10-CM | POA: Diagnosis not present

## 2018-04-13 DIAGNOSIS — F29 Unspecified psychosis not due to a substance or known physiological condition: Secondary | ICD-10-CM | POA: Diagnosis not present

## 2018-04-13 DIAGNOSIS — R443 Hallucinations, unspecified: Secondary | ICD-10-CM | POA: Diagnosis not present

## 2018-04-13 DIAGNOSIS — F0391 Unspecified dementia with behavioral disturbance: Secondary | ICD-10-CM | POA: Insufficient documentation

## 2018-04-13 DIAGNOSIS — R451 Restlessness and agitation: Secondary | ICD-10-CM | POA: Diagnosis not present

## 2018-04-13 DIAGNOSIS — G20 Parkinson's disease: Secondary | ICD-10-CM | POA: Insufficient documentation

## 2018-04-13 DIAGNOSIS — E119 Type 2 diabetes mellitus without complications: Secondary | ICD-10-CM | POA: Insufficient documentation

## 2018-04-13 DIAGNOSIS — I10 Essential (primary) hypertension: Secondary | ICD-10-CM | POA: Diagnosis not present

## 2018-04-13 DIAGNOSIS — R296 Repeated falls: Secondary | ICD-10-CM | POA: Insufficient documentation

## 2018-04-13 DIAGNOSIS — Z79899 Other long term (current) drug therapy: Secondary | ICD-10-CM | POA: Diagnosis not present

## 2018-04-13 DIAGNOSIS — F0390 Unspecified dementia without behavioral disturbance: Secondary | ICD-10-CM

## 2018-04-13 DIAGNOSIS — F0280 Dementia in other diseases classified elsewhere without behavioral disturbance: Secondary | ICD-10-CM | POA: Diagnosis present

## 2018-04-13 DIAGNOSIS — S0990XA Unspecified injury of head, initial encounter: Secondary | ICD-10-CM | POA: Diagnosis not present

## 2018-04-13 LAB — URINALYSIS, ROUTINE W REFLEX MICROSCOPIC
BACTERIA UA: NONE SEEN
Bilirubin Urine: NEGATIVE
Glucose, UA: NEGATIVE mg/dL
Hgb urine dipstick: NEGATIVE
Ketones, ur: NEGATIVE mg/dL
Nitrite: NEGATIVE
PROTEIN: NEGATIVE mg/dL
SPECIFIC GRAVITY, URINE: 1.008 (ref 1.005–1.030)
pH: 5 (ref 5.0–8.0)

## 2018-04-13 LAB — CBC WITH DIFFERENTIAL/PLATELET
BASOS PCT: 0 %
Basophils Absolute: 0 10*3/uL (ref 0.0–0.1)
EOS ABS: 0.2 10*3/uL (ref 0.0–0.7)
Eosinophils Relative: 2 %
HCT: 34.1 % — ABNORMAL LOW (ref 36.0–46.0)
HEMOGLOBIN: 10.9 g/dL — AB (ref 12.0–15.0)
LYMPHS ABS: 1.6 10*3/uL (ref 0.7–4.0)
Lymphocytes Relative: 23 %
MCH: 28.8 pg (ref 26.0–34.0)
MCHC: 32 g/dL (ref 30.0–36.0)
MCV: 90 fL (ref 78.0–100.0)
MONO ABS: 0.9 10*3/uL (ref 0.1–1.0)
MONOS PCT: 13 %
NEUTROS PCT: 62 %
Neutro Abs: 4.3 10*3/uL (ref 1.7–7.7)
Platelets: 223 10*3/uL (ref 150–400)
RBC: 3.79 MIL/uL — ABNORMAL LOW (ref 3.87–5.11)
RDW: 14 % (ref 11.5–15.5)
WBC: 7 10*3/uL (ref 4.0–10.5)

## 2018-04-13 LAB — COMPREHENSIVE METABOLIC PANEL
ALK PHOS: 66 U/L (ref 38–126)
ALT: 10 U/L — ABNORMAL LOW (ref 14–54)
AST: 18 U/L (ref 15–41)
Albumin: 3.5 g/dL (ref 3.5–5.0)
Anion gap: 10 (ref 5–15)
BUN: 16 mg/dL (ref 6–20)
CALCIUM: 9.3 mg/dL (ref 8.9–10.3)
CHLORIDE: 102 mmol/L (ref 101–111)
CO2: 24 mmol/L (ref 22–32)
CREATININE: 1.13 mg/dL — AB (ref 0.44–1.00)
GFR, EST AFRICAN AMERICAN: 52 mL/min — AB (ref >60)
GFR, EST NON AFRICAN AMERICAN: 45 mL/min — AB (ref >60)
Glucose, Bld: 126 mg/dL — ABNORMAL HIGH (ref 65–99)
Potassium: 3.7 mmol/L (ref 3.5–5.1)
Sodium: 136 mmol/L (ref 135–145)
Total Bilirubin: 0.3 mg/dL (ref 0.3–1.2)
Total Protein: 6.9 g/dL (ref 6.5–8.1)

## 2018-04-13 MED ORDER — LORAZEPAM 2 MG/ML IJ SOLN
2.0000 mg | Freq: Once | INTRAMUSCULAR | Status: AC
  Administered 2018-04-13: 2 mg via INTRAMUSCULAR
  Filled 2018-04-13: qty 1

## 2018-04-13 MED ORDER — LORAZEPAM 1 MG PO TABS: 1.0000 mg | ORAL_TABLET | Freq: Four times a day (QID) | ORAL | 0 refills | Status: AC | PRN

## 2018-04-13 NOTE — BHH Counselor (Signed)
Clinician attempted to complete pt's assessment. Clinician called pt's name however pt continued sleeping. Clinician noted from [place], RN pt was given 2mg  of Ativan about 30 minutes ago.   Clinician discussed disposition with Dr. Jeneen Rinks and expressed TTS consult to be completed during day shirt and social work to assist with placing pt in a nursing facility. Dr. Jeneen Rinks was in agreement.   Vertell Novak, MS, [facility], [facility] Triage Specialist [phone]

## 2018-04-13 NOTE — ED Notes (Signed)
Pts daughter at bedside at this time. Pts daughter updated on plan of care

## 2018-04-13 NOTE — BH Assessment (Signed)
[facility] Assessment Progress Note  Lord DNP evaluated patient and staffed with Akintayo MD who determined patient did not meet psychiatric criteria for a admission. CSW will consult with patient also.

## 2018-04-13 NOTE — BH Assessment (Addendum)
Assessment Note  [name] is an 77 y.o. female that presents this date with a history of dementia, Parkinson's and psychosis. Patient was observed to be drowsy during assessment and due to altered mental status was not able to participate in the assessment. Patient would just shake her head "no" to all of this writer's questions. This writer attempted to contact patient's daughter's [name] [phone] and Dorian Pod [phone]) unsuccessfully to gather collateral information. This Probation officer utilized admission notes and prior record review to complete assessment. Per earlier note, "Patient presented with progressive symptoms associated with dementia. Patient has developed episodes of agitation and hallucinations and was noted to been admitted to [facility] in December 2018. Collateral information obtained earlier from daughter stated that "they were wonderful with her" at [facility]. However she states that they were frustrated that they were not able to find antipsychotic or sedative medications that either worked, or had no side effects with her mother. Ultimately, the family decided to try to keep her at home. She has been home for a month and is getting palliative care. However, has had increasing difficulty with sleep, more difficulty with balance and several falls. No reported medical complaints, injuries, or acute illness. Daughter reports that her mother has not been sleeping and not eating much. Has been having more episodes of agitation. In the first few weeks while she was at home she was having "nonthreatening hallucinations" i.e. was seeing children etc. Now is starting to say "things coming out of the wall". Daughter states that there is a palliative care nurse that is been helping. She also has helps at home. Daughter states that her mother was not sleeping tonight and was still up and wandering. The daughter states that she is "exhausted". She does not feel that she  can safely care for her mother at home. She spoke with the palliative care nurse who encouraged her to "call 911, take her to the emergency room, so that she can be placed in a nursing home". Lord DNP evaluated patient and staffed with Darleene Cleaver MD who determined patient did not meet psychiatric criteria for a admission. CSW will consult with patient also.  Diagnosis: Dementia F03.09 (per notes)  Past Medical History:  Past Medical History:  Diagnosis Date  . Anxiety   . Coronary artery disease    Mid LAD Stent 2001 Patent By cath on 08/10/2010  . 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 Diabetes mellitus    Type II  . Dyslipidemia   . GERD (gastroesophageal reflux disease)   . HTN (hypertension)   . Parkinson disease [facility])     Past Surgical History:  Procedure Laterality Date  . APPENDECTOMY    . CARDIAC CATHETERIZATION  07/02/2000   normal LV function, 40-50% prox smooth LAD stenosis between 1st and 2nd diagonal, normal Cfx & RCA (Dr. Corky Downs)  . CARDIAC CATHETERIZATION  02/20/2001   normal LV function, 50% stenosis prox to prox 3.5x75mm Nir Elite stent, 80% stenosis in a 1st septal perforating artery (Dr. Corky Downs)  . CARDIAC CATHETERIZATION  06/18/2006   patent LAD stent, dominant Cfx, low-normal EF (Dr. Domenic Moras)  . CARDIAC CATHETERIZATION  08/10/2010   patent mid LAD stent, nonobstructive CAD (Dr. Alma Friendly)  . CHOLECYSTECTOMY  1963  . CORONARY ANGIOPLASTY WITH STENT PLACEMENT  10/31/2000   single vessel CAD (mild degree in mid-LAD 60% stenosis) - predilatation of plaque in mid LAD with cutting balloon angioplasty; 3.0x53mm Nir Elite stent to mid-LAD (Dr.  Domenic Moras)  . HYSTERECTOMY  1989  . NM MYOCAR PERF WALL MOTION  2012   lexiscan - normal perfusion, EF 71%, low risk  . TRANSTHORACIC ECHOCARDIOGRAM  2012   EF 60-65%, mild AV regurg, LA mildly dilated, PA peak pressure 83mmHg    Family History:  Family History  Problem Relation Age of Onset  . Heart failure Father   .  Parkinson's disease Father   . Stroke Mother   . Parkinson's disease Sister   . ALS Cousin     Social History:  reports that she has never smoked. She has never used smokeless tobacco. She reports that she does not drink alcohol or use drugs.  Additional Social History:  Alcohol / Drug Use Pain Medications: see PTA meds Prescriptions: see PTA meds Over the Counter: see PTA meds History of alcohol / drug use?: No history of alcohol / drug abuse Longest period of sobriety (when/how long): NA Negative Consequences of Use: (NA) Withdrawal Symptoms: (NA)  CIWA: CIWA-Ar BP: (!) 105/57 Pulse Rate: (!) 57 COWS:    Allergies:  Allergies  Allergen Reactions  . Aspirin Anaphylaxis    swelling  . Maxitrol [Neomycin-Polymyxin-Dexameth] Itching and Other (See Comments)    Redness to the eye    Home Medications:  (Not in a hospital admission)  OB/GYN Status:  No LMP recorded. Patient has had a hysterectomy.  General Assessment Data Assessment unable to be completed: Yes Reason for not completing assessment: Clinician attempted to complete pt's assessment. Clinician called pt's name however pt continued sleeping. Clinician noted from [place], RN pt was given 2mg  of Ativan about 30 minutes ago.  [facility] of Assessment: [facility] TTS Assessment: In system Is this a Tele or Face-to-Face Assessment?: Face-to-Face Is this an Initial Assessment or a Re-assessment for this encounter?: Initial Assessment Marital status: Widowed [place] name: NA Is patient pregnant?: No Pregnancy Status: No Living Arrangements: Children(Per notes ) Can pt return to current living arrangement?: Yes Admission Status: Voluntary Is patient capable of signing voluntary admission?: Yes Referral Source: Self/Family/Friend Insurance type: Medicare  Medical Screening Exam ([place]) Medical Exam completed: Yes  Crisis Care Plan Living Arrangements: Children(Per notes ) Legal Guardian: (NA) Name of  Psychiatrist: None Name of Therapist: None  Education Status Is patient currently in school?: No Is the patient employed, unemployed or receiving disability?: Unemployed  Risk to self with the past 6 months Suicidal Ideation: No(Per notes) Has patient been a risk to self within the past 6 months prior to admission? : No Suicidal Intent: No Has patient had any suicidal intent within the past 6 months prior to admission? : No Is patient at risk for suicide?: No Suicidal Plan?: No Has patient had any suicidal plan within the past 6 months prior to admission? : No Access to Means: No What has been your use of drugs/alcohol within the last 12 months?: Denies Previous Attempts/Gestures: No How many times?: 0(per notes) Other Self Harm Risks: (Fall risk) Triggers for Past Attempts: Unknown Intentional Self Injurious Behavior: None Family Suicide History: No Recent stressful life event(s): Other (Comment)(Increased symptoms of dementia) Persecutory voices/beliefs?: No Depression: (UTA) Depression Symptoms: (UTA) Substance abuse history and/or treatment for substance abuse?: No Suicide prevention information given to non-admitted patients: Not applicable  Risk to Others within the past 6 months Homicidal Ideation: No Does patient have any lifetime risk of violence toward others beyond the six months prior to admission? : No Thoughts of Harm to Others: No Current Homicidal Intent:  No Current Homicidal Plan: No Access to Homicidal Means: No Identified Victim: NA History of harm to others?: No Assessment of Violence: None Noted Violent Behavior Description: NA Does patient have access to weapons?: No Criminal Charges Pending?: No Does patient have a court date: No Is patient on probation?: No  Psychosis Hallucinations: Auditory, Visual Delusions: None noted  Mental Status Report Appearance/Hygiene: Unremarkable Eye Contact: Poor Motor Activity: Unremarkable Speech: Soft,  Slow Level of Consciousness: Drowsy Mood: (UTA) Affect: Unable to Assess Anxiety Level: Minimal Thought Processes: (UTA) Judgement: (UTA) Orientation: (UTA) Obsessive Compulsive Thoughts/Behaviors: None  Cognitive Functioning Concentration: (UTA) Memory: (UTA) Is patient IDD: (UTA) Is patient DD?: No Insight: Unable to Assess Impulse Control: Unable to Assess Appetite: (UTA) Have you had any weight changes? : (UTA) Sleep: (UTA) Total Hours of Sleep: (UTA) Vegetative Symptoms: Unable to Assess  ADLScreening [facility] Assessment Services) Patient's cognitive ability adequate to safely complete daily activities?: Yes Patient able to express need for assistance with ADLs?: Yes Independently performs ADLs?: Yes (appropriate for developmental age)  Prior Inpatient Therapy Prior Inpatient Therapy: Yes Prior Therapy Dates: 2017, 2018 Prior Therapy Facilty/Provider(s): [facility] Reason for Treatment: Dementia  Prior Outpatient Therapy Prior Outpatient Therapy: Yes Prior Therapy Dates: Ongoing Prior Therapy Facilty/Provider(s): [facility] MD(Per notes) Reason for Treatment: Neurology Does patient have an ACCT team?: No Does patient have Intensive In-House Services?  : No Does patient have [facility] services? : No Does patient have P4CC services?: No  ADL Screening (condition at time of admission) Patient's cognitive ability adequate to safely complete daily activities?: Yes Is the patient deaf or have difficulty hearing?: No Does the patient have difficulty seeing, even when wearing glasses/contacts?: No Does the patient have difficulty concentrating, remembering, or making decisions?: Yes Patient able to express need for assistance with ADLs?: Yes Does the patient have difficulty dressing or bathing?: No Independently performs ADLs?: Yes (appropriate for developmental age) Does the patient have difficulty walking or climbing stairs?: Yes Weakness of Legs: Both Weakness of  Arms/Hands: None  Home Assistive Devices/Equipment Home Assistive Devices/Equipment: None  Therapy Consults (therapy consults require a physician order) PT Evaluation Needed: No OT Evalulation Needed: No SLP Evaluation Needed: No Abuse/Neglect Assessment (Assessment to be complete while patient is alone) Physical Abuse: Denies Verbal Abuse: Denies Sexual Abuse: Denies Exploitation of patient/patient's resources: Denies Self-Neglect: Denies Values / Beliefs Cultural Requests During Hospitalization: None Spiritual Requests During Hospitalization: None Consults Spiritual Care Consult Needed: No Social Work Consult Needed: No Regulatory affairs officer (For Healthcare) Does Patient Have a Medical Advance Directive?: No Would patient like information on creating a medical advance directive?: No - Patient declined Nutrition Screen- [facility] Adult/[facility] Patient's home diet: (pt. given meal tray.)  Additional Information 1:1 In Past 12 Months?: No CIRT Risk: No Elopement Risk: No Does patient have medical clearance?: Yes     Disposition: Lord DNP evaluated patient and staffed with Akintayo MD who determined patient did not meet psychiatric criteria for a admission. CSW will consult with patient also.  Disposition Initial Assessment Completed for this Encounter: Yes Disposition of Patient: Discharge(Pt has been cleared by Psychiatry) Patient refused recommended treatment: No Mode of transportation if patient is discharged?: Car  On Site Evaluation by:   Reviewed with Physician:    Mamie Nick 04/13/2018 1:05 PM

## 2018-04-13 NOTE — ED Provider Notes (Signed)
Blood pressure (!) 94/54, pulse 61, temperature 98.1 F (36.7 C), temperature source Oral, resp. rate 16, SpO2 99 %.  Assuming care from Dr. Jeneen Rinks.  In short, [name] is a 77 y.o. female with a chief complaint of Nursing Home Placement .  Refer to the original H&P for additional details.  The current plan of care is to f/u after SW evaluation.  10:20 AM Spoke with SW who evaluated the patient and discussed situation with daughter. Daughter is concerned that recent behavior change could be related to medication changes vs disease progression. Requesting psychiatry evaluation for input. Placed TTS evaluation and will continue to follow.   02:30 PM She will work and TTS have now completed their evaluations.  They will be unable to place the patient in [facility] or skilled nursing/memory care from the emergency department.  They had discussions with the patient's family who will come to pick up the patient.  I did provide additional Ativan for use at home as needed for agitation while the patient pursues placement from home.   Nanda Quinton, MD   Margette Fast, MD 04/13/18 [phone]

## 2018-04-13 NOTE — ED Notes (Signed)
Bed: MB84 Expected date:  Expected time:  Means of arrival:  Comments: EMS 77 yo female for Nursing home placement/on Pallative Care-unable to care for her at home

## 2018-04-13 NOTE — ED Triage Notes (Addendum)
Patient arrives by [facility]. They state patient has end stage dementia. Patient had been placed on anti-psychotic medications while at [facility] had frequent falls since so the daughter who is an Therapist, sports discontinued all her Mother's psychiatric medications. EMS states patient has fallen 3 times tonight. Patient has a Palliative Care Nurse who told daughter to call 911 and have her brought to the ED and have her placed in a Nursing Home. Patient has no injuries from the falls.

## 2018-04-13 NOTE — Discharge Instructions (Addendum)
You were seen in the ED today with complications from dementia. The psychiatry and social work teams evaluated you and are unable to place you in a nursing facility from the Emergency Department. I have provided a list of outpatient providers and written a prescription for Ativan to use as needed for agitation.

## 2018-04-13 NOTE — ED Provider Notes (Addendum)
[facility] DEPT Provider Note   CSN: [id number] Arrival date & time: 04/13/18  0324     History   Chief Complaint Chief Complaint  Patient presents with  . Nursing Home Placement    HPI [name] is a 77 y.o. female.   chief complaint is agitation, falls.  HPI 77 year old female.  History of dementia, Parkinson's, psychosis.  Was having progression of her symptoms starting last fall.  Developed episodes of agitation and hallucinations.  By December was hospitalized.  Spent several weeks/months at [facility] psychiatric inpatient.  Daughter states that "they were wonderful with her" at [place].  However she states that they were frustrated that they were not able to find antipsychotic or sedative medications that either worked, or had no side effects with her mother.  Ultimately, the family decided to try to keep her at home.  She has been home for a month.  Is getting palliative care.  However, has had increasing difficulty with sleep, more difficulty with balance and several falls.  No reported medical complaints, injuries, or acute illness.  Daughter reports that her mother has not been sleeping.  Is not eating much.  Has been having more episodes of agitation.  In the first few weeks while she was at home she was having "nonthreatening hallucinations" i.e. was seeing children etc.  Now is starting to say "things coming out of the wall".  Daughter states that there is a palliative care nurse that is been helping.  She also has helps at home.  Daughter states that her mother was not sleeping tonight and was still up and wandering.  The daughter states that she is "exhausted".  She does not feel that she can safely care for her mother at home [place] help to prevent her from falling.  She spoke with the palliative care nurse who encouraged her to "call 911, take her to the emergency room, so that she can be placed in a nursing home".  Past Medical  History:  Diagnosis Date  . Anxiety   . Coronary artery disease    Mid LAD Stent 2001 Patent By cath on 08/10/2010  . 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 Diabetes mellitus    Type II  . Dyslipidemia   . GERD (gastroesophageal reflux disease)   . HTN (hypertension)   . Parkinson disease [facility])     Patient Active Problem List   Diagnosis Date Noted  . Dementia due to Parkinson's disease without behavioral disturbance ([place]) 11/13/2017  . Weakness generalized 06/17/2017  . Orthostatic hypotension 06/17/2017  . Chest tightness 06/17/2017  . Dyspnea 06/17/2017  . Edema of both legs 06/17/2017  . Mild dementia 11/03/2015  . Chest pain 10/03/2015  . Type 2 diabetes mellitus with hemoglobin A1c goal of less than 7.5% ([place]) 10/03/2015  . Hyperlipidemia LDL goal <70 09/25/2014  . Adjustment disorder with mixed anxiety and depressed mood 05/09/2013  . PD (Parkinson's disease) ([place]) 03/25/2013  . OSA (obstructive sleep apnea) 03/25/2013  . Generalized anxiety disorder 03/25/2013  . Coronary artery disease 03/25/2013  . Diabetes ([place]) 03/25/2013    Past Surgical History:  Procedure Laterality Date  . APPENDECTOMY    . CARDIAC CATHETERIZATION  07/02/2000   normal LV function, 40-50% prox smooth LAD stenosis between 1st and 2nd diagonal, normal Cfx & RCA (Dr. Corky Downs)  . CARDIAC CATHETERIZATION  02/20/2001   normal LV function, 50% stenosis prox to prox 3.5x32mm Nir Elite stent, 80% stenosis in a  1st septal perforating artery (Dr. Corky Downs)  . CARDIAC CATHETERIZATION  06/18/2006   patent LAD stent, dominant Cfx, low-normal EF (Dr. Domenic Moras)  . CARDIAC CATHETERIZATION  08/10/2010   patent mid LAD stent, nonobstructive CAD (Dr. Alma Friendly)  . CHOLECYSTECTOMY  1963  . CORONARY ANGIOPLASTY WITH STENT PLACEMENT  10/31/2000   single vessel CAD (mild degree in mid-LAD 60% stenosis) - predilatation of plaque in mid LAD with cutting balloon angioplasty; 3.0x93mm Nir Elite stent to mid-LAD (Dr.  Domenic Moras)  . HYSTERECTOMY  1989  . NM MYOCAR PERF WALL MOTION  2012   lexiscan - normal perfusion, EF 71%, low risk  . TRANSTHORACIC ECHOCARDIOGRAM  2012   EF 60-65%, mild AV regurg, LA mildly dilated, PA peak pressure 36mmHg     OB History   None      Home Medications    Prior to Admission medications   Medication Sig Start Date End Date Taking? Authorizing Provider  Carbidopa-Levodopa ER (RYTARY) 36.25-145 MG CPCR Take 3 capsules by mouth 3 (three) times daily. Patient taking differently: Take 1 capsule by mouth 3 (three) times daily.  08/15/17   Penumalli, Earlean Polka, MD  docusate sodium (COLACE) 100 MG capsule Take 100 mg by mouth as needed for mild constipation.    [provider]  furosemide (LASIX) 20 MG tablet Take 20 mg by mouth as needed.    [provider]  LORazepam (ATIVAN) 0.5 MG tablet Can take 2 TID, but usually takes 1 at bedtime 03/22/18   [provider]  LORazepam (ATIVAN) 2 MG/ML injection Inject into the muscle. Has only used once 02/21/18   [provider]  QUEtiapine (SEROQUEL) 25 MG tablet Take 50 mg by mouth 2 (two) times daily.  12/06/17   [provider]  sertraline (ZOLOFT) 100 MG tablet Take 100 mg by mouth daily. 08/25/17   [provider]  traZODone (DESYREL) 100 MG tablet Take 100 mg by mouth at bedtime.    [provider]    Family History Family History  Problem Relation Age of Onset  . Heart failure Father   . Parkinson's disease Father   . Stroke Mother   . Parkinson's disease Sister   . ALS Cousin     Social History Social History   Tobacco Use  . Smoking status: Never Smoker  . Smokeless tobacco: Never Used  Substance Use Topics  . Alcohol use: No  . Drug use: No     Allergies   Aspirin and Maxitrol [neomycin-polymyxin-dexameth]   Review of Systems Review of Systems  Unable to perform ROS: Dementia  Constitutional: Positive for activity change, appetite change and  unexpected weight change.  Neurological: Positive for weakness.  Psychiatric/Behavioral: Positive for agitation, behavioral problems, confusion, hallucinations and sleep disturbance.     Physical Exam Updated Vital Signs BP (!) 144/69   Pulse 67   Temp 98.1 F (36.7 C) (Oral)   Resp 18   SpO2 97%   Physical Exam  Constitutional: No distress.  Elderly appearing.  Not thin.  HENT:  Head: Normocephalic.  Eyes: Pupils are equal, round, and reactive to light. Conjunctivae are normal. No scleral icterus.  Neck: Normal range of motion. Neck supple. No thyromegaly present.  Cardiovascular: Normal rate and regular rhythm. Exam reveals no gallop and no friction rub.  No murmur heard. Pulmonary/Chest: Effort normal and breath sounds normal. No respiratory distress. She has no wheezes. She has no rales.  Abdominal: Soft. Bowel sounds are normal.  She exhibits no distension. There is no tenderness. There is no rebound.  Musculoskeletal: Normal range of motion.  Neurological:  Eyes closed.  Answers to voice with mumbling speech  Skin: Skin is warm and dry. No rash noted.  Psychiatric:  Restless.     ED Treatments / Results  Labs (all labs ordered are listed, but only abnormal results are displayed) Labs Reviewed  URINE CULTURE  CBC WITH DIFFERENTIAL/PLATELET  COMPREHENSIVE METABOLIC PANEL  URINALYSIS, ROUTINE W REFLEX MICROSCOPIC    EKG None  Radiology No results found.  Procedures Procedures (including critical care time)  Medications Ordered in ED Medications  LORazepam (ATIVAN) injection 2 mg (2 mg Intramuscular Given 04/13/18 0448)     Initial Impression / Assessment and Plan / ED Course  I have reviewed the triage vital signs and the nursing notes.  Pertinent labs & imaging results that were available during my care of the patient were reviewed by me and considered in my medical decision making (see chart for details).   Had a discussion with the patient's  daughter.  Plan will be medical evaluation.  Urine, CT of head as she has had multiple falls daily.  CBC and comprehensive.  Will ask for social work and psychiatric evaluation for the patient.  Given the patient Ativan here as she is restless and has not slept.  We do not have social worker act availability as it is over 400 a.m.  Had a realistic discussion with the daughter stating that most likely there would not be medical indication for admission and that she may require Geri psych readmission.  Daughter is fairly adamant that she wouldNOT like her mother to go back to [place].  Final Clinical Impressions(s) / ED Diagnoses   Final diagnoses:  Dementia with behavioral disturbance, unspecified dementia type    ED Discharge Orders    None       Tanna Furry, MD 04/13/18 0500    Tanna Furry, MD 04/13/18 [phone]

## 2018-04-13 NOTE — ED Notes (Signed)
Pt resting in bed. NAD noted. Equal rise and fall of chest noted. Will continue to monitor.

## 2018-04-13 NOTE — Progress Notes (Signed)
Per psychiatrist Akintayo and DNP Lord, patient psychiatrically cleared.   CSW updated patient's daughter, patient's daughter requested to speak with someone about why patient was psychiatrically cleared.  CSW informed DNP Lord, DNP agreed to speak with patient's daughter.   CSW and DNP Lord spoke with patient at bedside regarding patient's disposition that patient is psychiatrically cleared. DNP explained why patient was psychiatrically cleared, patient's daughter reported that she didn't understand why. Patient's daughter reported that she cant take patient home in her current condition. CSW explained that patient does not currently have a payor source for SNF [facility] term care to be placed from ED. CSW explained that patient would need medicaid pending, [facility] term care medicaid and or private pay. Patient's daughter reported that she knows that patient can be admitted to SNF without Medicaid pending because patient will be approved for Medicaid. CSW attempted to explain to patient's daughter that a payor source for SNF [facility] is needed to place patient from the ED as mentioned above. Patient's daughter inquired about why patient cannot go to inpatient to a geratric psychiatric facility and reported that she should have just requested that patient be taken to [facility] last night. CSW/DNP updated charge RN and patient's attending EDP.  CSW unable to place patient from ED to SNF for [facility] term care due to no payor source. Patient's daughter did not verbalize understanding and did not agree. Patient's daughter unable to recall SNF that would accept patient with no payor source.  CSW staffed case with [place] Surveyor, quantity. CSW AD confirmed that he was unaware of SNFs that would accept patient with no payor source. CSW unable to place patient from the ED. CSW signing off.  Abundio Miu, [place] Social Worker [facility] Cell#: [phone]

## 2018-04-13 NOTE — Progress Notes (Signed)
Patient evaluated by the psychiatrist and concurs she does not meet geriatric psychiatric admission.  Her daughter was at her bedside and requested to talk to this nurse practitioner.  She did not understand why she did not meet geriatric psych admission for psychiatric medication adjustment.  She was at [facility] geri-psych a month ago and when she took her home, she and her sister (primary care provider in [place]) felt it would be best to stop all of her psychiatric medications and did.  She reports she initially was better but then got worse however, she did not restart her psychiatric medications.  Attempted to explain to her that this was related to her dementia process and we could provide psychiatric medications but not an admission.  She stated, "No psychiatric medications worked."  Patient daughter requesting geriatric psych placement for medication management but patient daughter stated psychiatric medications do not work and refused our medication offers for psychiatric medications.  Psychiatrically cleared at this time.    Waylan Boga, PMHNP

## 2018-04-13 NOTE — ED Notes (Addendum)
Pt resting in bed. Equal rise and fall of chest noted. NAD noted. Pt appears to be sleeping. Pt difficult to arouse but will open eyes to firm touch. Pt has advanced dementia. Pt offered breakfast and lunch tray. Pt refusing food at this time, pt too drowsy to safely eat at this time. Will attempt again when pt is more alert.

## 2018-04-13 NOTE — Clinical Social Work Note (Signed)
Clinical Social Work Assessment  Patient Details  Name: [name] MRN: [id number] Date of Birth: 08/12/1941  Date of referral:  04/13/18               Reason for consult:  Facility Placement                Permission sought to share information with:    Permission granted to share information::     Name::        Agency::     Relationship::     Contact Information:     Housing/Transportation Living arrangements for the past 2 months:  Single Family Home Source of Information:  Adult Children([name] Midwife) Patient Interpreter Needed:  None Criminal Activity/Legal Involvement Pertinent to Current Situation/Hospitalization:  No - Comment as needed Significant Relationships:  Adult Children Lives with:  Adult Children Do you feel safe going back to the place where you live?  Patient's daughter reported that patient needs SNF level of care  Need for family participation in patient care:  Yes (Comment)  Care giving concerns:  Patient from home with daughter [name]). Patient's daughter reported that patient has a sitter at home that assists patient with ADLs. Patient's daughter reported that patient has had multiple falls recently.  Patient has a pending psychiatric consult.    Social Worker assessment / plan:  CSW spoke with patient's daughter regarding consult for SNF placement. Patient's daughter reported that patient never wanted to go to a SNF and that she has been trying to honor patient's wishes by keeping patient at home. Patient's daughter reported that her original plan for patient was to keep patient at home with hospice and to love on patient. Patient's daughter reported that she feels she cannot keep patient safe at home and that patient has had multiple falls recently. Patient's daughter reported that patient was at [facility] for 5 1/2 weeks and discharged home. Patient's daughter reported that she took patient off all her psychiatric medications  after being advised by her sister in [place] to see patient's baseline. Patient's daughter reported that patient has recently been a one person assistance and that she is followed by hospice and Palliative care in the home. Patient's daughter reported that she was advised by patient's Palliative NP that patient needs a nursing home and to bring patient to the hospital to be placed. CSW explained SNF placement process and different levels of care. CSW explained that the payor sources for [name] term care are [name] term care medicaid or private pay. Patient's daughter reported that patient is not able to private pay for [name] term care and that patient was denied for [name] term care medicaid because patient's income was too high. CSW explained to patient's daughter that without payor source patient was unable to be placed at SNF. Patient's daughter reported that she spoke with SW [name] at [name] who reported that there are multiple SNF with memory care units that take medicaid. CSW inquired about whether patient had medicaid, medicaid pending or patient was declined for medicaid. Patient's daughter reported that patient's medicaid was denied but she was told that the medicaid would be approved once patient was in a facility. CSW explained that patient would need to have medicaid or medicaid pending to initially be placed at SNF, patient's daughter verbalized no understanding. Patient's daughter stated "I just want to educate you that everyone qualifies for [name] term nursing care" but that patient makes too much to qualify for [name]  term care medicaid for ALF. Patient 's daughter reported that patient receives 1760/month and that patient has no property. CSW explained again that patient would need to a payor source to go to SNF. Patient's daughter reported that she does not know what to do because she is unable to take patient home and cannot pay for SNF. Patient's daughter requested psych consult to see if patient  was appropriate for gero psych placement. CSW agreed to ask patient's attending EDP. Patient's daughter reported that if she has to take patient home then she will need a medication that will sedate patient at night.  CSW updated patient's attending EDP. EDP agreed to psych consult.  CSW will follow up with patient's daughter after disposition is made after patient seen by psych.   Employment status:  Retired Forensic scientist:  Medicare PT Recommendations:  Not assessed at this time Information / Referral to community resources:     Patient/Family's Response to care:  Patient's daughter concerned about what to do with patient.   Patient/Family's Understanding of and Emotional Response to Diagnosis, Current Treatment, and Prognosis:  CSW spoke with patient's daughter regarding request for SNF placement. Patient's daughter involved in patient's care and verbalized uncertainty about patient's discharge plan. Patient's daughter reported that she is not able to care for patient and patient cannot return home in her current condition. CSW acknowledged patient's daughter's concerns and provided active listening. CSW provided psychoeducation about SNF placement process and payor sources. Patient's daughter informed CSW about what she has learned over the years regarding placement.   Emotional Assessment Appearance:  Appears stated age Attitude/Demeanor/Rapport:  Unable to Assess Affect (typically observed):  Unable to Assess Orientation:    Alcohol / Substance use:  Not Applicable Psych involvement (Current and /or in the community):  Yes (Comment)(Patient's daughter requested psychiatric consult )  Discharge Needs  Concerns to be addressed:  Care Coordination Readmission within the last 30 days:    Current discharge risk:  Physical Impairment Barriers to Discharge:  No Barriers Identified   Andrea Medin, LCSW 04/13/2018, 11:07 AM

## 2018-04-13 NOTE — ED Notes (Signed)
Social Worker at bedside speaking with daughter.

## 2018-04-14 LAB — URINE CULTURE: Culture: NO GROWTH

## 2018-04-15 ENCOUNTER — Telehealth: Payer: Self-pay

## 2018-04-15 ENCOUNTER — Other Ambulatory Visit: Payer: Self-pay | Admitting: Hospice and Palliative Medicine

## 2018-04-15 DIAGNOSIS — Z515 Encounter for palliative care: Secondary | ICD-10-CM

## 2018-04-15 DIAGNOSIS — F0280 Dementia in other diseases classified elsewhere without behavioral disturbance: Secondary | ICD-10-CM

## 2018-04-15 DIAGNOSIS — G20 Parkinson's disease: Secondary | ICD-10-CM

## 2018-04-15 NOTE — Telephone Encounter (Signed)
Received message to call patient's daughter. Phone call placed to daughter, [name]. [name] shared that patient has been declining and demonstrating pain issues. Would like to have NP visit. NP scheduled for today.

## 2018-04-15 NOTE — ACP (Advance Care Planning) (Signed)
DNR form and MOST form completed and in home

## 2018-04-15 NOTE — Progress Notes (Deleted)
[name] was seen today in the movement disorders clinic for neurologic consultation at the request of [name], MD and Dr. [name].  The records that were made available to me were reviewed.  The consultation is for the evaluation of PD.  This patient is accompanied in the office by her daughter who supplements the history.    Patient notes from 2012 indicate that she had a 10-year history at that time of tremor and had been treated with primidone.  She also was reporting confusion and progressive memory loss in 2012.  She was being treated for anxiety and depression as well.  In 2013, she was placed on carbidopa levodopa, and records indicate that the initiation of this was beneficial.  By 2014, the patient was describing motor fluctuations.  At the end of 2014, she was placed on amantadine.  She felt she had side effects of confusion with this medication.  She did not take it for long.  When she came back and followed up in early 2015, she reported that she had had some hallucinations.  It is unclear if these occurred when she was on amantadine.  In February, 2016, she was transitioned to Rytary 95 mg, 3 tablets 3 times per day.  The dosage was increased to 4 tablets 3 times per day in October, 2016 until she complained about more visual hallucinations and this was backed down again to 3 tablets 3 times a day in January, 2017.  Nuplazid was started in May, 2017 for the same.  She never took that.  She ultimately was hospitalized and was placed on Risperdal in Aug, 2017.  Her rytary was increased this year to 145 mg, 3 po tid (6am/2pm/10pm). she saw Dr. Si Raider and had neurocognitive testing on October 25, 2017.  She has not returned yet for results, but I did talk to Dr. Si Raider about those.  She has  mild to moderate dementia.  She also has anxiety and depression.  Specific Symptoms:  Tremor: Yes.   (L arm mostly, occasionally on the R arm.  Also inner tremor) Family hx of similar:  Yes.    - father, sister with PD Voice: softer Sleep: sleeping well  Vivid Dreams:  No.  Acting out dreams:  No. Wet Pillows: No. Postural symptoms:  Yes.    Falls?  No. Bradykinesia symptoms: slow movements and difficulty getting out of a chair Loss of smell:  Yes.   Loss of taste:  Yes.   Urinary Incontinence:  Yes.  , wears depends Difficulty Swallowing:  rare Handwriting, micrographia: Yes.   Trouble with ADL's:  Yes.    Trouble buttoning clothing: Yes.   Depression:  No. per patient; yes per daughter Memory changes:  Yes.   (lives with daughter since 07/2016) Hallucinations:  Yes.   (still having these - all sorts of children, bugs - she occasionally thinks that they are real.  Happen day and night.  Has called 911 because of these.  Daughter would like her off of Risperdal but would like to transition her to something else with)  visual distortions: Yes.   N/V:  No. Lightheaded:  Yes.    Syncope: No. Diplopia:  No. Dyskinesia:  No.  Neuroimaging of the brain has previously been performed.  CT brain done in 2017 showed mild small vessel disease.  12/21/16 update: the patient is seen today in follow-up, accompanied by her daughters (two of them) who supplements the history.  I have reviewed numerous records  made available to me.  Patient was hospitalized since our last visit at [facility] behavioral health because of delusions and hallucinations.  Patient was admitted from November 26, 2017 to December 06, 2017.  She was discharged on quetiapine, 25 mg at 8 AM and 5 PM and quetiapine 100 mg at bedtime.  The day of discharge, she became angry at a staff member and scratched them and therefore, Depakote, 125 mg 3 times per day was added with meals the day of discharge.  Daughters state that delusions and hallucinations are still present and agitation is there if she is fearful.  Her daughter had to give her a xanax the other day.  They are looking at alterative living situations as her family is  tired.  Still on rytary, 145 mg, 3 po tid.  Needs assistance dressing.  Could not do out patient therapy now.  Needs home therapy.    04/17/18 update: Patient is seen today in follow-up.  Patient is accompanied by her daughter who supplements history.  I have also reviewed records and talk to her primary care physician.  Patient has been hospitalized at [facility] behavioral health since our last visit.  Discharge summary records indicate that the patient was on Rytary 145 mg, 2 tablets 3 times per day at discharge.  Sertraline was started at 100 mg daily.  She was to discontinue her Xanax, Depakote, Exelon and Seroquel.  When I talked with Dr. Brigitte Pulse on Apr 10, 2018 he reported that after discharge, the patient's daughter had stopped all of her psych medications, and backed down Rytary to 2 to 3 capsules/day.  She did well for a time and then became agitated and confused.  Dr. Brigitte Pulse restarted her on Seroquel, 50 mg twice per day.  She was in the emergency room on Apr 13, 2018 as daughter was hoping to get SNF placement.  They were denied this due to lack of Medicaid/payment source.  They asked for psychiatric evaluation, but she was denied inpatient and they refused psychiatric medications.  She was taken home and hospice has evaluated at home.  PREVIOUS MEDICATIONS:  amantadine, primidone, carbidopa/levodopa, rytary  ALLERGIES:   Allergies  Allergen Reactions  . Aspirin Anaphylaxis    swelling  . Maxitrol [Neomycin-Polymyxin-Dexameth] Itching and Other (See Comments)    Redness to the eye    CURRENT MEDICATIONS:  Outpatient Encounter Medications as of 04/17/2018  Medication Sig  . Carbidopa-Levodopa ER (RYTARY) 36.25-145 MG CPCR Take 3 capsules by mouth 3 (three) times daily. (Patient taking differently: Take 1 capsule by mouth 3 (three) times daily. )  . docusate sodium (COLACE) 100 MG capsule Take 100 mg by mouth as needed for mild constipation.  . furosemide (LASIX) 20 MG tablet Take 20 mg by mouth as  needed.  Marland Kitchen LORazepam (ATIVAN) 1 MG tablet Take 1 tablet (1 mg total) by mouth every 6 (six) hours as needed for anxiety.  Marland Kitchen QUEtiapine (SEROQUEL) 25 MG tablet Take 50 mg by mouth 2 (two) times daily.   . sertraline (ZOLOFT) 100 MG tablet Take 100 mg by mouth daily.  . traZODone (DESYREL) 100 MG tablet Take 100 mg by mouth at bedtime.   No facility-administered encounter medications on file as of 04/17/2018.     PAST MEDICAL HISTORY:   Past Medical History:  Diagnosis Date  . Anxiety   . Coronary artery disease    Mid LAD Stent 2001 Patent By cath on 08/10/2010  . 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  Diabetes mellitus    Type II  . Dyslipidemia   . GERD (gastroesophageal reflux disease)   . HTN (hypertension)   . Parkinson disease ([place])     PAST SURGICAL HISTORY:   Past Surgical History:  Procedure Laterality Date  . APPENDECTOMY    . CARDIAC CATHETERIZATION  07/02/2000   normal LV function, 40-50% prox smooth LAD stenosis between 1st and 2nd diagonal, normal Cfx & RCA (Dr. Corky Downs)  . CARDIAC CATHETERIZATION  02/20/2001   normal LV function, 50% stenosis prox to prox 3.5x89mm Nir Elite stent, 80% stenosis in a 1st septal perforating artery (Dr. Corky Downs)  . CARDIAC CATHETERIZATION  06/18/2006   patent LAD stent, dominant Cfx, low-normal EF (Dr. Domenic Moras)  . CARDIAC CATHETERIZATION  08/10/2010   patent mid LAD stent, nonobstructive CAD (Dr. Alma Friendly)  . CHOLECYSTECTOMY  1963  . CORONARY ANGIOPLASTY WITH STENT PLACEMENT  10/31/2000   single vessel CAD (mild degree in mid-LAD 60% stenosis) - predilatation of plaque in mid LAD with cutting balloon angioplasty; 3.0x15mm Nir Elite stent to mid-LAD (Dr. Domenic Moras)  . HYSTERECTOMY  1989  . NM MYOCAR PERF WALL MOTION  2012   lexiscan - normal perfusion, EF 71%, low risk  . TRANSTHORACIC ECHOCARDIOGRAM  2012   EF 60-65%, mild AV regurg, LA mildly dilated, PA peak pressure 107mmHg    SOCIAL HISTORY:   Social History    Socioeconomic History  . Marital status: Widowed    Spouse name: Not on file  . Number of children: 3  . Years of education: 46  . Highest education level: Not on file  Occupational History  . Occupation: Retired    Comment: Metallurgist  Social Needs  . Financial resource strain: Not on file  . Food insecurity:    Worry: Not on file    Inability: Not on file  . Transportation needs:    Medical: Not on file    Non-medical: Not on file  Tobacco Use  . Smoking status: Never Smoker  . Smokeless tobacco: Never Used  Substance and Sexual Activity  . Alcohol use: No  . Drug use: No  . Sexual activity: Not on file  Lifestyle  . Physical activity:    Days per week: Not on file    Minutes per session: Not on file  . Stress: Not on file  Relationships  . Social connections:    Talks on phone: Not on file    Gets together: Not on file    Attends religious service: Not on file    Active member of club or organization: Not on file    Attends meetings of clubs or organizations: Not on file    Relationship status: Not on file  . Intimate partner violence:    Fear of current or ex partner: Not on file    Emotionally abused: Not on file    Physically abused: Not on file    Forced sexual activity: Not on file  Other Topics Concern  . Not on file  Social History Narrative   Lives with daughter [name] and son in law   Caffeine Use- 3 to 4 cups daily    FAMILY HISTORY:   Family Status  Relation Name Status  . Father  Deceased at age 29  . Mother  85       49yrs old  . Sister  Alive  . Cousin first x 2-sisters Deceased  . Other nephew Alive  . Daughter 2 Alive  .  Son  Alive    ROS:  A complete 10 system review of systems was obtained and was unremarkable apart from what is mentioned above.  PHYSICAL EXAMINATION:    VITALS:   There were no vitals filed for this visit.  GEN:  The patient appears stated age and is in NAD. HEENT:  Normocephalic, atraumatic.   The mucous membranes are moist. The superficial temporal arteries are without ropiness or tenderness. CV:  RRR Lungs:  CTAB Neck/HEME:  There are no carotid bruits bilaterally.  Neurological examination:  Orientation: The patient is alert and oriented to person and place.  She is confused and occasionally interjects things that don't make sense into the conversation.  MoCA deferred given just had neurocognitive testing performed. Cranial nerves: There is good facial symmetry. The speech is fluent and clear. Soft palate rises symmetrically and there is no tongue deviation. Hearing is intact to conversational tone. Sensation: Sensation is intact to light touch x 4 Motor: Strength is 5/5 in the bilateral upper and lower extremities.   Shoulder shrug is equal and symmetric.  There is no pronator drift.  Movement examination: Tone: There is normal tone today Abnormal movements: There is LUE resting tremor that is overall mild Coordination:  There is decremation with RAM's, with any form of RAMS, including alternating supination and pronation of the forearm, hand opening and closing, finger taps, heel taps and toe taps. Gait and Station: The patient pushes off the chair with her hands.  She is mildly unstable with her cane.  ASSESSMENT/PLAN:  1.  Idiopathic Parkinson's disease.  The patient was diagnosed in approximately 2013  -We discussed the diagnosis as well as pathophysiology of the disease.  We discussed treatment options as well as prognostic indicators.  Patient education was provided.  -We discussed that it used to be thought that levodopa would increase risk of melanoma but now it is believed that Parkinsons itself likely increases risk of melanoma. she is to get regular skin checks.  -She looks fairly optimally treated with rytary 145 mg, 3 tablets 3 times per day but she is having so many hallucinations that I will drop this a little to rytary, 145mg , 2 tablets 3 times per day.  She  takes the last one at bedtime.  When she tried to move that closer up in the day, she had tremor at night that was awakening her.  She will continue with this medication.  Risks, benefits, side effects and alternative therapies were discussed.  The opportunity to ask questions was given and they were answered to the best of my ability.  The patient expressed understanding and willingness to follow the outlined treatment protocols.  -I will send home therapy, PT/OT/social work to the home  -they are working with attorney to get medicaid to allow for SNF   2.  Parkinson's disease dementia with hallucinations  -Patient had neurocognitive testing in November, 2018.  This was consistent with mild to moderate dementia.  -I had a long discussion with patient and her daughter.  She is currently on quetiapine, 25 mg at 8 AM and 5 PM and 100 mg at night.  Daughters are very frustrated and patient not sleeping well.  They ask me multiple times to give her meds to help sleep better.  Increase seroquel to 25 mg at 8am/5pm and 150 mg at bedtime.  We did talk about the fact that the atypical antipsychotic medications are not indicated for dementia related psychosis and increased risk of mortality in  the elderly, usually because of infectious or  cardiac related. Understanding is expressed and they were agreeable that the benefits outweigh the risks in this case.  -The patient is also on Depakote, 125 mg 3 times per day.  The patient's quetiapine and Depakote were either started or altered by the psychiatrist that her most recent hospitalization.  Will increase VPA to 250 mg bid.   I expressed to the patient and her daughter that she needed psychiatric care and needed to be under the care of a psychiatrist, along with me.  Expressed the concept of team care with them.  -Patient is on the Exelon patch. She will continue that  3.  Anxiety and depression  -I think that counseling would be of value.  This has been  long-standing, according to records.    4.  ***   Cc:  [name], MD

## 2018-04-15 NOTE — Progress Notes (Signed)
    PALLIATIVE CARE CONSULT VISIT   PATIENT NAME: [name] DOB: Dec 11, 1941 MRN: [id number]  PRIMARY CARE PROVIDER: [name], MD  REFERRING PROVIDER: [name], MD [place], [place] [address]  RESPONSIBLE PARTY:   daughter   RECOMMENDATIONS and PLAN:  1. Weakness: secondary to acute decline in condition secondary to two falls last week, Parkinson's dementia with behaviors. Per daughter, she is choking on all pills or any foods. She is agitated all of the time and unable to ambulate without two person assist. She is confused and babbles nonsensical speech, which are all different conditions since my previous evaluation. She went to the ED over this past weekend for placement in SNF but this was not successful. PCP placed patient on Seroquel last Thursday but daughter reports that she is unable to swallow now. Daughter has been giving her mother IM ativan. She has the tablets in home. Advised that she make a paste vs giving IM injections. Recommending Hospice services. 2.Pain: grimaces when trying to turn patient. She moans in pain as I attempt to auscultate her posterior lung fields. Advised that daughter give her Tylenol suppositories until Hospice evaluation for pain. Daughter asking for morphine. As above, recommending Hospice services.  3. ACP: completed DNR form and MOST form which indicates comfort measures only.   I spent 40 minutes providing this consultation,  from 1:00pm to 1:40pm. More than 50% of the time in this consultation was spent coordinating communication.   HISTORY OF PRESENT ILLNESS:  [name] is a 77 y.o. female with Parkinson's dementia and behaviors. Daughter and PCP requested an evaluation post several falls and decline in function and cognition to move to hospice services.    CODE STATUS: DNR  PPS: 30% HOSPICE ELIGIBILITY/DIAGNOSIS: Parkinson's, Dementia with behaviors and aspiration  PAST MEDICAL HISTORY:  Past Medical History:    Diagnosis Date  . Anxiety   . Coronary artery disease    Mid LAD Stent 2001 Patent By cath on 08/10/2010  . Dementia in Parkinson's disease ([place])   . Diabetes mellitus    Type II  . Dyslipidemia   . GERD (gastroesophageal reflux disease)   . HTN (hypertension)   . Parkinson disease ([place])     SOCIAL HX:  Social History   Tobacco Use  . Smoking status: Never Smoker  . Smokeless tobacco: Never Used  Substance Use Topics  . Alcohol use: No    ALLERGIES:  Allergies  Allergen Reactions  . Aspirin Anaphylaxis    swelling  . Maxitrol [Neomycin-Polymyxin-Dexameth] Itching and Other (See Comments)    Redness to the eye    PHYSICAL EXAM:   General: Caucasian female lying in bed; trying to "taste" her blanket  Cardiovascular: RRR Pulmonary: CTAB Abdomen: soft, active BS Extremities: No edema: weak with standing Skin: thin and easily bruised Neurological: confused;agitated  Nathanial Rancher, NP

## 2018-04-16 ENCOUNTER — Telehealth: Payer: Self-pay | Admitting: Neurology

## 2018-04-16 ENCOUNTER — Other Ambulatory Visit: Payer: Self-pay

## 2018-04-16 DIAGNOSIS — K219 Gastro-esophageal reflux disease without esophagitis: Secondary | ICD-10-CM | POA: Diagnosis not present

## 2018-04-16 DIAGNOSIS — N390 Urinary tract infection, site not specified: Secondary | ICD-10-CM | POA: Diagnosis not present

## 2018-04-16 DIAGNOSIS — F419 Anxiety disorder, unspecified: Secondary | ICD-10-CM | POA: Diagnosis not present

## 2018-04-16 DIAGNOSIS — R1310 Dysphagia, unspecified: Secondary | ICD-10-CM | POA: Diagnosis not present

## 2018-04-16 DIAGNOSIS — I10 Essential (primary) hypertension: Secondary | ICD-10-CM | POA: Diagnosis not present

## 2018-04-16 DIAGNOSIS — E785 Hyperlipidemia, unspecified: Secondary | ICD-10-CM | POA: Diagnosis not present

## 2018-04-16 DIAGNOSIS — F0281 Dementia in other diseases classified elsewhere with behavioral disturbance: Secondary | ICD-10-CM | POA: Diagnosis not present

## 2018-04-16 DIAGNOSIS — E119 Type 2 diabetes mellitus without complications: Secondary | ICD-10-CM | POA: Diagnosis not present

## 2018-04-16 DIAGNOSIS — I25119 Atherosclerotic heart disease of native coronary artery with unspecified angina pectoris: Secondary | ICD-10-CM | POA: Diagnosis not present

## 2018-04-16 DIAGNOSIS — R634 Abnormal weight loss: Secondary | ICD-10-CM | POA: Diagnosis not present

## 2018-04-16 DIAGNOSIS — G20 Parkinson's disease: Secondary | ICD-10-CM | POA: Diagnosis not present

## 2018-04-16 NOTE — Telephone Encounter (Signed)
I have reviewed a number of pt records and talked with PCP.  Reviewed [facility] admission, ER [facility] eval, hospice eval.  Looks like she will be brought into hospice.  She doesn't need to keep appt tomorrow as hospice should be taking over this (unless there are new issues I don't know about).  Jade, please call daughter and update her (or have her update [place])

## 2018-04-16 NOTE — Patient Outreach (Signed)
[facility]) Care Management  04/16/2018  [name] 10-26-1941 [id number]   Patient visited by Palliative Care provider on yesterday.  Due to patient decline in status patient will be moved to hospice services at this time.    Plan: RN CM will close case as patient has been admitted to another program.  Jone Baseman, RN, MSN [place] [facility] Care Management Coordinator Direct Line [phone] Toll Free: [phone]  Fax: [phone]

## 2018-04-17 ENCOUNTER — Ambulatory Visit: Payer: Self-pay | Admitting: Neurology

## 2018-04-17 DIAGNOSIS — I25119 Atherosclerotic heart disease of native coronary artery with unspecified angina pectoris: Secondary | ICD-10-CM | POA: Diagnosis not present

## 2018-04-17 DIAGNOSIS — G20 Parkinson's disease: Secondary | ICD-10-CM | POA: Diagnosis not present

## 2018-04-17 DIAGNOSIS — R1310 Dysphagia, unspecified: Secondary | ICD-10-CM | POA: Diagnosis not present

## 2018-04-17 DIAGNOSIS — N390 Urinary tract infection, site not specified: Secondary | ICD-10-CM | POA: Diagnosis not present

## 2018-04-17 DIAGNOSIS — E119 Type 2 diabetes mellitus without complications: Secondary | ICD-10-CM | POA: Diagnosis not present

## 2018-04-17 DIAGNOSIS — F0281 Dementia in other diseases classified elsewhere with behavioral disturbance: Secondary | ICD-10-CM | POA: Diagnosis not present

## 2018-04-18 ENCOUNTER — Ambulatory Visit: Payer: Self-pay

## 2018-04-18 DIAGNOSIS — I25119 Atherosclerotic heart disease of native coronary artery with unspecified angina pectoris: Secondary | ICD-10-CM | POA: Diagnosis not present

## 2018-04-18 DIAGNOSIS — R1310 Dysphagia, unspecified: Secondary | ICD-10-CM | POA: Diagnosis not present

## 2018-04-18 DIAGNOSIS — F0281 Dementia in other diseases classified elsewhere with behavioral disturbance: Secondary | ICD-10-CM | POA: Diagnosis not present

## 2018-04-18 DIAGNOSIS — E119 Type 2 diabetes mellitus without complications: Secondary | ICD-10-CM | POA: Diagnosis not present

## 2018-04-18 DIAGNOSIS — N390 Urinary tract infection, site not specified: Secondary | ICD-10-CM | POA: Diagnosis not present

## 2018-04-18 DIAGNOSIS — G20 Parkinson's disease: Secondary | ICD-10-CM | POA: Diagnosis not present

## 2018-04-19 DIAGNOSIS — F0281 Dementia in other diseases classified elsewhere with behavioral disturbance: Secondary | ICD-10-CM | POA: Diagnosis not present

## 2018-04-19 DIAGNOSIS — I25119 Atherosclerotic heart disease of native coronary artery with unspecified angina pectoris: Secondary | ICD-10-CM | POA: Diagnosis not present

## 2018-04-19 DIAGNOSIS — N390 Urinary tract infection, site not specified: Secondary | ICD-10-CM | POA: Diagnosis not present

## 2018-04-19 DIAGNOSIS — R1310 Dysphagia, unspecified: Secondary | ICD-10-CM | POA: Diagnosis not present

## 2018-04-19 DIAGNOSIS — E119 Type 2 diabetes mellitus without complications: Secondary | ICD-10-CM | POA: Diagnosis not present

## 2018-04-19 DIAGNOSIS — G20 Parkinson's disease: Secondary | ICD-10-CM | POA: Diagnosis not present

## 2018-04-20 DIAGNOSIS — E119 Type 2 diabetes mellitus without complications: Secondary | ICD-10-CM | POA: Diagnosis not present

## 2018-04-20 DIAGNOSIS — R1310 Dysphagia, unspecified: Secondary | ICD-10-CM | POA: Diagnosis not present

## 2018-04-20 DIAGNOSIS — G20 Parkinson's disease: Secondary | ICD-10-CM | POA: Diagnosis not present

## 2018-04-20 DIAGNOSIS — N390 Urinary tract infection, site not specified: Secondary | ICD-10-CM | POA: Diagnosis not present

## 2018-04-20 DIAGNOSIS — I25119 Atherosclerotic heart disease of native coronary artery with unspecified angina pectoris: Secondary | ICD-10-CM | POA: Diagnosis not present

## 2018-04-20 DIAGNOSIS — F0281 Dementia in other diseases classified elsewhere with behavioral disturbance: Secondary | ICD-10-CM | POA: Diagnosis not present

## 2018-04-21 DIAGNOSIS — N390 Urinary tract infection, site not specified: Secondary | ICD-10-CM | POA: Diagnosis not present

## 2018-04-21 DIAGNOSIS — R1310 Dysphagia, unspecified: Secondary | ICD-10-CM | POA: Diagnosis not present

## 2018-04-21 DIAGNOSIS — F0281 Dementia in other diseases classified elsewhere with behavioral disturbance: Secondary | ICD-10-CM | POA: Diagnosis not present

## 2018-04-21 DIAGNOSIS — G20 Parkinson's disease: Secondary | ICD-10-CM | POA: Diagnosis not present

## 2018-04-21 DIAGNOSIS — E119 Type 2 diabetes mellitus without complications: Secondary | ICD-10-CM | POA: Diagnosis not present

## 2018-04-21 DIAGNOSIS — I25119 Atherosclerotic heart disease of native coronary artery with unspecified angina pectoris: Secondary | ICD-10-CM | POA: Diagnosis not present

## 2018-05-11 DEATH — deceased

## 2018-05-27 ENCOUNTER — Encounter: Payer: Self-pay | Admitting: Neurology

## 2018-05-27 ENCOUNTER — Encounter: Payer: Self-pay | Admitting: Diagnostic Neuroimaging
